# Patient Record
Sex: Female | Born: 1950
Health system: Southern US, Community
[De-identification: ages and names within clinical notes are randomized; demographics above are authoritative.]

## PROBLEM LIST (undated history)

## (undated) DIAGNOSIS — M51369 Other intervertebral disc degeneration, lumbar region without mention of lumbar back pain or lower extremity pain: Secondary | ICD-10-CM

## (undated) DIAGNOSIS — K219 Gastro-esophageal reflux disease without esophagitis: Secondary | ICD-10-CM

## (undated) DIAGNOSIS — K228 Other specified diseases of esophagus: Secondary | ICD-10-CM

## (undated) DIAGNOSIS — T7840XA Allergy, unspecified, initial encounter: Secondary | ICD-10-CM

## (undated) DIAGNOSIS — M5136 Other intervertebral disc degeneration, lumbar region: Secondary | ICD-10-CM

## (undated) DIAGNOSIS — E785 Hyperlipidemia, unspecified: Secondary | ICD-10-CM

## (undated) DIAGNOSIS — M810 Age-related osteoporosis without current pathological fracture: Secondary | ICD-10-CM

## (undated) HISTORY — DX: Allergy, unspecified, initial encounter: T78.40XA

## (undated) HISTORY — DX: Gastro-esophageal reflux disease without esophagitis: K21.9

## (undated) HISTORY — DX: Other specified diseases of esophagus: K22.8

## (undated) HISTORY — DX: Hyperlipidemia, unspecified: E78.5

## (undated) HISTORY — PX: EYE SURGERY: SHX253

## (undated) HISTORY — PX: NOSE SURGERY: SHX723

## (undated) HISTORY — DX: Other intervertebral disc degeneration, lumbar region: M51.36

## (undated) HISTORY — DX: Other intervertebral disc degeneration, lumbar region without mention of lumbar back pain or lower extremity pain: M51.369

## (undated) HISTORY — DX: Age-related osteoporosis without current pathological fracture: M81.0

---

## 2008-03-31 LAB — HM DEXA SCAN: HM Dexa Scan: NORMAL

## 2008-07-04 ENCOUNTER — Ambulatory Visit: Payer: Self-pay | Admitting: Gastroenterology

## 2008-07-04 LAB — HM COLONOSCOPY

## 2010-03-07 ENCOUNTER — Ambulatory Visit: Payer: Self-pay | Admitting: Family Medicine

## 2010-08-01 ENCOUNTER — Ambulatory Visit: Payer: Self-pay | Admitting: Family Medicine

## 2012-01-20 DIAGNOSIS — M5136 Other intervertebral disc degeneration, lumbar region: Secondary | ICD-10-CM | POA: Insufficient documentation

## 2012-02-04 LAB — HM MAMMOGRAPHY: HM Mammogram: NORMAL (ref 0–4)

## 2013-07-08 DIAGNOSIS — K2289 Other specified disease of esophagus: Secondary | ICD-10-CM

## 2013-07-08 HISTORY — DX: Other specified disease of esophagus: K22.89

## 2016-07-18 ENCOUNTER — Ambulatory Visit: Payer: Self-pay | Admitting: Family Medicine

## 2016-08-06 ENCOUNTER — Ambulatory Visit: Payer: Self-pay | Admitting: Family Medicine

## 2016-10-15 ENCOUNTER — Ambulatory Visit (INDEPENDENT_AMBULATORY_CARE_PROVIDER_SITE_OTHER): Payer: Medicare Other | Admitting: Family Medicine

## 2016-10-15 ENCOUNTER — Encounter: Payer: Self-pay | Admitting: Family Medicine

## 2016-10-15 VITALS — BP 136/82 | HR 60 | Temp 98.1°F | Ht 63.7 in | Wt 149.1 lb

## 2016-10-15 DIAGNOSIS — E782 Mixed hyperlipidemia: Secondary | ICD-10-CM | POA: Diagnosis not present

## 2016-10-15 DIAGNOSIS — H9202 Otalgia, left ear: Secondary | ICD-10-CM | POA: Diagnosis not present

## 2016-10-15 DIAGNOSIS — M810 Age-related osteoporosis without current pathological fracture: Secondary | ICD-10-CM | POA: Insufficient documentation

## 2016-10-15 DIAGNOSIS — L719 Rosacea, unspecified: Secondary | ICD-10-CM | POA: Insufficient documentation

## 2016-10-15 DIAGNOSIS — E785 Hyperlipidemia, unspecified: Secondary | ICD-10-CM | POA: Insufficient documentation

## 2016-10-15 DIAGNOSIS — F419 Anxiety disorder, unspecified: Secondary | ICD-10-CM

## 2016-10-15 DIAGNOSIS — B001 Herpesviral vesicular dermatitis: Secondary | ICD-10-CM | POA: Insufficient documentation

## 2016-10-15 DIAGNOSIS — K219 Gastro-esophageal reflux disease without esophagitis: Secondary | ICD-10-CM | POA: Diagnosis not present

## 2016-10-15 DIAGNOSIS — J309 Allergic rhinitis, unspecified: Secondary | ICD-10-CM | POA: Insufficient documentation

## 2016-10-15 NOTE — Assessment & Plan Note (Signed)
Stable on diet. Continue to monitor. Call with any concerns.

## 2016-10-15 NOTE — Assessment & Plan Note (Signed)
Stable with breathing exercises. Call with any concerns.

## 2016-10-15 NOTE — Assessment & Plan Note (Signed)
Not interested in oral meds right now. Continue to monitor.

## 2016-10-15 NOTE — Progress Notes (Signed)
BP 136/82 (BP Location: Left Arm, Patient Position: Sitting, Cuff Size: Normal)   Pulse 60   Temp 98.1 F (36.7 C)   Ht 5' 3.7" (1.618 m)   Wt 149 lb 1.6 oz (67.6 kg)   SpO2 98%   BMI 25.83 kg/m    Subjective:    Patient ID: Angie Copeland, female    DOB: 1951/03/04, 66 y.o.   MRN: [id number]  HPI: Angie Copeland is a 66 y.o. female who presents today to establish care.   Chief Complaint  Patient presents with  . New Patient (Initial Visit)    Patient will get records from previous doctors   EAR PAIN Duration: about a week Involved ear(s): left Severity:  mild  Quality:  sore Fever: no Otorrhea: no Upper respiratory infection symptoms: no Pruritus: no Hearing loss: no Water immersion no Using Q-tips: yes Recurrent otitis media: no Status: stable Treatments attempted: none  HYPERLIPIDEMIA Hyperlipidemia status: Stable Satisfied with current treatment?  yes Side effects:  Not on anything Past cholesterol meds: none Supplements: none Aspirin:  no Chest pain:  no Coronary artery disease:  no Family history CAD:  yes   Active Ambulatory Problems    Diagnosis Date Noted  . Rosacea 10/15/2016  . Allergic rhinitis 10/15/2016  . DDD (degenerative disc disease), lumbar 01/20/2012  . GE reflux 10/15/2016  . Hyperlipidemia, unspecified 10/15/2016  . Osteoporosis 10/15/2016  . Fever blister 10/15/2016  . Anxiety 10/15/2016   Resolved Ambulatory Problems    Diagnosis Date Noted  . No Resolved Ambulatory Problems   Past Medical History:  Diagnosis Date  . Allergy   . DDD (degenerative disc disease), lumbar   . GERD (gastroesophageal reflux disease)   . Hyperlipidemia   . Osteoporosis    No exam data present  No outpatient encounter prescriptions on file as of 10/15/2016.   No facility-administered encounter medications on file as of 10/15/2016.    Allergies  Allergen Reactions  . Penicillins Rash   Social History   Social History  . Marital status:  Married    Spouse name: N/A  . Number of children: N/A  . Years of education: N/A   Occupational History  . Not on file.   Social History Main Topics  . Smoking status: Former Smoker    Types: Cigarettes    Quit date: 07/09/1971  . Smokeless tobacco: Never Used  . Alcohol use 4.2 oz/week    7 Glasses of wine per week     Comment: 28oz of red wine a week  . Drug use: No  . Sexual activity: No   Other Topics Concern  . Not on file   Social History Narrative  . No narrative on file   Family History  Problem Relation Age of Onset  . Heart disease Mother   . Diabetes Mother   . Hiatal hernia Mother   . Cancer Father     Cancer  . Colitis Sister   . Irritable bowel syndrome Sister   . Arthritis Sister   . Diabetes Brother   . Stroke Maternal Grandmother   . Pneumonia Maternal Grandmother   . Kidney failure Paternal Grandmother   . Leukemia Paternal Grandfather   . Heart disease Sister   . Irregular heart beat Sister   . Thyroid disease Sister     Review of Systems  Constitutional: Negative.   HENT: Positive for ear pain. Negative for congestion, dental problem, drooling, ear discharge, facial swelling, hearing loss, mouth sores,  nosebleeds, postnasal drip, rhinorrhea, sinus pain, sinus pressure, sneezing, sore throat, tinnitus, trouble swallowing and voice change.   Respiratory: Negative.   Cardiovascular: Negative.   Psychiatric/Behavioral: Negative.     Per HPI unless specifically indicated above     Objective:    BP 136/82 (BP Location: Left Arm, Patient Position: Sitting, Cuff Size: Normal)   Pulse 60   Temp 98.1 F (36.7 C)   Ht 5' 3.7" (1.618 m)   Wt 149 lb 1.6 oz (67.6 kg)   SpO2 98%   BMI 25.83 kg/m   Wt Readings from Last 3 Encounters:  10/15/16 149 lb 1.6 oz (67.6 kg)    Physical Exam  Constitutional: She is oriented to person, place, and time. She appears well-developed and well-nourished. No distress.  HENT:  Head: Normocephalic and  atraumatic.  Right Ear: Hearing and external ear normal.  Left Ear: Hearing and external ear normal.  Nose: Nose normal.  Mouth/Throat: Oropharynx is clear and moist. No oropharyngeal exudate.  Fever blister on R side of her mouth, very dry EACs bilaterally   Eyes: Conjunctivae, EOM and lids are normal. Pupils are equal, round, and reactive to light. Right eye exhibits no discharge. Left eye exhibits no discharge. No scleral icterus.  Neck: Normal range of motion. Neck supple. No JVD present. No tracheal deviation present. No thyromegaly present.  Cardiovascular: Normal rate, regular rhythm, normal heart sounds and intact distal pulses.  Exam reveals no gallop and no friction rub.   No murmur heard. Pulmonary/Chest: Effort normal and breath sounds normal. No stridor. No respiratory distress. She has no wheezes. She has no rales. She exhibits no tenderness.  Musculoskeletal: Normal range of motion.  Lymphadenopathy:    She has no cervical adenopathy.  Neurological: She is alert and oriented to person, place, and time.  Skin: Skin is warm, dry and intact. No rash noted. She is not diaphoretic. No erythema. No pallor.  Psychiatric: She has a normal mood and affect. Her speech is normal and behavior is normal. Judgment and thought content normal. Cognition and memory are normal.  Nursing note and vitals reviewed.   No results found for this or any previous visit.    Assessment & Plan:   Problem List Items Addressed This Visit      Digestive   GE reflux    Stable on diet. Continue to monitor. Call with any concerns.       Fever blister    Not interested in oral meds right now. Continue to monitor.         Other   Hyperlipidemia, unspecified    Will obtain records from previous provider, continue diet and exercise. Call with any concerns.       Anxiety    Stable with breathing exercises. Call with any concerns.        Other Visit Diagnoses    Left ear pain    -  Primary    Very dry EAC, will use sweet oil. Call with any concerns.        Follow up plan: Return 2-3 months Wellness, for Records release please.

## 2016-10-15 NOTE — Patient Instructions (Addendum)
Food Choices for Gastroesophageal Reflux Disease, Adult When you have gastroesophageal reflux disease (GERD), the foods you eat and your eating habits are very important. Choosing the right foods can help ease the discomfort of GERD. Consider working with a diet and nutrition specialist (dietitian) to help you make healthy food choices. What general guidelines should I follow? Eating plan  Choose healthy foods low in fat, such as fruits, vegetables, whole grains, low-fat dairy products, and lean meat, fish, and poultry.  Eat frequent, small meals instead of three large meals each day. Eat your meals slowly, in a relaxed setting. Avoid bending over or lying down until 2-3 hours after eating.  Limit high-fat foods such as fatty meats or fried foods.  Limit your intake of oils, butter, and shortening to less than 8 teaspoons each day.  Avoid the following: ? Foods that cause symptoms. These may be different for different people. Keep a food diary to keep track of foods that cause symptoms. ? Alcohol. ? Drinking large amounts of liquid with meals. ? Eating meals during the 2-3 hours before bed.  Cook foods using methods other than frying. This may include baking, grilling, or broiling. Lifestyle   Maintain a healthy weight. Ask your health care provider what weight is healthy for you. If you need to lose weight, work with your health care provider to do so safely.  Exercise for at least 30 minutes on 5 or more days each week, or as told by your health care provider.  Avoid wearing clothes that fit tightly around your waist and chest.  Do not use any products that contain nicotine or tobacco, such as cigarettes and e-cigarettes. If you need help quitting, ask your health care provider.  Sleep with the head of your bed raised. Use a wedge under the mattress or blocks under the bed frame to raise the head of the bed. What foods are not recommended? The items listed may not be a complete  list. Talk with your dietitian about what dietary choices are best for you. Grains Pastries or quick breads with added fat. French toast. Vegetables Deep fried vegetables. French fries. Any vegetables prepared with added fat. Any vegetables that cause symptoms. For some people this may include tomatoes and tomato products, chili peppers, onions and garlic, and horseradish. Fruits Any fruits prepared with added fat. Any fruits that cause symptoms. For some people this may include citrus fruits, such as oranges, grapefruit, pineapple, and lemons. Meats and other protein foods High-fat meats, such as fatty beef or pork, hot dogs, ribs, ham, sausage, salami and bacon. Fried meat or protein, including fried fish and fried chicken. Nuts and nut butters. Dairy Whole milk and chocolate milk. Sour cream. Cream. Ice cream. Cream cheese. Milk shakes. Beverages Coffee and tea, with or without caffeine. Carbonated beverages. Sodas. Energy drinks. Fruit juice made with acidic fruits (such as orange or grapefruit). Tomato juice. Alcoholic drinks. Fats and oils Butter. Margarine. Shortening. Ghee. Sweets and desserts Chocolate and cocoa. Donuts. Seasoning and other foods Pepper. Peppermint and spearmint. Any condiments, herbs, or seasonings that cause symptoms. For some people, this may include curry, hot sauce, or vinegar-based salad dressings. Summary  When you have gastroesophageal reflux disease (GERD), food and lifestyle choices are very important to help ease the discomfort of GERD.  Eat frequent, small meals instead of three large meals each day. Eat your meals slowly, in a relaxed setting. Avoid bending over or lying down until 2-3 hours after eating.  Limit high-fat   foods such as fatty meat or fried foods. This information is not intended to replace advice given to you by your health care provider. Make sure you discuss any questions you have with your health care provider. Document Released:  06/24/2005 Document Revised: 06/25/2016 Document Reviewed: 06/25/2016 Elsevier Interactive Patient Education  2017 [employer]  

## 2016-10-15 NOTE — Assessment & Plan Note (Signed)
Will obtain records from previous provider, continue diet and exercise. Call with any concerns.

## 2016-11-21 ENCOUNTER — Encounter: Payer: Self-pay | Admitting: Family Medicine

## 2016-11-21 ENCOUNTER — Telehealth: Payer: Self-pay | Admitting: Family Medicine

## 2016-11-21 NOTE — Telephone Encounter (Signed)
Called pt to schedule Annual Wellness Visit with Nurse Health Advisor for this year she just had established care appt in April declined AWV for this year:  - knb

## 2016-12-17 ENCOUNTER — Encounter: Payer: Self-pay | Admitting: Family Medicine

## 2016-12-17 ENCOUNTER — Ambulatory Visit (INDEPENDENT_AMBULATORY_CARE_PROVIDER_SITE_OTHER): Payer: Medicare Other | Admitting: Family Medicine

## 2016-12-17 VITALS — BP 144/75 | HR 67 | Temp 97.8°F | Ht 64.1 in | Wt 147.7 lb

## 2016-12-17 DIAGNOSIS — F419 Anxiety disorder, unspecified: Secondary | ICD-10-CM

## 2016-12-17 DIAGNOSIS — Z7189 Other specified counseling: Secondary | ICD-10-CM

## 2016-12-17 DIAGNOSIS — K219 Gastro-esophageal reflux disease without esophagitis: Secondary | ICD-10-CM | POA: Diagnosis not present

## 2016-12-17 DIAGNOSIS — R252 Cramp and spasm: Secondary | ICD-10-CM

## 2016-12-17 DIAGNOSIS — Z0000 Encounter for general adult medical examination without abnormal findings: Secondary | ICD-10-CM | POA: Diagnosis not present

## 2016-12-17 DIAGNOSIS — M810 Age-related osteoporosis without current pathological fracture: Secondary | ICD-10-CM | POA: Diagnosis not present

## 2016-12-17 DIAGNOSIS — Z23 Encounter for immunization: Secondary | ICD-10-CM

## 2016-12-17 DIAGNOSIS — Z1239 Encounter for other screening for malignant neoplasm of breast: Secondary | ICD-10-CM

## 2016-12-17 DIAGNOSIS — Z1231 Encounter for screening mammogram for malignant neoplasm of breast: Secondary | ICD-10-CM

## 2016-12-17 DIAGNOSIS — E782 Mixed hyperlipidemia: Secondary | ICD-10-CM

## 2016-12-17 LAB — UA/M W/RFLX CULTURE, ROUTINE
BILIRUBIN UA: NEGATIVE
GLUCOSE, UA: NEGATIVE
Ketones, UA: NEGATIVE
Leukocytes, UA: NEGATIVE
Nitrite, UA: NEGATIVE
PROTEIN UA: NEGATIVE
RBC UA: NEGATIVE
Specific Gravity, UA: <1.005 — ABNORMAL LOW (ref 1.005–1.030)
UUROB: 0.2 mg/dL (ref 0.2–1.0)
pH, UA: 5.5 (ref 5.0–7.5)

## 2016-12-17 LAB — MICROSCOPIC EXAMINATION
Bacteria, UA: NONE SEEN
RBC MICROSCOPIC, UA: NONE SEEN /HPF (ref 0–?)

## 2016-12-17 NOTE — Assessment & Plan Note (Signed)
Will recheck her DEXA as the last one was in 2009. Ordered today.

## 2016-12-17 NOTE — Assessment & Plan Note (Signed)
Rechecking levels today. Await results. Will treat as needed.  

## 2016-12-17 NOTE — Progress Notes (Signed)
BP (!) 144/75 (BP Location: Left Arm, Patient Position: Sitting, Cuff Size: Normal)   Pulse 67   Temp 97.8 F (36.6 C)   Ht 5' 4.1" (1.628 m)   Wt 147 lb 11.2 oz (67 kg)   SpO2 100%   BMI 25.27 kg/m    Subjective:    Patient ID: Angie Copeland, female    DOB: 07/04/1951, 66 y.o.   MRN: [id number]  HPI: Angie Copeland is a 66 y.o. female presenting on 12/17/2016 for comprehensive medical examination patient. Current medical complaints include:  LEG CRAMPS- sometimes wakes up in the night and has leg cramps, sometimes it happens during the day. She notes that she has a bunion and wart on one foot and isn't sure if her walk has been normal Duration: 8 weeks off and on- has happened about 5 times in the last 2 months Pain: yes Severity: severe  Quality:  cramping Location: L lower leg  Bilateral:  no Onset: sudden Frequency: intermittent Time of  day:   at random Sudden unintentional leg jerking:   no Paresthesias:   yes- only in her hands, not in her leg Decreased sensation:  no Weakness:   no Insomnia:   no Fatigue:   no Alleviating factors: walking, moving Aggravating factors: unknown Status: fluctuating Treatments attempted: walking- usually takes a couple of minutes to go away  She currently lives with: alone Menopausal Symptoms: no  Functional Status Survey: Is the patient deaf or have difficulty hearing?: Yes (when in a crowd, or a lot of noise) Does the patient have difficulty seeing, even when wearing glasses/contacts?: Yes (Patient is going to go see her eye doctor) Does the patient have difficulty concentrating, remembering, or making decisions?: No Does the patient have difficulty walking or climbing stairs?: No Does the patient have difficulty dressing or bathing?: No Does the patient have difficulty doing errands alone such as visiting a doctor's office or shopping?: No  Fall Risk  12/17/2016 10/15/2016  Falls in the past year? Yes No  Number falls in past yr:  1 -  Injury with Fall? No -    Depression Screen Depression screen [facility] 2/9 12/17/2016 10/15/2016  Decreased Interest 0 0  Down, Depressed, Hopeless 0 0  PHQ - 2 Score 0 0   Advanced Directives Does patient have a HCPOA?    yes Does patient have a living will or MOST form?  yes  Past Medical History:  Past Medical History:  Diagnosis Date  . Allergy   . DDD (degenerative disc disease), lumbar   . Esophageal mass 2015   Found incidently on CXR- Saw ENT and for visualization and nothing was there.   Marland Kitchen GERD (gastroesophageal reflux disease)   . Hyperlipidemia   . Osteoporosis     Surgical History:  Past Surgical History:  Procedure Laterality Date  . NOSE SURGERY     Cartilage removed from left side of nose    Medications:  No current outpatient prescriptions on file prior to visit.   No current facility-administered medications on file prior to visit.     Allergies:  Allergies  Allergen Reactions  . Penicillins Rash    Social History:  Social History   Social History  . Marital status: Married    Spouse name: N/A  . Number of children: N/A  . Years of education: N/A   Occupational History  . Not on file.   Social History Main Topics  . Smoking status: Former Smoker  Types: Cigarettes    Quit date: 07/09/1971  . Smokeless tobacco: Never Used  . Alcohol use 4.2 oz/week    7 Glasses of wine per week     Comment: 28oz of red wine a week  . Drug use: No  . Sexual activity: No   Other Topics Concern  . Not on file   Social History Narrative  . No narrative on file   History  Smoking Status  . Former Smoker  . Types: Cigarettes  . Quit date: 07/09/1971  Smokeless Tobacco  . Never Used   History  Alcohol Use  . 4.2 oz/week  . 7 Glasses of wine per week    Comment: 28oz of red wine a week    Family History:  Family History  Problem Relation Age of Onset  . Heart disease Mother   . Diabetes Mother   . Hiatal hernia Mother   . Arthritis  Mother   . Cancer Father 65       Colon  . Colitis Sister   . Irritable bowel syndrome Sister   . Arthritis Sister   . Diabetes Brother   . Stroke Maternal Grandmother   . Pneumonia Maternal Grandmother   . Kidney failure Paternal Grandmother   . Leukemia Paternal Grandfather   . Cancer Paternal Grandfather        Leukemia  . Heart disease Sister   . Irregular heart beat Sister   . Thyroid disease Sister    Past medical history, surgical history, medications, allergies, family history and social history reviewed with patient today and changes made to appropriate areas of the chart.   Review of Systems  Constitutional: Negative.   HENT: Positive for hearing loss. Negative for congestion, ear discharge, ear pain, nosebleeds, sinus pain, sore throat and tinnitus.   Eyes: Positive for blurred vision (foggy when she first wakes up in the AM, better when she puts her contacts in. ). Negative for double vision, photophobia, pain, discharge and redness.  Respiratory: Negative.  Negative for stridor.   Cardiovascular: Negative.   Gastrointestinal: Positive for diarrhea and heartburn (with dietary indiscretion). Negative for abdominal pain, blood in stool, constipation, melena, nausea and vomiting.  Genitourinary: Negative.   Musculoskeletal: Negative.   Skin: Negative.   Neurological: Positive for tingling. Negative for dizziness, tremors, sensory change, speech change, focal weakness, seizures, loss of consciousness and headaches.  Endo/Heme/Allergies: Positive for environmental allergies. Negative for polydipsia. Bruises/bleeds easily.  Psychiatric/Behavioral: Negative for depression, hallucinations, memory loss, substance abuse and suicidal ideas. The patient is nervous/anxious. The patient does not have insomnia.     All other ROS negative except what is listed above and in the HPI.      Objective:    BP (!) 144/75 (BP Location: Left Arm, Patient Position: Sitting, Cuff Size:  Normal)   Pulse 67   Temp 97.8 F (36.6 C)   Ht 5' 4.1" (1.628 m)   Wt 147 lb 11.2 oz (67 kg)   SpO2 100%   BMI 25.27 kg/m   Wt Readings from Last 3 Encounters:  12/17/16 147 lb 11.2 oz (67 kg)  10/15/16 149 lb 1.6 oz (67.6 kg)     Hearing Screening   125Hz  250Hz  500Hz  1000Hz  2000Hz  3000Hz  4000Hz  6000Hz  8000Hz   Right ear:   20 20 20  20     Left ear:   20 20 20  20       Visual Acuity Screening   Right eye Left eye Both eyes  Without  correction:     With correction: 20/50 20/200 20/40    Physical Exam  Constitutional: She is oriented to person, place, and time. She appears well-developed and well-nourished. No distress.  HENT:  Head: Normocephalic and atraumatic.  Right Ear: Hearing, tympanic membrane, external ear and ear canal normal.  Left Ear: Hearing, tympanic membrane, external ear and ear canal normal.  Nose: Nose normal.  Mouth/Throat: Uvula is midline, oropharynx is clear and moist and mucous membranes are normal. No oropharyngeal exudate.  Eyes: Conjunctivae, EOM and lids are normal. Pupils are equal, round, and reactive to light. Right eye exhibits no discharge. Left eye exhibits no discharge. No scleral icterus.  Neck: Normal range of motion. Neck supple. No JVD present. No tracheal deviation present. No thyromegaly present.  Cardiovascular: Normal rate, regular rhythm, normal heart sounds and intact distal pulses.  Exam reveals no gallop and no friction rub.   No murmur heard. Pulmonary/Chest: Effort normal and breath sounds normal. No stridor. No respiratory distress. She has no wheezes. She has no rales. She exhibits no tenderness. Right breast exhibits no inverted nipple, no mass, no nipple discharge, no skin change and no tenderness. Left breast exhibits no inverted nipple, no mass, no nipple discharge, no skin change and no tenderness. Breasts are symmetrical.  Abdominal: Soft. Bowel sounds are normal. She exhibits no distension and no mass. There is no  tenderness. There is no rebound and no guarding.  Genitourinary:  Genitourinary Comments: Pelvic exam deferred with shared decision making  Musculoskeletal: Normal range of motion. She exhibits no edema, tenderness or deformity.  Lymphadenopathy:    She has no cervical adenopathy.  Neurological: She is alert and oriented to person, place, and time. She has normal reflexes. She displays normal reflexes. No cranial nerve deficit. She exhibits normal muscle tone. Coordination normal.  Skin: Skin is warm, dry and intact. No rash noted. She is not diaphoretic. No erythema. No pallor.  Psychiatric: She has a normal mood and affect. Her speech is normal and behavior is normal. Judgment and thought content normal. Cognition and memory are normal.  Nursing note and vitals reviewed.  6CIT Screen 12/17/2016  What Year? 0 points  What month? 0 points  What time? 0 points  Count back from 20 2 points  Months in reverse 0 points  Repeat phrase 0 points  Total Score 2    Results for orders placed or performed in visit on 11/21/16  HM MAMMOGRAPHY  Result Value Ref Range   HM Mammogram Self Reported Normal 0-4 Bi-Rad, Self Reported Normal  HM DEXA SCAN  Result Value Ref Range   HM Dexa Scan Normal   HM COLONOSCOPY  Result Value Ref Range   HM Colonoscopy Patient Reported See Report (in chart), Patient Reported      Assessment & Plan:   Problem List Items Addressed This Visit      Digestive   GE reflux    Stable- only with dietary indiscretions. Call with any concerns.       Relevant Orders   CBC with Differential/Platelet   Comprehensive metabolic panel   UA/M w/rflx Culture, Routine     Musculoskeletal and Integument   Osteoporosis    Will recheck her DEXA as the last one was in 2009. Ordered today.      Relevant Orders   Comprehensive metabolic panel   TSH   UA/M w/rflx Culture, Routine   VITAMIN D 25 Hydroxy (Vit-D Deficiency, Fractures)   DG Bone Density  Other    Hyperlipidemia, unspecified    Rechecking levels today. Await results. Will treat as needed.       Relevant Orders   Comprehensive metabolic panel   Lipid Panel w/o Chol/HDL Ratio   UA/M w/rflx Culture, Routine   Anxiety    Stable. Continue breathing exercises. Call with any concerns.       Relevant Orders   Comprehensive metabolic panel   TSH   UA/M w/rflx Culture, Routine   Advance directive discussed with patient    Has one on file. Will bring in a copy so we can put it on file.        Other Visit Diagnoses    Medicare annual wellness visit, subsequent    -  Primary   Preventative care discussed today as below.    Routine general medical examination at a health care facility       Vaccines updated. Screening labs checked today. Pap N/A. Mammogram and DEXA ordered today. Colonoscopy up to date. Continue diet and exercise.    Screening for breast cancer       Mammogram ordered today. Await results.    Relevant Orders   MM DIGITAL SCREENING BILATERAL   Immunization due       Prevnar given today, Pneumovax due in 1 year.    Leg cramps       Will check labs today. Increase water intake. Call with any concerns. Await results.       Preventative Services:  Health Risk Assessment and Personalized Prevention Plan: Done today Bone Mass Measurements: Ordered today Breast Cancer Screening: Ordered today CVD Screening: done today Cervical Cancer Screening: N/A Colon Cancer Screening: Up to date Depression Screening: Done today Diabetes Screening: Done today  Glaucoma Screening: See your Eye Doctor Hepatitis B vaccine: N/A Hepatitis C screening: Declined HIV Screening: Declined Flu Vaccine: Get in October Lung cancer Screening: N/A Obesity Screening: Done today Pneumonia Vaccines (2): Prevnar given today. Pneumovax due in 1 year STI Screening: N/A  Follow up plan: Return in about 1 year (around 12/17/2017) for Wellness.   LABORATORY TESTING:  - Pap smear: not  applicable  IMMUNIZATIONS:   - Tdap: Tetanus vaccination status reviewed: last tetanus booster within 10 years. - Influenza: Up to date - Pneumovax: Not applicable - Prevnar: Administered today  SCREENING: -Mammogram: Ordered today  - Colonoscopy: Up to date  - Bone Density: Ordered today  -Hearing Test: Refused   PATIENT COUNSELING:   Advised to take 1 mg of folate supplement per day if capable of pregnancy.   Sexuality: Discussed sexually transmitted diseases, partner selection, use of condoms, avoidance of unintended pregnancy  and contraceptive alternatives.   Advised to avoid cigarette smoking.  I discussed with the patient that most people either abstain from alcohol or drink within safe limits (<=14/week and <=4 drinks/occasion for males, <=7/weeks and <= 3 drinks/occasion for females) and that the risk for alcohol disorders and other health effects rises proportionally with the number of drinks per week and how often a drinker exceeds daily limits.  Discussed cessation/primary prevention of drug use and availability of treatment for abuse.   Diet: Encouraged to adjust caloric intake to maintain  or achieve ideal body weight, to reduce intake of dietary saturated fat and total fat, to limit sodium intake by avoiding high sodium foods and not adding table salt, and to maintain adequate dietary potassium and calcium preferably from fresh fruits, vegetables, and low-fat dairy products.    stressed the importance of  regular exercise  Injury prevention: Discussed safety belts, safety helmets, smoke detector, smoking near bedding or upholstery.   Dental health: Discussed importance of regular tooth brushing, flossing, and dental visits.    NEXT PREVENTATIVE PHYSICAL DUE IN 1 YEAR. Return in about 1 year (around 12/17/2017) for Wellness.

## 2016-12-17 NOTE — Assessment & Plan Note (Signed)
Has one on file. Will bring in a copy so we can put it on file.

## 2016-12-17 NOTE — Patient Instructions (Addendum)
Preventative Services:  Health Risk Assessment and Personalized Prevention Plan: Done today Bone Mass Measurements: Ordered today Breast Cancer Screening: Ordered today CVD Screening: done today Cervical Cancer Screening: N/A Colon Cancer Screening: Up to date Depression Screening: Done today Diabetes Screening: Done today  Glaucoma Screening: See your Eye Doctor Hepatitis B vaccine: N/A Hepatitis C screening: Declined HIV Screening: Declined Flu Vaccine: Get in October Lung cancer Screening: N/A Obesity Screening: Done today Pneumonia Vaccines (2): Prevnar given today. Pneumovax due in 1 year STI Screening: N/A Health Maintenance, Female Adopting a healthy lifestyle and getting preventive care can go a long way to promote health and wellness. Talk with your health care provider about what schedule of regular examinations is right for you. This is a good chance for you to check in with your provider about disease prevention and staying healthy. In between checkups, there are plenty of things you can do on your own. Experts have done a lot of research about which lifestyle changes and preventive measures are most likely to keep you healthy. Ask your health care provider for more information. Weight and diet Eat a healthy diet  Be sure to include plenty of vegetables, fruits, low-fat dairy products, and lean protein.  Do not eat a lot of foods high in solid fats, added sugars, or salt.  Get regular exercise. This is one of the most important things you can do for your health. ? Most adults should exercise for at least 150 minutes each week. The exercise should increase your heart rate and make you sweat (moderate-intensity exercise). ? Most adults should also do strengthening exercises at least twice a week. This is in addition to the moderate-intensity exercise.  Maintain a healthy weight  Body mass index (BMI) is a measurement that can be used to identify possible weight problems.  It estimates body fat based on height and weight. Your health care provider can help determine your BMI and help you achieve or maintain a healthy weight.  For females 21 years of age and older: ? A BMI below 18.5 is considered underweight. ? A BMI of 18.5 to 24.9 is normal. ? A BMI of 25 to 29.9 is considered overweight. ? A BMI of 30 and above is considered obese.  Watch levels of cholesterol and blood lipids  You should start having your blood tested for lipids and cholesterol at 67 years of age, then have this test every 5 years.  You may need to have your cholesterol levels checked more often if: ? Your lipid or cholesterol levels are high. ? You are older than 68 years of age. ? You are at high risk for heart disease.  Cancer screening Lung Cancer  Lung cancer screening is recommended for adults 44-29 years old who are at high risk for lung cancer because of a history of smoking.  A yearly low-dose CT scan of the lungs is recommended for people who: ? Currently smoke. ? Have quit within the past 15 years. ? Have at least a 30-pack-year history of smoking. A pack year is smoking an average of one pack of cigarettes a day for 1 year.  Yearly screening should continue until it has been 15 years since you quit.  Yearly screening should stop if you develop a health problem that would prevent you from having lung cancer treatment.  Breast Cancer  Practice breast self-awareness. This means understanding how your breasts normally appear and feel.  It also means doing regular breast self-exams. Let your health  care provider know about any changes, no matter how small.  If you are in your 20s or 30s, you should have a clinical breast exam (CBE) by a health care provider every 1-3 years as part of a regular health exam.  If you are 45 or older, have a CBE every year. Also consider having a breast X-ray (mammogram) every year.  If you have a family history of breast cancer, talk to  your health care provider about genetic screening.  If you are at high risk for breast cancer, talk to your health care provider about having an MRI and a mammogram every year.  Breast cancer gene (BRCA) assessment is recommended for women who have family members with BRCA-related cancers. BRCA-related cancers include: ? Breast. ? Ovarian. ? Tubal. ? Peritoneal cancers.  Results of the assessment will determine the need for genetic counseling and BRCA1 and BRCA2 testing.  Cervical Cancer Your health care provider may recommend that you be screened regularly for cancer of the pelvic organs (ovaries, uterus, and vagina). This screening involves a pelvic examination, including checking for microscopic changes to the surface of your cervix (Pap test). You may be encouraged to have this screening done every 3 years, beginning at age 55.  For women ages 9-65, health care providers may recommend pelvic exams and Pap testing every 3 years, or they may recommend the Pap and pelvic exam, combined with testing for human papilloma virus (HPV), every 5 years. Some types of HPV increase your risk of cervical cancer. Testing for HPV may also be done on women of any age with unclear Pap test results.  Other health care providers may not recommend any screening for nonpregnant women who are considered low risk for pelvic cancer and who do not have symptoms. Ask your health care provider if a screening pelvic exam is right for you.  If you have had past treatment for cervical cancer or a condition that could lead to cancer, you need Pap tests and screening for cancer for at least 20 years after your treatment. If Pap tests have been discontinued, your risk factors (such as having a new sexual partner) need to be reassessed to determine if screening should resume. Some women have medical problems that increase the chance of getting cervical cancer. In these cases, your health care provider may recommend more  frequent screening and Pap tests.  Colorectal Cancer  This type of cancer can be detected and often prevented.  Routine colorectal cancer screening usually begins at 68 years of age and continues through 68 years of age.  Your health care provider may recommend screening at an earlier age if you have risk factors for colon cancer.  Your health care provider may also recommend using home test kits to check for hidden blood in the stool.  A small camera at the end of a tube can be used to examine your colon directly (sigmoidoscopy or colonoscopy). This is done to check for the earliest forms of colorectal cancer.  Routine screening usually begins at age 73.  Direct examination of the colon should be repeated every 5-10 years through 68 years of age. However, you may need to be screened more often if early forms of precancerous polyps or small growths are found.  Skin Cancer  Check your skin from head to toe regularly.  Tell your health care provider about any new moles or changes in moles, especially if there is a change in a mole's shape or color.  Also tell  your health care provider if you have a mole that is larger than the size of a pencil eraser.  Always use sunscreen. Apply sunscreen liberally and repeatedly throughout the day.  Protect yourself by wearing long sleeves, pants, a wide-brimmed hat, and sunglasses whenever you are outside.  Heart disease, diabetes, and high blood pressure  High blood pressure causes heart disease and increases the risk of stroke. High blood pressure is more likely to develop in: ? People who have blood pressure in the high end of the normal range (130-139/85-89 mm Hg). ? People who are overweight or obese. ? People who are African American.  If you are 66-32 years of age, have your blood pressure checked every 3-5 years. If you are 1 years of age or older, have your blood pressure checked every year. You should have your blood pressure measured  twice-once when you are at a hospital or clinic, and once when you are not at a hospital or clinic. Record the average of the two measurements. To check your blood pressure when you are not at a hospital or clinic, you can use: ? An automated blood pressure machine at a pharmacy. ? A home blood pressure monitor.  If you are between 51 years and 56 years old, ask your health care provider if you should take aspirin to prevent strokes.  Have regular diabetes screenings. This involves taking a blood sample to check your fasting blood sugar level. ? If you are at a normal weight and have a low risk for diabetes, have this test once every three years after 68 years of age. ? If you are overweight and have a high risk for diabetes, consider being tested at a younger age or more often. Preventing infection Hepatitis B  If you have a higher risk for hepatitis B, you should be screened for this virus. You are considered at high risk for hepatitis B if: ? You were born in a country where hepatitis B is common. Ask your health care provider which countries are considered high risk. ? Your parents were born in a high-risk country, and you have not been immunized against hepatitis B (hepatitis B vaccine). ? You have HIV or AIDS. ? You use needles to inject street drugs. ? You live with someone who has hepatitis B. ? You have had sex with someone who has hepatitis B. ? You get hemodialysis treatment. ? You take certain medicines for conditions, including cancer, organ transplantation, and autoimmune conditions.  Hepatitis C  Blood testing is recommended for: ? Everyone born from [birth year] through 1967. ? Anyone with known risk factors for hepatitis C.  Sexually transmitted infections (STIs)  You should be screened for sexually transmitted infections (STIs) including gonorrhea and chlamydia if: ? You are sexually active and are younger than 68 years of age. ? You are older than 68 years of age and your  health care provider tells you that you are at risk for this type of infection. ? Your sexual activity has changed since you were last screened and you are at an increased risk for chlamydia or gonorrhea. Ask your health care provider if you are at risk.  If you do not have HIV, but are at risk, it may be recommended that you take a prescription medicine daily to prevent HIV infection. This is called pre-exposure prophylaxis (PrEP). You are considered at risk if: ? You are sexually active and do not regularly use condoms or know the HIV status of your partner(s). ?  You take drugs by injection. ? You are sexually active with a partner who has HIV.  Talk with your health care provider about whether you are at high risk of being infected with HIV. If you choose to begin PrEP, you should first be tested for HIV. You should then be tested every 3 months for as long as you are taking PrEP. Pregnancy  If you are premenopausal and you may become pregnant, ask your health care provider about preconception counseling.  If you may become pregnant, take 400 to 800 micrograms (mcg) of folic acid every day.  If you want to prevent pregnancy, talk to your health care provider about birth control (contraception). Osteoporosis and menopause  Osteoporosis is a disease in which the bones lose minerals and strength with aging. This can result in serious bone fractures. Your risk for osteoporosis can be identified using a bone density scan.  If you are 43 years of age or older, or if you are at risk for osteoporosis and fractures, ask your health care provider if you should be screened.  Ask your health care provider whether you should take a calcium or vitamin D supplement to lower your risk for osteoporosis.  Menopause may have certain physical symptoms and risks.  Hormone replacement therapy may reduce some of these symptoms and risks. Talk to your health care provider about whether hormone replacement  therapy is right for you. Follow these instructions at home:  Schedule regular health, dental, and eye exams.  Stay current with your immunizations.  Do not use any tobacco products including cigarettes, chewing tobacco, or electronic cigarettes.  If you are pregnant, do not drink alcohol.  If you are breastfeeding, limit how much and how often you drink alcohol.  Limit alcohol intake to no more than 1 drink per day for nonpregnant women. One drink equals 12 ounces of beer, 5 ounces of wine, or 1 ounces of hard liquor.  Do not use street drugs.  Do not share needles.  Ask your health care provider for help if you need support or information about quitting drugs.  Tell your health care provider if you often feel depressed.  Tell your health care provider if you have ever been abused or do not feel safe at home. This information is not intended to replace advice given to you by your health care provider. Make sure you discuss any questions you have with your health care provider. Document Released: 01/07/2011 Document Revised: 11/30/2015 Document Reviewed: 03/28/2015 Elsevier Interactive Patient Education  2018 [place] Maintenance for Postmenopausal Women Menopause is a normal process in which your reproductive ability comes to an end. This process happens gradually over a span of months to years, usually between the ages of 45 and 20. Menopause is complete when you have missed 12 consecutive menstrual periods. It is important to talk with your health care provider about some of the most common conditions that affect postmenopausal women, such as heart disease, cancer, and bone loss (osteoporosis). Adopting a healthy lifestyle and getting preventive care can help to promote your health and wellness. Those actions can also lower your chances of developing some of these common conditions. What should I know about menopause? During menopause, you may experience a number of  symptoms, such as:  Moderate-to-severe hot flashes.  Night sweats.  Decrease in sex drive.  Mood swings.  Headaches.  Tiredness.  Irritability.  Memory problems.  Insomnia.  Choosing to treat or not to treat menopausal changes is  an individual decision that you make with your health care provider. What should I know about hormone replacement therapy and supplements? Hormone therapy products are effective for treating symptoms that are associated with menopause, such as hot flashes and night sweats. Hormone replacement carries certain risks, especially as you become older. If you are thinking about using estrogen or estrogen with progestin treatments, discuss the benefits and risks with your health care provider. What should I know about heart disease and stroke? Heart disease, heart attack, and stroke become more likely as you age. This may be due, in part, to the hormonal changes that your body experiences during menopause. These can affect how your body processes dietary fats, triglycerides, and cholesterol. Heart attack and stroke are both medical emergencies. There are many things that you can do to help prevent heart disease and stroke:  Have your blood pressure checked at least every 1-2 years. High blood pressure causes heart disease and increases the risk of stroke.  If you are 59-45 years old, ask your health care provider if you should take aspirin to prevent a heart attack or a stroke.  Do not use any tobacco products, including cigarettes, chewing tobacco, or electronic cigarettes. If you need help quitting, ask your health care provider.  It is important to eat a healthy diet and maintain a healthy weight. ? Be sure to include plenty of vegetables, fruits, low-fat dairy products, and lean protein. ? Avoid eating foods that are high in solid fats, added sugars, or salt (sodium).  Get regular exercise. This is one of the most important things that you can do for your  health. ? Try to exercise for at least 150 minutes each week. The type of exercise that you do should increase your heart rate and make you sweat. This is known as moderate-intensity exercise. ? Try to do strengthening exercises at least twice each week. Do these in addition to the moderate-intensity exercise.  Know your numbers.Ask your health care provider to check your cholesterol and your blood glucose. Continue to have your blood tested as directed by your health care provider.  What should I know about cancer screening? There are several types of cancer. Take the following steps to reduce your risk and to catch any cancer development as early as possible. Breast Cancer  Practice breast self-awareness. ? This means understanding how your breasts normally appear and feel. ? It also means doing regular breast self-exams. Let your health care provider know about any changes, no matter how small.  If you are 31 or older, have a clinician do a breast exam (clinical breast exam or CBE) every year. Depending on your age, family history, and medical history, it may be recommended that you also have a yearly breast X-ray (mammogram).  If you have a family history of breast cancer, talk with your health care provider about genetic screening.  If you are at high risk for breast cancer, talk with your health care provider about having an MRI and a mammogram every year.  Breast cancer (BRCA) gene test is recommended for women who have family members with BRCA-related cancers. Results of the assessment will determine the need for genetic counseling and BRCA1 and for BRCA2 testing. BRCA-related cancers include these types: ? Breast. This occurs in males or females. ? Ovarian. ? Tubal. This may also be called fallopian tube cancer. ? Cancer of the abdominal or pelvic lining (peritoneal cancer). ? Prostate. ? Pancreatic.  Cervical, Uterine, and Ovarian Cancer Your health  care provider may recommend  that you be screened regularly for cancer of the pelvic organs. These include your ovaries, uterus, and vagina. This screening involves a pelvic exam, which includes checking for microscopic changes to the surface of your cervix (Pap test).  For women ages 21-65, health care providers may recommend a pelvic exam and a Pap test every three years. For women ages 31-65, they may recommend the Pap test and pelvic exam, combined with testing for human papilloma virus (HPV), every five years. Some types of HPV increase your risk of cervical cancer. Testing for HPV may also be done on women of any age who have unclear Pap test results.  Other health care providers may not recommend any screening for nonpregnant women who are considered low risk for pelvic cancer and have no symptoms. Ask your health care provider if a screening pelvic exam is right for you.  If you have had past treatment for cervical cancer or a condition that could lead to cancer, you need Pap tests and screening for cancer for at least 20 years after your treatment. If Pap tests have been discontinued for you, your risk factors (such as having a new sexual partner) need to be reassessed to determine if you should start having screenings again. Some women have medical problems that increase the chance of getting cervical cancer. In these cases, your health care provider may recommend that you have screening and Pap tests more often.  If you have a family history of uterine cancer or ovarian cancer, talk with your health care provider about genetic screening.  If you have vaginal bleeding after reaching menopause, tell your health care provider.  There are currently no reliable tests available to screen for ovarian cancer.  Lung Cancer Lung cancer screening is recommended for adults 31-59 years old who are at high risk for lung cancer because of a history of smoking. A yearly low-dose CT scan of the lungs is recommended if you:  Currently  smoke.  Have a history of at least 30 pack-years of smoking and you currently smoke or have quit within the past 15 years. A pack-year is smoking an average of one pack of cigarettes per day for one year.  Yearly screening should:  Continue until it has been 15 years since you quit.  Stop if you develop a health problem that would prevent you from having lung cancer treatment.  Colorectal Cancer  This type of cancer can be detected and can often be prevented.  Routine colorectal cancer screening usually begins at age 100 and continues through age 89.  If you have risk factors for colon cancer, your health care provider may recommend that you be screened at an earlier age.  If you have a family history of colorectal cancer, talk with your health care provider about genetic screening.  Your health care provider may also recommend using home test kits to check for hidden blood in your stool.  A small camera at the end of a tube can be used to examine your colon directly (sigmoidoscopy or colonoscopy). This is done to check for the earliest forms of colorectal cancer.  Direct examination of the colon should be repeated every 5-10 years until age 29. However, if early forms of precancerous polyps or small growths are found or if you have a family history or genetic risk for colorectal cancer, you may need to be screened more often.  Skin Cancer  Check your skin from head to toe regularly.  Monitor any moles. Be sure to tell your health care provider: ? About any new moles or changes in moles, especially if there is a change in a mole's shape or color. ? If you have a mole that is larger than the size of a pencil eraser.  If any of your family members has a history of skin cancer, especially at a young age, talk with your health care provider about genetic screening.  Always use sunscreen. Apply sunscreen liberally and repeatedly throughout the day.  Whenever you are outside, protect  yourself by wearing long sleeves, pants, a wide-brimmed hat, and sunglasses.  What should I know about osteoporosis? Osteoporosis is a condition in which bone destruction happens more quickly than new bone creation. After menopause, you may be at an increased risk for osteoporosis. To help prevent osteoporosis or the bone fractures that can happen because of osteoporosis, the following is recommended:  If you are 62-85 years old, get at least 1,000 mg of calcium and at least 600 mg of vitamin D per day.  If you are older than age 33 but younger than age 78, get at least 1,200 mg of calcium and at least 600 mg of vitamin D per day.  If you are older than age 59, get at least 1,200 mg of calcium and at least 800 mg of vitamin D per day.  Smoking and excessive alcohol intake increase the risk of osteoporosis. Eat foods that are rich in calcium and vitamin D, and do weight-bearing exercises several times each week as directed by your health care provider. What should I know about how menopause affects my mental health? Depression may occur at any age, but it is more common as you become older. Common symptoms of depression include:  Low or sad mood.  Changes in sleep patterns.  Changes in appetite or eating patterns.  Feeling an overall lack of motivation or enjoyment of activities that you previously enjoyed.  Frequent crying spells.  Talk with your health care provider if you think that you are experiencing depression. What should I know about immunizations? It is important that you get and maintain your immunizations. These include:  Tetanus, diphtheria, and pertussis (Tdap) booster vaccine.  Influenza every year before the flu season begins.  Pneumonia vaccine.  Shingles vaccine.  Your health care provider may also recommend other immunizations. This information is not intended to replace advice given to you by your health care provider. Make sure you discuss any questions you  have with your health care provider. Document Released: 08/16/2005 Document Revised: 01/12/2016 Document Reviewed: 03/28/2015 Elsevier Interactive Patient Education  2018 [place]. Pneumococcal Conjugate Vaccine (PCV13) What You Need to Know 1. Why get vaccinated? Vaccination can protect both children and adults from pneumococcal disease. Pneumococcal disease is caused by bacteria that can spread from person to person through close contact. It can cause ear infections, and it can also lead to more serious infections of the:  Lungs (pneumonia),  Blood (bacteremia), and  Covering of the brain and spinal cord (meningitis).  Pneumococcal pneumonia is most common among adults. Pneumococcal meningitis can cause deafness and brain damage, and it kills about 1 child in 10 who get it. Anyone can get pneumococcal disease, but children under 40 years of age and adults 74 years and older, people with certain medical conditions, and cigarette smokers are at the highest risk. Before there was a vaccine, the [place] saw:  more than 700 cases of meningitis,  about 13,000 blood  infections,  about 5 million ear infections, and  about 200 deaths  in children under 5 each year from pneumococcal disease. Since vaccine became available, severe pneumococcal disease in these children has fallen by 88%. About 18,000 older adults die of pneumococcal disease each year in the [place]. Treatment of pneumococcal infections with penicillin and other drugs is not as effective as it used to be, because some strains of the disease have become resistant to these drugs. This makes prevention of the disease, through vaccination, even more important. 2. PCV13 vaccine Pneumococcal conjugate vaccine (called PCV13) protects against 13 types of pneumococcal bacteria. PCV13 is routinely given to children at 2, 4, 6, and 35-29 months of age. It is also recommended for children and adults 76 to 70 years of age with  certain health conditions, and for all adults 29 years of age and older. Your doctor can give you details. 3. Some people should not get this vaccine Anyone who has ever had a life-threatening allergic reaction to a dose of this vaccine, to an earlier pneumococcal vaccine called PCV7, or to any vaccine containing diphtheria toxoid (for example, DTaP), should not get PCV13. Anyone with a severe allergy to any component of PCV13 should not get the vaccine. Tell your doctor if the person being vaccinated has any severe allergies. If the person scheduled for vaccination is not feeling well, your healthcare provider might decide to reschedule the shot on another day. 4. Risks of a vaccine reaction With any medicine, including vaccines, there is a chance of reactions. These are usually mild and go away on their own, but serious reactions are also possible. Problems reported following PCV13 varied by age and dose in the series. The most common problems reported among children were:  About half became drowsy after the shot, had a temporary loss of appetite, or had redness or tenderness where the shot was given.  About 1 out of 3 had swelling where the shot was given.  About 1 out of 3 had a mild fever, and about 1 in 20 had a fever over 102.33F.  Up to about 8 out of 10 became fussy or irritable.  Adults have reported pain, redness, and swelling where the shot was given; also mild fever, fatigue, headache, chills, or muscle pain. Young children who get PCV13 along with inactivated flu vaccine at the same time may be at increased risk for seizures caused by fever. Ask your doctor for more information. Problems that could happen after any vaccine:  People sometimes faint after a medical procedure, including vaccination. Sitting or lying down for about 15 minutes can help prevent fainting, and injuries caused by a fall. Tell your doctor if you feel dizzy, or have vision changes or ringing in the  ears.  Some older children and adults get severe pain in the shoulder and have difficulty moving the arm where a shot was given. This happens very rarely.  Any medication can cause a severe allergic reaction. Such reactions from a vaccine are very rare, estimated at about 1 in a million doses, and would happen within a few minutes to a few hours after the vaccination. As with any medicine, there is a very small chance of a vaccine causing a serious injury or death. The safety of vaccines is always being monitored. For more information, visit: http://www.aguilar.org/ 5. What if there is a serious reaction? What should I look for? Look for anything that concerns you, such as signs of a severe allergic reaction,  very high fever, or unusual behavior. Signs of a severe allergic reaction can include hives, swelling of the face and throat, difficulty breathing, a fast heartbeat, dizziness, and weakness-usually within a few minutes to a few hours after the vaccination. What should I do?  If you think it is a severe allergic reaction or other emergency that can't wait, call 9-1-1 or get the person to the nearest hospital. Otherwise, call your doctor.  Reactions should be reported to the Vaccine Adverse Event Reporting System (VAERS). Your doctor should file this report, or you can do it yourself through the VAERS web site at www.vaers.SamedayNews.es, or by calling (217)811-8717. ? VAERS does not give medical advice. 6. The National Vaccine Injury Compensation Program The [employer] Vaccine Injury Compensation Program (VICP) is a federal program that was created to compensate people who may have been injured by certain vaccines. Persons who believe they may have been injured by a vaccine can learn about the program and about filing a claim by calling 726-371-3494 or visiting the [place] website at GoldCloset.com.ee. There is a time limit to file a claim for compensation. 7. How can I learn  more?  Ask your healthcare provider. He or she can give you the vaccine package insert or suggest other sources of information.  Call your local or state health department.  Contact the Centers for Disease Control and Prevention (CDC): ? Call 218-132-5376 (1-800-CDC-INFO) or ? Visit CDC's website at http://hunter.com/ Vaccine Information Statement, PCV13 Vaccine (05/12/2014) This information is not intended to replace advice given to you by your health care provider. Make sure you discuss any questions you have with your health care provider. Document Released: 04/21/2006 Document Revised: 03/14/2016 Document Reviewed: 03/14/2016 Elsevier Interactive Patient Education  2017 Elsevier Inc. Leg Cramps Leg cramps occur when a muscle or muscles tighten and you have no control over this tightening (involuntary muscle contraction). Muscle cramps can develop in any muscle, but the most common place is in the calf muscles of the leg. Those cramps can occur during exercise or when you are at rest. Leg cramps are painful, and they may last for a few seconds to a few minutes. Cramps may return several times before they finally stop. Usually, leg cramps are not caused by a serious medical problem. In many cases, the cause is not known. Some common causes include:  Overexertion.  Overuse from repetitive motions, or doing the same thing over and over.  Remaining in a certain position for a long period of time.  Improper preparation, form, or technique while performing a sport or an activity.  Dehydration.  Injury.  Side effects of some medicines.  Abnormally low levels of the salts and ions in your blood (electrolytes), especially potassium and calcium. These levels could be low if you are taking water pills (diuretics) or if you are pregnant.  Follow these instructions at home: Watch your condition for any changes. Taking the following actions may help to lessen any discomfort that you are  feeling:  Stay well-hydrated. Drink enough fluid to keep your urine clear or pale yellow.  Try massaging, stretching, and relaxing the affected muscle. Do this for several minutes at a time.  For tight or tense muscles, use a warm towel, heating pad, or hot shower water directed to the affected area.  If you are sore or have pain after a cramp, applying ice to the affected area may relieve discomfort. ? Put ice in a plastic bag. ? Place a towel between your skin and  the bag. ? Leave the ice on for 20 minutes, 2-3 times per day.  Avoid strenuous exercise for several days if you have been having frequent leg cramps.  Make sure that your diet includes the essential minerals for your muscles to work normally.  Take medicines only as directed by your health care provider.  Contact a health care provider if:  Your leg cramps get more severe or more frequent, or they do not improve over time.  Your foot becomes cold, numb, or blue. This information is not intended to replace advice given to you by your health care provider. Make sure you discuss any questions you have with your health care provider. Document Released: 08/01/2004 Document Revised: 11/30/2015 Document Reviewed: 06/01/2014 Elsevier Interactive Patient Education  [employer].

## 2016-12-17 NOTE — Assessment & Plan Note (Signed)
Stable- only with dietary indiscretions. Call with any concerns.

## 2016-12-17 NOTE — Assessment & Plan Note (Signed)
Stable. Continue breathing exercises. Call with any concerns.

## 2016-12-18 LAB — COMPREHENSIVE METABOLIC PANEL
A/G RATIO: 1.8 (ref 1.2–2.2)
ALT: 18 IU/L (ref 0–32)
AST: 22 IU/L (ref 0–40)
Albumin: 4.6 g/dL (ref 3.6–4.8)
Alkaline Phosphatase: 54 IU/L (ref 39–117)
BUN / CREAT RATIO: 14 (ref 12–28)
BUN: 12 mg/dL (ref 8–27)
Bilirubin Total: 0.3 mg/dL (ref 0.0–1.2)
CALCIUM: 9.9 mg/dL (ref 8.7–10.3)
CO2: 23 mmol/L (ref 20–29)
Chloride: 104 mmol/L (ref 96–106)
Creatinine, Ser: 0.85 mg/dL (ref 0.57–1.00)
GFR, EST AFRICAN AMERICAN: 82 mL/min/{1.73_m2} (ref >59)
GFR, EST NON AFRICAN AMERICAN: 71 mL/min/{1.73_m2} (ref >59)
GLOBULIN, TOTAL: 2.5 g/dL (ref 1.5–4.5)
Glucose: 87 mg/dL (ref 65–99)
POTASSIUM: 4 mmol/L (ref 3.5–5.2)
SODIUM: 142 mmol/L (ref 134–144)
TOTAL PROTEIN: 7.1 g/dL (ref 6.0–8.5)

## 2016-12-18 LAB — LIPID PANEL W/O CHOL/HDL RATIO
Cholesterol, Total: 257 mg/dL — ABNORMAL HIGH (ref 100–199)
HDL: 96 mg/dL (ref >39)
LDL Calculated: 148 mg/dL — ABNORMAL HIGH (ref 0–99)
Triglycerides: 66 mg/dL (ref 0–149)
VLDL Cholesterol Cal: 13 mg/dL (ref 5–40)

## 2016-12-18 LAB — CBC WITH DIFFERENTIAL/PLATELET
Basophils Absolute: 0 10*3/uL (ref 0.0–0.2)
Basos: 1 %
EOS (ABSOLUTE): 0.1 10*3/uL (ref 0.0–0.4)
Eos: 2 %
HEMATOCRIT: 42.8 % (ref 34.0–46.6)
Hemoglobin: 14.6 g/dL (ref 11.1–15.9)
IMMATURE GRANULOCYTES: 0 %
Immature Grans (Abs): 0 10*3/uL (ref 0.0–0.1)
Lymphocytes Absolute: 1.5 10*3/uL (ref 0.7–3.1)
Lymphs: 35 %
MCH: 31.1 pg (ref 26.6–33.0)
MCHC: 34.1 g/dL (ref 31.5–35.7)
MCV: 91 fL (ref 79–97)
MONOS ABS: 0.3 10*3/uL (ref 0.1–0.9)
Monocytes: 7 %
NEUTROS PCT: 55 %
Neutrophils Absolute: 2.4 10*3/uL (ref 1.4–7.0)
Platelets: 233 10*3/uL (ref 150–379)
RBC: 4.7 x10E6/uL (ref 3.77–5.28)
RDW: 12.5 % (ref 12.3–15.4)
WBC: 4.2 10*3/uL (ref 3.4–10.8)

## 2016-12-18 LAB — VITAMIN D 25 HYDROXY (VIT D DEFICIENCY, FRACTURES): Vit D, 25-Hydroxy: 23 ng/mL — ABNORMAL LOW (ref 30.0–100.0)

## 2016-12-18 LAB — TSH: TSH: 3.1 u[IU]/mL (ref 0.450–4.500)

## 2016-12-19 ENCOUNTER — Encounter: Payer: Self-pay | Admitting: Family Medicine

## 2016-12-19 DIAGNOSIS — E559 Vitamin D deficiency, unspecified: Secondary | ICD-10-CM | POA: Insufficient documentation

## 2017-03-24 ENCOUNTER — Ambulatory Visit
Admission: RE | Admit: 2017-03-24 | Discharge: 2017-03-24 | Disposition: A | Discharge status: Home / Self Care | Payer: Medicare Other | Source: Ambulatory Visit | Attending: Family Medicine | Admitting: Family Medicine

## 2017-03-24 ENCOUNTER — Encounter: Payer: Self-pay | Admitting: Family Medicine

## 2017-03-24 ENCOUNTER — Ambulatory Visit (INDEPENDENT_AMBULATORY_CARE_PROVIDER_SITE_OTHER): Payer: Medicare Other | Admitting: Family Medicine

## 2017-03-24 ENCOUNTER — Telehealth: Payer: Self-pay | Admitting: Family Medicine

## 2017-03-24 VITALS — BP 135/82 | HR 63 | Temp 98.4°F

## 2017-03-24 DIAGNOSIS — M25561 Pain in right knee: Secondary | ICD-10-CM

## 2017-03-24 DIAGNOSIS — M11261 Other chondrocalcinosis, right knee: Secondary | ICD-10-CM | POA: Diagnosis not present

## 2017-03-24 MED ORDER — PANTOPRAZOLE SODIUM 40 MG PO TBEC: 40.0000 mg | DELAYED_RELEASE_TABLET | Freq: Every day | ORAL | 1 refills | Status: DC

## 2017-03-24 MED ORDER — PREDNISONE 10 MG PO TABS
ORAL_TABLET | ORAL | 0 refills | Status: DC
Start: 2017-03-24 — End: 2017-04-11

## 2017-03-24 MED ORDER — CYCLOBENZAPRINE HCL 10 MG PO TABS: 10.0000 mg | ORAL_TABLET | Freq: Three times a day (TID) | ORAL | 0 refills | Status: DC | PRN

## 2017-03-24 NOTE — Progress Notes (Signed)
   BP 135/82   Pulse 63   Temp 98.4 F (36.9 C)   SpO2 98%    Subjective:    Patient ID: Angie Copeland, female    DOB: 03-08-1951, 66 y.o.   MRN: [id number]  HPI: Angie Copeland is a 66 y.o. female  Chief Complaint  Patient presents with  . Knee Pain    right knee x 3 weeks, thinks she twisted it wrong. Getting worse. Taking Aleve.   Patient presents with significant right knee pain x 3 weeks. Started after she was out dancing one evening, feels like she twisted wrong at one point. ROM intact, but severe pain with weight bearing. Describes pain as radiating upward from knee laterally and at some times radiating down back of knee. Taking aleve and using ice with mild relief. Denies swelling or bruising, previous injury.   Relevant past medical, surgical, family and social history reviewed and updated as indicated. Interim medical history since our last visit reviewed. Allergies and medications reviewed and updated.  Review of Systems  Constitutional: Negative.   HENT: Negative.   Respiratory: Negative.   Cardiovascular: Negative.   Musculoskeletal: Positive for arthralgias.  Neurological: Negative.   Psychiatric/Behavioral: Negative.     Per HPI unless specifically indicated above     Objective:    BP 135/82   Pulse 63   Temp 98.4 F (36.9 C)   SpO2 98%   Wt Readings from Last 3 Encounters:  12/17/16 147 lb 11.2 oz (67 kg)  10/15/16 149 lb 1.6 oz (67.6 kg)    Physical Exam  Constitutional: She is oriented to person, place, and time. She appears well-developed and well-nourished.  HENT:  Head: Atraumatic.  Eyes: Pupils are equal, round, and reactive to light. Conjunctivae are normal.  Neck: Normal range of motion. Neck supple.  Cardiovascular: Normal rate and normal heart sounds.   Pulmonary/Chest: Effort normal and breath sounds normal. No respiratory distress.  Musculoskeletal: Normal range of motion. She exhibits tenderness (worst at right medial knee). She  exhibits no edema or deformity.  No joint laxity, negative varus and valgus testing, minimal crepitus  Neurological: She is alert and oriented to person, place, and time.  Skin: Skin is warm and dry.  Psychiatric: She has a normal mood and affect. Her behavior is normal.  Nursing note and vitals reviewed.     Assessment & Plan:   Problem List Items Addressed This Visit    None    Visit Diagnoses    Acute pain of right knee    -  Primary   Unclear etiology, will try prednisone and flexeril and get x-ray. Stretches, epsom salt soak, topical pain relievers prn. F/u if no improvement   Relevant Orders   DG Knee Complete 4 Views Right (Completed)       Follow up plan: Return if symptoms worsen or fail to improve.

## 2017-03-24 NOTE — Telephone Encounter (Signed)
Called pt and discussed knee x-ray being fairly normal with just a touch of OA changes possibly. Will continue as planned with several days on prednisone and flexeril and if no improvement, will refer to orthopedics for further evaluation

## 2017-03-26 NOTE — Patient Instructions (Signed)
Follow up as needed

## 2017-03-27 ENCOUNTER — Telehealth: Payer: Self-pay | Admitting: Family Medicine

## 2017-03-27 NOTE — Telephone Encounter (Signed)
Patient notified. She is ok with continuing the Prednisone. She wanted to let us know she did fall yesterday, but didn't not get injured.

## 2017-03-27 NOTE — Telephone Encounter (Signed)
Routing to provider  

## 2017-03-27 NOTE — Telephone Encounter (Signed)
Likely is from the prednisone, if it's tolerable she can continue the medication but if it's severe or she's having SOB, rash, or CP she should go ahead and stop. It's more of a side effect than an allergic rxn to it

## 2017-04-02 ENCOUNTER — Telehealth: Payer: Self-pay | Admitting: Family Medicine

## 2017-04-02 NOTE — Telephone Encounter (Signed)
Routing to provider  

## 2017-04-02 NOTE — Telephone Encounter (Signed)
Patient notified

## 2017-04-02 NOTE — Telephone Encounter (Addendum)
Exercises up front for her. If not better in another week, should be seen.

## 2017-04-07 ENCOUNTER — Telehealth: Payer: Self-pay | Admitting: Family Medicine

## 2017-04-07 NOTE — Telephone Encounter (Signed)
Patient notified to continue the stretches, and if not better within a couple days, call and schedule a appointment since she is worried that she may have injured it again.

## 2017-04-07 NOTE — Telephone Encounter (Signed)
Patient states she feels she has "reinjured" her leg-Saturday she spent the day in [place] and feels she may have overdid it making it very difficult for her to walk. She is hoping that Dr Wynetta Emery could recommend something or if she would need to be seen again.  Please advise  Thanks  [phone]

## 2017-04-11 ENCOUNTER — Encounter: Payer: Self-pay | Admitting: Family Medicine

## 2017-04-11 ENCOUNTER — Ambulatory Visit (INDEPENDENT_AMBULATORY_CARE_PROVIDER_SITE_OTHER): Payer: Medicare Other | Admitting: Family Medicine

## 2017-04-11 VITALS — BP 137/78 | HR 86 | Temp 98.1°F | Wt 145.0 lb

## 2017-04-11 DIAGNOSIS — Z23 Encounter for immunization: Secondary | ICD-10-CM | POA: Diagnosis not present

## 2017-04-11 DIAGNOSIS — M25561 Pain in right knee: Secondary | ICD-10-CM

## 2017-04-11 MED ORDER — PREDNISONE 10 MG PO TABS: ORAL_TABLET | ORAL | 0 refills | Status: DC

## 2017-04-11 NOTE — Progress Notes (Signed)
   BP 137/78 (BP Location: Right Arm, Patient Position: Sitting, Cuff Size: Normal)   Pulse 86   Temp 98.1 F (36.7 C)   Wt 145 lb (65.8 kg) Comment: Severe knee pain, cannot step up on scale. Estimate weight.  SpO2 99%   BMI 24.81 kg/m    Subjective:    Patient ID: Angie Copeland, female    DOB: [birth year], 66 y.o.   MRN: [id number]  HPI: DAMETRA WHETSEL is a 66 y.o. female  Chief Complaint  Patient presents with  . Knee Pain    Right. X's few weeks. Patient states severe pain. Has been doing at home exercise, some she can't do. Can't walk very well. Patient states it's impacted her quality of life.   Patient presents to f/u on severe right knee pain that has not improved over the past month with prednisone, flexeril, PT exercises, and rest. She is a very active person and is distraught at how much this issue is impacting her daily life. Wanting to see orthopedics for further evaluation. Recent x-ray showing possible arthritic changes/deposits in joint.   Relevant past medical, surgical, family and social history reviewed and updated as indicated. Interim medical history since our last visit reviewed. Allergies and medications reviewed and updated.  Review of Systems  Constitutional: Negative.   Respiratory: Negative.   Cardiovascular: Negative.   Gastrointestinal: Negative.   Musculoskeletal: Positive for arthralgias.  Neurological: Negative.   Psychiatric/Behavioral: Negative.     Per HPI unless specifically indicated above     Objective:    BP 137/78 (BP Location: Right Arm, Patient Position: Sitting, Cuff Size: Normal)   Pulse 86   Temp 98.1 F (36.7 C)   Wt 145 lb (65.8 kg) Comment: Severe knee pain, cannot step up on scale. Estimate weight.  SpO2 99%   BMI 24.81 kg/m   Wt Readings from Last 3 Encounters:  04/11/17 145 lb (65.8 kg)  12/17/16 147 lb 11.2 oz (67 kg)  10/15/16 149 lb 1.6 oz (67.6 kg)    Physical Exam  Constitutional: She is oriented to person,  place, and time. She appears well-developed and well-nourished. No distress.  HENT:  Head: Atraumatic.  Eyes: Pupils are equal, round, and reactive to light. Conjunctivae are normal. No scleral icterus.  Neck: Normal range of motion. Neck supple.  Cardiovascular: Normal rate and normal heart sounds.   Pulmonary/Chest: Effort normal and breath sounds normal. She exhibits no tenderness.  Musculoskeletal: She exhibits edema (trace, right knee) and tenderness (B/l joint lines and anterior right knee). She exhibits no deformity.  ROM exam limited by pain  Neurological: She is alert and oriented to person, place, and time.  Skin: Skin is warm and dry. No erythema.  Psychiatric: She has a normal mood and affect. Her behavior is normal.  Nursing note and vitals reviewed.     Assessment & Plan:   Problem List Items Addressed This Visit    None    Visit Diagnoses    Acute pain of right knee    -  Primary   Will start one more prednisone taper for comfort while awaiting orthopedic consult. Rest, topical pain relievers,ice/heat, tylenol prn   Relevant Orders   AMB referral to orthopedics   Need for influenza vaccination       Relevant Orders   Flu vaccine HIGH DOSE PF (Fluzone High dose) (Completed)       Follow up plan: Return for as scheduled.

## 2017-04-11 NOTE — Patient Instructions (Signed)
Start prednisone taper about 2 days after flu shot

## 2017-04-15 ENCOUNTER — Encounter (INDEPENDENT_AMBULATORY_CARE_PROVIDER_SITE_OTHER): Payer: Self-pay | Admitting: Orthopaedic Surgery

## 2017-04-15 ENCOUNTER — Ambulatory Visit (INDEPENDENT_AMBULATORY_CARE_PROVIDER_SITE_OTHER): Payer: Medicare Other | Admitting: Orthopaedic Surgery

## 2017-04-15 DIAGNOSIS — M1711 Unilateral primary osteoarthritis, right knee: Secondary | ICD-10-CM

## 2017-04-15 MED ORDER — METHYLPREDNISOLONE ACETATE 40 MG/ML IJ SUSP
40.0000 mg | INTRAMUSCULAR | Status: AC | PRN
  Administered 2017-04-15: 40 mg via INTRA_ARTICULAR

## 2017-04-15 MED ORDER — LIDOCAINE HCL 1 % IJ SOLN
2.0000 mL | INTRAMUSCULAR | Status: AC | PRN
  Administered 2017-04-15: 2 mL

## 2017-04-15 MED ORDER — BUPIVACAINE HCL 0.5 % IJ SOLN
2.0000 mL | INTRAMUSCULAR | Status: AC | PRN
  Administered 2017-04-15: 2 mL via INTRA_ARTICULAR

## 2017-04-15 MED ORDER — DICLOFENAC SODIUM 1 % TD GEL: 2.0000 g | Freq: Four times a day (QID) | TRANSDERMAL | 5 refills | Status: DC

## 2017-04-15 NOTE — Progress Notes (Signed)
Office Visit Note   Patient: Angie Copeland           Date of Birth: [birth year]           MRN: [id number] Visit Date: 04/15/2017              Requested by: Valerie Roys, DO [place], [place] [address] PCP: Valerie Roys, DO   Assessment & Plan: Visit Diagnoses:  1. Unilateral primary osteoarthritis, right knee     Plan: Overall impression is right knee osteoarthritis flareup with medial degenerative meniscal tear. Cortisone injection performed today patient tolerates well. Voltaren gel prescribed. Questions encouraged and answered. Physical therapy referral to [facility] physical therapy.  Follow-Up Instructions: Return if symptoms worsen or fail to improve.   Orders:  No orders of the defined types were placed in this encounter.  Meds ordered this encounter  Medications  . diclofenac sodium (VOLTAREN) 1 % GEL    Sig: Apply 2 g topically 4 (four) times daily.    Dispense:  1 Tube    Refill:  5      Procedures: Large Joint Inj Date/Time: 04/15/2017 11:16 AM Performed by: Leandrew Koyanagi Authorized by: Leandrew Koyanagi   Consent Given by:  Patient Timeout: prior to procedure the correct patient, procedure, and site was verified   Indications:  Pain Location:  Knee Site:  R knee Prep: patient was prepped and draped in usual sterile fashion   Needle Size:  22 G Approach:  Lateral Ultrasound Guidance: No   Fluoroscopic Guidance: No   Arthrogram: No   Medications:  2 mL bupivacaine 0.5 %; 40 mg methylPREDNISolone acetate 40 MG/ML; 2 mL lidocaine 1 % Patient tolerance:  Patient tolerated the procedure well with no immediate complications     Clinical Data: No additional findings.   Subjective: Chief Complaint  Patient presents with  . Right Knee - Pain    Patient is a 66 year old female with 6 week history of right knee pain of that. She has been limping endorses stiffness. She is very active and still dances. She has significant difficulty with ambulation  and using stairs. The pain is gotten worse. Prednisone gave her temporary relief. She endorses some tingling and burning pain. Denies any back pain.    Review of Systems  Constitutional: Negative.   HENT: Negative.   Eyes: Negative.   Respiratory: Negative.   Cardiovascular: Negative.   Endocrine: Negative.   Musculoskeletal: Negative.   Neurological: Negative.   Hematological: Negative.   Psychiatric/Behavioral: Negative.   All other systems reviewed and are negative.    Objective: Vital Signs: There were no vitals taken for this visit.  Physical Exam  Constitutional: She is oriented to person, place, and time. She appears well-developed and well-nourished.  HENT:  Head: Normocephalic and atraumatic.  Eyes: EOM are normal.  Neck: Neck supple.  Pulmonary/Chest: Effort normal.  Abdominal: Soft.  Neurological: She is alert and oriented to person, place, and time.  Skin: Skin is warm. Capillary refill takes less than 2 seconds.  Psychiatric: She has a normal mood and affect. Her behavior is normal. Judgment and thought content normal.  Nursing note and vitals reviewed.   Ortho Exam Right knee exam shows no joint effusion. Collaterals and cruciates are grossly intact. Patellar tracking is normal. Has anserine bursa is nontender. Medial joint line tenderness. Specialty Comments:  No specialty comments available.  Imaging: No results found.   PMFS History: Patient Active Problem List  Diagnosis Date Noted  . Vitamin D deficiency 12/19/2016  . Advance directive discussed with patient 12/17/2016  . Rosacea 10/15/2016  . Allergic rhinitis 10/15/2016  . GE reflux 10/15/2016  . Hyperlipidemia, unspecified 10/15/2016  . Osteoporosis 10/15/2016  . Fever blister 10/15/2016  . Anxiety 10/15/2016  . DDD (degenerative disc disease), lumbar 01/20/2012   Past Medical History:  Diagnosis Date  . Allergy   . DDD (degenerative disc disease), lumbar   . Esophageal mass 2015     Found incidently on CXR- Saw ENT and for visualization and nothing was there.   Marland Kitchen GERD (gastroesophageal reflux disease)   . Hyperlipidemia   . Osteoporosis     Family History  Problem Relation Age of Onset  . Heart disease Mother   . Diabetes Mother   . Hiatal hernia Mother   . Arthritis Mother   . Cancer Father 84       Colon  . Colitis Sister   . Irritable bowel syndrome Sister   . Arthritis Sister   . Diabetes Brother   . Stroke Maternal Grandmother   . Pneumonia Maternal Grandmother   . Kidney failure Paternal Grandmother   . Leukemia Paternal Grandfather   . Cancer Paternal Grandfather        Leukemia  . Heart disease Sister   . Irregular heart beat Sister   . Thyroid disease Sister     Past Surgical History:  Procedure Laterality Date  . NOSE SURGERY     Cartilage removed from left side of nose   Social History   Occupational History  . Not on file.   Social History Main Topics  . Smoking status: Former Smoker    Types: Cigarettes    Quit date: 07/09/1971  . Smokeless tobacco: Never Used  . Alcohol use 4.2 oz/week    7 Glasses of wine per week     Comment: 28oz of red wine a week  . Drug use: No  . Sexual activity: No

## 2017-04-21 ENCOUNTER — Telehealth (INDEPENDENT_AMBULATORY_CARE_PROVIDER_SITE_OTHER): Payer: Self-pay | Admitting: Orthopaedic Surgery

## 2017-04-21 NOTE — Telephone Encounter (Signed)
Patient called advised she can not get her Rx refilled unless [facility] is called for approval. The number to contact [facility] is [phone]. The number to contact patient is [phone]

## 2017-04-21 NOTE — Telephone Encounter (Signed)
Patient aware gave pt Rx number

## 2017-04-21 NOTE — Telephone Encounter (Signed)
Called [facility] Rx approved.

## 2017-04-21 NOTE — Telephone Encounter (Signed)
PA # [id number]

## 2017-12-19 ENCOUNTER — Ambulatory Visit (INDEPENDENT_AMBULATORY_CARE_PROVIDER_SITE_OTHER): Payer: Medicare Other

## 2017-12-19 ENCOUNTER — Ambulatory Visit (INDEPENDENT_AMBULATORY_CARE_PROVIDER_SITE_OTHER): Payer: Medicare Other | Admitting: Family Medicine

## 2017-12-19 ENCOUNTER — Encounter: Payer: Self-pay | Admitting: Family Medicine

## 2017-12-19 VITALS — BP 118/70 | HR 68 | Temp 98.2°F | Resp 16 | Ht 63.2 in | Wt 153.9 lb

## 2017-12-19 VITALS — BP 118/70 | HR 68 | Temp 98.2°F | Wt 153.9 lb

## 2017-12-19 DIAGNOSIS — Z1159 Encounter for screening for other viral diseases: Secondary | ICD-10-CM | POA: Diagnosis not present

## 2017-12-19 DIAGNOSIS — F419 Anxiety disorder, unspecified: Secondary | ICD-10-CM | POA: Diagnosis not present

## 2017-12-19 DIAGNOSIS — E782 Mixed hyperlipidemia: Secondary | ICD-10-CM

## 2017-12-19 DIAGNOSIS — Z0000 Encounter for general adult medical examination without abnormal findings: Secondary | ICD-10-CM | POA: Diagnosis not present

## 2017-12-19 DIAGNOSIS — E559 Vitamin D deficiency, unspecified: Secondary | ICD-10-CM | POA: Diagnosis not present

## 2017-12-19 DIAGNOSIS — Z0001 Encounter for general adult medical examination with abnormal findings: Secondary | ICD-10-CM

## 2017-12-19 DIAGNOSIS — K219 Gastro-esophageal reflux disease without esophagitis: Secondary | ICD-10-CM | POA: Diagnosis not present

## 2017-12-19 LAB — UA/M W/RFLX CULTURE, ROUTINE
BILIRUBIN UA: NEGATIVE
Glucose, UA: NEGATIVE
Ketones, UA: NEGATIVE
LEUKOCYTES UA: NEGATIVE
Nitrite, UA: NEGATIVE
PH UA: 5 (ref 5.0–7.5)
PROTEIN UA: NEGATIVE
RBC, UA: NEGATIVE
Specific Gravity, UA: 1.015 (ref 1.005–1.030)
Urobilinogen, Ur: 0.2 mg/dL (ref 0.2–1.0)

## 2017-12-19 NOTE — Assessment & Plan Note (Signed)
Under good control off medicine. Continue to monitor. Call with any concerns. Checking labs.

## 2017-12-19 NOTE — Patient Instructions (Addendum)
[name] , Thank you for taking time to come for your Medicare Wellness Visit. I appreciate your ongoing commitment to your health goals. Please review the following plan we discussed and let me know if I can assist you in the future.   Screening recommendations/referrals: Colonoscopy: due 07/04/2018 Mammogram: due now, declined  Bone Density: completed 03/31/2008 Recommended yearly ophthalmology/optometry visit for glaucoma screening and checkup Recommended yearly dental visit for hygiene and checkup  Vaccinations: Influenza vaccine: up to date Pneumococcal vaccine: pneumovax 23 due, postponed Tdap vaccine: up to date Shingles vaccine: shingrix eligible, check with your insurance company for coverage    Advanced directives: Please bring a copy of your health care power of attorney and living will to the office at your convenience.  Conditions/risks identified: Decrease soda intake and increase water intake to 6-8 glasses of water a day   Next appointment: Follow up in one year for your annual wellness exam.    Preventive Care 65 Years and Older, Female Preventive care refers to lifestyle choices and visits with your health care provider that can promote health and wellness. What does preventive care include?  A yearly physical exam. This is also called an annual well check.  Dental exams once or twice a year.  Routine eye exams. Ask your health care provider how often you should have your eyes checked.  Personal lifestyle choices, including:  Daily care of your teeth and gums.  Regular physical activity.  Eating a healthy diet.  Avoiding tobacco and drug use.  Limiting alcohol use.  Practicing safe sex.  Taking low-dose aspirin every day.  Taking vitamin and mineral supplements as recommended by your health care provider. What happens during an annual well check? The services and screenings done by your health care provider during your annual well check will depend  on your age, overall health, lifestyle risk factors, and family history of disease. Counseling  Your health care provider may ask you questions about your:  Alcohol use.  Tobacco use.  Drug use.  Emotional well-being.  Home and relationship well-being.  Sexual activity.  Eating habits.  History of falls.  Memory and ability to understand (cognition).  Work and work Statistician.  Reproductive health. Screening  You may have the following tests or measurements:  Height, weight, and BMI.  Blood pressure.  Lipid and cholesterol levels. These may be checked every 5 years, or more frequently if you are over 68 years old.  Skin check.  Lung cancer screening. You may have this screening every year starting at age 32 if you have a 30-pack-year history of smoking and currently smoke or have quit within the past 15 years.  Fecal occult blood test (FOBT) of the stool. You may have this test every year starting at age 68.  Flexible sigmoidoscopy or colonoscopy. You may have a sigmoidoscopy every 5 years or a colonoscopy every 10 years starting at age 64.  Hepatitis C blood test.  Hepatitis B blood test.  Sexually transmitted disease (STD) testing.  Diabetes screening. This is done by checking your blood sugar (glucose) after you have not eaten for a while (fasting). You may have this done every 1-3 years.  Bone density scan. This is done to screen for osteoporosis. You may have this done starting at age 30.  Mammogram. This may be done every 1-2 years. Talk to your health care provider about how often you should have regular mammograms. Talk with your health care provider about your test results, treatment options,  and if necessary, the need for more tests. Vaccines  Your health care provider may recommend certain vaccines, such as:  Influenza vaccine. This is recommended every year.  Tetanus, diphtheria, and acellular pertussis (Tdap, Td) vaccine. You may need a Td  booster every 10 years.  Zoster vaccine. You may need this after age 66.  Pneumococcal 13-valent conjugate (PCV13) vaccine. One dose is recommended after age 55.  Pneumococcal polysaccharide (PPSV23) vaccine. One dose is recommended after age 13. Talk to your health care provider about which screenings and vaccines you need and how often you need them. This information is not intended to replace advice given to you by your health care provider. Make sure you discuss any questions you have with your health care provider. Document Released: 07/21/2015 Document Revised: 03/13/2016 Document Reviewed: 04/25/2015 Elsevier Interactive Patient Education  2017 [place] Prevention in the Home Falls can cause injuries. They can happen to people of all ages. There are many things you can do to make your home safe and to help prevent falls. What can I do on the outside of my home?  Regularly fix the edges of walkways and driveways and fix any cracks.  Remove anything that might make you trip as you walk through a door, such as a raised step or threshold.  Trim any bushes or trees on the path to your home.  Use bright outdoor lighting.  Clear any walking paths of anything that might make someone trip, such as rocks or tools.  Regularly check to see if handrails are loose or broken. Make sure that both sides of any steps have handrails.  Any raised decks and porches should have guardrails on the edges.  Have any leaves, snow, or ice cleared regularly.  Use sand or salt on walking paths during winter.  Clean up any spills in your garage right away. This includes oil or grease spills. What can I do in the bathroom?  Use night lights.  Install grab bars by the toilet and in the tub and shower. Do not use towel bars as grab bars.  Use non-skid mats or decals in the tub or shower.  If you need to sit down in the shower, use a plastic, non-slip stool.  Keep the floor dry. Clean up  any water that spills on the floor as soon as it happens.  Remove soap buildup in the tub or shower regularly.  Attach bath mats securely with double-sided non-slip rug tape.  Do not have throw rugs and other things on the floor that can make you trip. What can I do in the bedroom?  Use night lights.  Make sure that you have a light by your bed that is easy to reach.  Do not use any sheets or blankets that are too big for your bed. They should not hang down onto the floor.  Have a firm chair that has side arms. You can use this for support while you get dressed.  Do not have throw rugs and other things on the floor that can make you trip. What can I do in the kitchen?  Clean up any spills right away.  Avoid walking on wet floors.  Keep items that you use a lot in easy-to-reach places.  If you need to reach something above you, use a strong step stool that has a grab bar.  Keep electrical cords out of the way.  Do not use floor polish or wax that makes floors slippery. If  you must use wax, use non-skid floor wax.  Do not have throw rugs and other things on the floor that can make you trip. What can I do with my stairs?  Do not leave any items on the stairs.  Make sure that there are handrails on both sides of the stairs and use them. Fix handrails that are broken or loose. Make sure that handrails are as long as the stairways.  Check any carpeting to make sure that it is firmly attached to the stairs. Fix any carpet that is loose or worn.  Avoid having throw rugs at the top or bottom of the stairs. If you do have throw rugs, attach them to the floor with carpet tape.  Make sure that you have a light switch at the top of the stairs and the bottom of the stairs. If you do not have them, ask someone to add them for you. What else can I do to help prevent falls?  Wear shoes that:  Do not have high heels.  Have rubber bottoms.  Are comfortable and fit you well.  Are  closed at the toe. Do not wear sandals.  If you use a stepladder:  Make sure that it is fully opened. Do not climb a closed stepladder.  Make sure that both sides of the stepladder are locked into place.  Ask someone to hold it for you, if possible.  Clearly mark and make sure that you can see:  Any grab bars or handrails.  First and last steps.  Where the edge of each step is.  Use tools that help you move around (mobility aids) if they are needed. These include:  Canes.  Walkers.  Scooters.  Crutches.  Turn on the lights when you go into a dark area. Replace any light bulbs as soon as they burn out.  Set up your furniture so you have a clear path. Avoid moving your furniture around.  If any of your floors are uneven, fix them.  If there are any pets around you, be aware of where they are.  Review your medicines with your doctor. Some medicines can make you feel dizzy. This can increase your chance of falling. Ask your doctor what other things that you can do to help prevent falls. This information is not intended to replace advice given to you by your health care provider. Make sure you discuss any questions you have with your health care provider. Document Released: 04/20/2009 Document Revised: 11/30/2015 Document Reviewed: 07/29/2014 Elsevier Interactive Patient Education  2017 [employer].

## 2017-12-19 NOTE — Progress Notes (Signed)
Subjective:   Angie Copeland is a 67 y.o. female who presents for Medicare Annual (Subsequent) preventive examination.  Review of Systems:    Cardiac Risk Factors include: advanced age (>23men, >89 women);dyslipidemia;smoking/ tobacco exposure     Objective:     Vitals: BP 118/70 (BP Location: Left Arm, Patient Position: Sitting)   Pulse 68   Temp 98.2 F (36.8 C) (Temporal)   Resp 16   Ht 5' 3.2" (1.605 m)   Wt 153 lb 14.4 oz (69.8 kg)   SpO2 98%   BMI 27.09 kg/m   Body mass index is 27.09 kg/m.  Advanced Directives 12/19/2017 12/17/2016  Does Patient Have a Medical Advance Directive? Yes Yes  Type of Advance Directive Living will;Healthcare Power of [place];Living will  Does patient want to make changes to medical advance directive? - No - Patient declined  Copy of [place] in Chart? No - copy requested No - copy requested    Tobacco Social History   Tobacco Use  Smoking Status Former Smoker  . Types: Cigarettes  . Last attempt to quit: 07/09/1971  . 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 Allergy   . DDD (degenerative disc disease), lumbar   . Esophageal mass 2015   Found incidently on CXR- Saw ENT and for visualization and nothing was there.   Marland Kitchen GERD (gastroesophageal reflux disease)   . Hyperlipidemia   . Osteoporosis    Past Surgical  History:  Procedure Laterality Date  . EYE SURGERY     cataract surgery   . NOSE SURGERY     Cartilage removed from left side of nose   Family History  Problem Relation Age of Onset  . Heart disease Mother   . Diabetes Mother   . Hiatal hernia Mother   . Arthritis Mother   . Cancer Father 62       Colon  . Colitis Sister   . Irritable bowel syndrome Sister   . Arthritis Sister   . Diabetes Brother   . Stroke Maternal Grandmother   . Pneumonia Maternal Grandmother   . Kidney failure Paternal Grandmother   . Leukemia Paternal Grandfather   . Cancer Paternal Grandfather        Leukemia  . Heart disease Sister   . Irregular heart beat Sister   . Thyroid disease Sister    Social History   Socioeconomic History  . Marital status: Married    Spouse name: Not on file  . Number of children: Not on file  . Years of education: Not on file  . Highest education level: Not on file  Occupational History  . Not on file  Social Needs  . Financial resource strain: Not hard at all  . Food insecurity:    Worry: Never true    Inability: Never true  . Transportation needs:  Medical: No    Non-medical: No  Tobacco Use  . Smoking status: Former Smoker    Types: Cigarettes    Last attempt to quit: 07/09/1971    Years since quitting: 46.4  . Smokeless tobacco: Never Used  Substance and Sexual Activity  . Alcohol use: Yes    Alcohol/week: 3.6 oz    Types: 6 Glasses of wine per week    Comment: 28oz of red wine a week  . Drug use: No  . Sexual activity: Never  Lifestyle  . Physical activity:    Days per week: 1 day    Minutes per session: 120 min  . Stress: Not at all  Relationships  . Social connections:    Talks on phone: More than three times a week    Gets together: More than three times a week    Attends religious service: More than 4 times per year    Active member of club or organization: Yes    Attends meetings of clubs or organizations: More than 4 times per year      Relationship status: Divorced  Other Topics Concern  . Not on file  Social History Narrative  . Not on file    Outpatient Encounter Medications as of 12/19/2017  Medication Sig  . Carboxymethylcellulose Sodium (LUBRICANT EYE DROPS OP) Apply to eye.  . diclofenac sodium (VOLTAREN) 1 % GEL Apply 2 g topically 4 (four) times daily.  . diphenhydramine-acetaminophen (TYLENOL PM) 25-500 MG TABS tablet Take 1 tablet by mouth at bedtime as needed.  . predniSONE (DELTASONE) 10 MG tablet Take 6 tabs day one, then 5 tabs day two, 4 tabs day three, etc (Patient not taking: Reported on 12/19/2017)   No facility-administered encounter medications on file as of 12/19/2017.     Activities of Daily Living In your present state of health, do you have any difficulty performing the following activities: 12/19/2017  Hearing? N  Vision? N  Difficulty concentrating or making decisions? N  Walking or climbing stairs? N  Dressing or bathing? N  Doing errands, shopping? N  Preparing Food and eating ? N  Using the Toilet? N  In the past six months, have you accidently leaked urine? Y  Comment only when lifting something heavy   Do you have problems with loss of bowel control? N  Managing your Medications? N  Managing your Finances? N  Housekeeping or managing your Housekeeping? N  Some recent data might be hidden    Patient Care Team: Valerie Roys, DO as PCP - General (Family Medicine)    Assessment:   This is a routine wellness examination for Angie Copeland.  Exercise Activities and Dietary recommendations Current Exercise Habits: Structured exercise class, Time (Minutes): > 60, Frequency (Times/Week): 1, Weekly Exercise (Minutes/Week): 0, Intensity: Mild, Exercise limited by: None identified  Goals    . DIET - DECREASE SODA OR JUICE INTAKE     Decrease soda intake and increase water intake to 6-8 glasses of water a day        Fall Risk Fall Risk  12/19/2017 12/17/2016 10/15/2016  Falls in the  past year? No Yes No  Number falls in past yr: - 1 -  Injury with Fall? - No -   Is the patient's home free of loose throw rugs in walkways, pet beds, electrical cords, etc?   yes      Grab bars in the bathroom? yes      Handrails on the stairs?   yes  Adequate lighting?   no  Timed Get Up and Go performed: Completed in 8 seconds with no use of assistive devices, steady gait. No intervention needed at this time.   Depression Screen PHQ 2/9 Scores 12/19/2017 12/17/2016 10/15/2016  PHQ - 2 Score 0 0 0  PHQ- 9 Score 4 - -     Cognitive Function     6CIT Screen 12/19/2017 12/17/2016  What Year? 0 points 0 points  What month? 0 points 0 points  What time? 0 points 0 points  Count back from 20 0 points 2 points  Months in reverse 0 points 0 points  Repeat phrase 0 points 0 points  Total Score 0 2    Immunization History  Administered Date(s) Administered  . Influenza, High Dose Seasonal PF 04/11/2017  . Influenza-Unspecified 04/07/2016  . Pneumococcal Conjugate-13 12/17/2016  . Tdap 01/20/2012    Qualifies for Shingles Vaccine? Yes, discussed shingrix vaccine   Screening Tests Health Maintenance  Topic Date Due  . Hepatitis C Screening  [birth year]  . PNA vac Low Risk Adult (2 of 2 - PPSV23) 12/17/2017  . MAMMOGRAM  12/20/2018 (Originally 02/03/2014)  . INFLUENZA VACCINE  02/05/2018  . COLONOSCOPY  07/04/2018  . TETANUS/TDAP  01/19/2022  . DEXA SCAN  Completed    Cancer Screenings: Lung: Low Dose CT Chest recommended if Age 13-80 years, 30 pack-year currently smoking OR have quit w/in 15years. Patient does not qualify. Breast:  Up to date on Mammogram? No  Due now - declined  Up to date of Bone Density/Dexa? Yes 03/31/2008 Colorectal: completed 06/2008  Additional Screenings:  Hepatitis C Screening: done today      Plan:    I have personally reviewed and addressed the Medicare Annual Wellness questionnaire and have noted the following in the patient's chart:   A. Medical and social history B. Use of alcohol, tobacco or illicit drugs  C. Current medications and supplements D. Functional ability and status E.  Nutritional status F.  Physical activity G. Advance directives H. List of other physicians I.  Hospitalizations, surgeries, and ER visits in previous 12 months J.  [place] such as hearing and vision if needed, cognitive and depression L. Referrals and appointments   In addition, I have reviewed and discussed with patient certain preventive protocols, quality metrics, and best practice recommendations. A written personalized care plan for preventive services as well as general preventive health recommendations were provided to patient.   Signed,  Tyler Aas, LPN Nurse Health Advisor   Nurse Notes:none

## 2017-12-19 NOTE — Progress Notes (Signed)
BP 118/70   Pulse 68   Temp 98.2 F (36.8 C)   Wt 153 lb 14.4 oz (69.8 kg)   SpO2 98%   BMI 27.09 kg/m    Subjective:    Patient ID: Angie Copeland, female    DOB: 1950/11/24, 67 y.o.   MRN: [id number]  HPI: Angie Copeland is a 67 y.o. female presenting on 12/19/2017 for comprehensive medical examination. Current medical complaints include:  HYPERLIPIDEMIA Hyperlipidemia status: Stable Satisfied with current treatment?  yes Side effects:  no Medication compliance: Not on anytihng Aspirin:  no The 10-year ASCVD risk score Angie Bussing DC Jr., et al., 2013) is: 6.2%   Values used to calculate the score:     Age: 3 years     Sex: Female     Is Non-Hispanic African American: No     Diabetic: No     Tobacco smoker: No     Systolic Blood Pressure: 825 mmHg     Is BP treated: No     HDL Cholesterol: 96 mg/dL     Total Cholesterol: 257 mg/dL Chest pain:  no Coronary artery disease:  no  ANXIETY/STRESS Duration:controlled Anxious mood: no  Excessive worrying: no Irritability: no  Sweating: no Nausea: no Palpitations:no Hyperventilation: no Panic attacks: no Agoraphobia: no  Obscessions/compulsions: no Depressed mood: no Depression screen [facility] 2/9 12/19/2017 12/17/2016 10/15/2016  Decreased Interest 0 0 0  Down, Depressed, Hopeless 0 0 0  PHQ - 2 Score 0 0 0  Altered sleeping 2 - -  Tired, decreased energy 0 - -  Change in appetite 2 - -  Feeling bad or failure about yourself  0 - -  Trouble concentrating 0 - -  Moving slowly or fidgety/restless 0 - -  Suicidal thoughts 0 - -  PHQ-9 Score 4 - -  Difficult doing work/chores Not difficult at all - -   Anhedonia: no Weight changes: no Insomnia: no   Hypersomnia: no Fatigue/loss of energy: no Feelings of worthlessness: no Feelings of guilt: no Impaired concentration/indecisiveness: no Suicidal ideations: no  Crying spells: no Recent Stressors/Life Changes: no   Relationship problems: no   Family stress: no    Financial stress: no    Job stress: no    Recent death/loss: no  Menopausal Symptoms: no  Depression Screen done today and results listed below:  Depression screen [facility] 2/9 12/19/2017 12/17/2016 10/15/2016  Decreased Interest 0 0 0  Down, Depressed, Hopeless 0 0 0  PHQ - 2 Score 0 0 0  Altered sleeping 2 - -  Tired, decreased energy 0 - -  Change in appetite 2 - -  Feeling bad or failure about yourself  0 - -  Trouble concentrating 0 - -  Moving slowly or fidgety/restless 0 - -  Suicidal thoughts 0 - -  PHQ-9 Score 4 - -  Difficult doing work/chores Not difficult at all - -    Past Medical History:  Past Medical History:  Diagnosis Date  . Allergy   . DDD (degenerative disc disease), lumbar   . Esophageal mass 2015   Found incidently on CXR- Saw ENT and for visualization and nothing was there.   Marland Kitchen GERD (gastroesophageal reflux disease)   . Hyperlipidemia   . Osteoporosis     Surgical History:  Past Surgical History:  Procedure Laterality Date  . EYE SURGERY     cataract surgery   . NOSE SURGERY     Cartilage removed from left side of nose  Medications:  Current Outpatient Medications on File Prior to Visit  Medication Sig  . Carboxymethylcellulose Sodium (LUBRICANT EYE DROPS OP) Apply to eye.  . diclofenac sodium (VOLTAREN) 1 % GEL Apply 2 g topically 4 (four) times daily.  . diphenhydramine-acetaminophen (TYLENOL PM) 25-500 MG TABS tablet Take 1 tablet by mouth at bedtime as needed.   No current facility-administered medications on file prior to visit.     Allergies:  Allergies  Allergen Reactions  . Penicillins Rash    Social History:  Social History   Socioeconomic History  . Marital status: Married    Spouse name: Not on file  . Number of children: Not on file  . Years of education: Not on file  . Highest education level: Not on file  Occupational History  . Not on file  Social Needs  . Financial resource strain: Not hard at all  . Food  insecurity:    Worry: Never true    Inability: Never true  . Transportation needs:    Medical: No    Non-medical: No  Tobacco Use  . Smoking status: Former Smoker    Types: Cigarettes    Last attempt to quit: 07/09/1971    Years since quitting: 46.4  . Smokeless tobacco: Never Used  Substance and Sexual Activity  . Alcohol use: Yes    Alcohol/week: 3.6 oz    Types: 6 Glasses of wine per week    Comment: 28oz of red wine a week  . Drug use: No  . Sexual activity: Never  Lifestyle  . Physical activity:    Days per week: 1 day    Minutes per session: 120 min  . Stress: Not at all  Relationships  . Social connections:    Talks on phone: More than three times a week    Gets together: More than three times a week    Attends religious service: More than 4 times per year    Active member of club or organization: Yes    Attends meetings of clubs or organizations: More than 4 times per year    Relationship status: Divorced  . Intimate partner violence:    Fear of current or ex partner: No    Emotionally abused: No    Physically abused: No    Forced sexual activity: No  Other Topics Concern  . Not on file  Social History Narrative  . Not on file   Social History   Tobacco Use  Smoking Status Former Smoker  . Types: Cigarettes  . Last attempt to quit: 07/09/1971  . Years since quitting: 46.4  Smokeless Tobacco Never Used   Social History   Substance and Sexual Activity  Alcohol Use Yes  . Alcohol/week: 3.6 oz  . Types: 6 Glasses of wine per week   Comment: 28oz of red wine a week    Family History:  Family History  Problem Relation Age of Onset  . Heart disease Mother   . Diabetes Mother   . Hiatal hernia Mother   . Arthritis Mother   . Cancer Father 27       Colon  . Colitis Sister   . Irritable bowel syndrome Sister   . Arthritis Sister   . Diabetes Brother   . Stroke Maternal Grandmother   . Pneumonia Maternal Grandmother   . Kidney failure Paternal  Grandmother   . Leukemia Paternal Grandfather   . Cancer Paternal Grandfather        Leukemia  . Heart  disease Sister   . Irregular heart beat Sister   . Thyroid disease Sister     Past medical history, surgical history, medications, allergies, family history and social history reviewed with patient today and changes made to appropriate areas of the chart.   Review of Systems  Constitutional: Negative.   HENT: Negative.   Eyes: Negative.   Respiratory: Negative.   Cardiovascular: Negative.   Gastrointestinal: Negative for abdominal pain, blood in stool, constipation, diarrhea, heartburn, melena, nausea and vomiting.       Very sensitive to acidic food  Genitourinary: Negative.   Musculoskeletal: Negative.        Leg cramps and hand joint   Skin: Negative.   Neurological: Negative.   Endo/Heme/Allergies: Positive for environmental allergies. Negative for polydipsia. Does not bruise/bleed easily.  Psychiatric/Behavioral: Negative.     All other ROS negative except what is listed above and in the HPI.      Objective:    BP 118/70   Pulse 68   Temp 98.2 F (36.8 C)   Wt 153 lb 14.4 oz (69.8 kg)   SpO2 98%   BMI 27.09 kg/m   Wt Readings from Last 3 Encounters:  12/19/17 153 lb 14.4 oz (69.8 kg)  12/19/17 153 lb 14.4 oz (69.8 kg)  04/11/17 145 lb (65.8 kg)    Physical Exam  Constitutional: She is oriented to person, place, and time. She appears well-developed and well-nourished. No distress.  HENT:  Head: Normocephalic and atraumatic.  Right Ear: Hearing and external ear normal.  Left Ear: Hearing and external ear normal.  Nose: Nose normal.  Mouth/Throat: Oropharynx is clear and moist. No oropharyngeal exudate.  Eyes: Pupils are equal, round, and reactive to light. Conjunctivae, EOM and lids are normal. Right eye exhibits no discharge. Left eye exhibits no discharge. No scleral icterus.  Neck: Normal range of motion. Neck supple. No tracheal deviation present. No  thyromegaly present.  Cardiovascular: Normal rate, regular rhythm, normal heart sounds and intact distal pulses. Exam reveals no gallop and no friction rub.  No murmur heard. Pulmonary/Chest: Effort normal and breath sounds normal. No stridor. No respiratory distress. She has no wheezes. She has no rales. She exhibits no tenderness.  Abdominal: Soft. Bowel sounds are normal. She exhibits no distension and no mass. There is no tenderness. There is no rebound and no guarding. No hernia.  Genitourinary:  Genitourinary Comments: Breast and pelvic exams deferred with shared decision making   Musculoskeletal: Normal range of motion. She exhibits no edema, tenderness or deformity.  Lymphadenopathy:    She has no cervical adenopathy.  Neurological: She is alert and oriented to person, place, and time. She displays normal reflexes. No cranial nerve deficit or sensory deficit. She exhibits normal muscle tone. Coordination normal.  Skin: Skin is warm, dry and intact. Capillary refill takes less than 2 seconds. No rash noted. She is not diaphoretic. No erythema. No pallor.  Psychiatric: She has a normal mood and affect. Her speech is normal and behavior is normal. Judgment and thought content normal. Cognition and memory are normal.  Nursing note and vitals reviewed.   Results for orders placed or performed in visit on 12/19/17  UA/M w/rflx Culture, Routine  Result Value Ref Range   Specific Gravity, UA 1.015 1.005 - 1.030   pH, UA 5.0 5.0 - 7.5   Color, UA Yellow Yellow   Appearance Ur Clear Clear   Leukocytes, UA Negative Negative   Protein, UA Negative Negative/Trace   Glucose,  UA Negative Negative   Ketones, UA Negative Negative   RBC, UA Negative Negative   Bilirubin, UA Negative Negative   Urobilinogen, Ur 0.2 0.2 - 1.0 mg/dL   Nitrite, UA Negative Negative      Assessment & Plan:   Problem List Items Addressed This Visit      Digestive   GE reflux    Under good control off  medicine. Continue to monitor. Call with any concerns. Checking labs.       Relevant Orders   CBC with Differential/Platelet   TSH   UA/M w/rflx Culture, Routine (Completed)     Other   Hyperlipidemia, unspecified    Under good control off medicine. Continue to monitor. Call with any concerns. Checking labs.       Relevant Orders   Comprehensive metabolic panel   Lipid Panel w/o Chol/HDL Ratio   TSH   UA/M w/rflx Culture, Routine (Completed)   Anxiety    Under good control off medicine. Continue to monitor. Call with any concerns. Checking labs.       Relevant Orders   TSH   UA/M w/rflx Culture, Routine (Completed)   Vitamin D deficiency    Rechecking labs today. Await results. Call with any concerns.       Relevant Orders   TSH   UA/M w/rflx Culture, Routine (Completed)   VITAMIN D 25 Hydroxy (Vit-D Deficiency, Fractures)    Other Visit Diagnoses    Routine general medical examination at a health care facility    -  Primary   Vaccines up to date. Screening labs checked today. Mammogram declined. Colonoscopy and DEXA up to date. Continue diet and exercise. Call with any concerns.        Follow up plan: Return in about 1 year (around 12/20/2018) for Wellness and Physical.   LABORATORY TESTING:  - Pap smear: not applicable  IMMUNIZATIONS:   - Tdap: Tetanus vaccination status reviewed: last tetanus booster within 10 years. - Influenza: Postponed to flu season - Pneumovax: Will get in the fall - Prevnar: up to date  SCREENING: -Mammogram: Refused  - Colonoscopy: Up to date  - Bone Density: Up to date   PATIENT COUNSELING:   Advised to take 1 mg of folate supplement per day if capable of pregnancy.   Sexuality: Discussed sexually transmitted diseases, partner selection, use of condoms, avoidance of unintended pregnancy  and contraceptive alternatives.   Advised to avoid cigarette smoking.  I discussed with the patient that most people either abstain from  alcohol or drink within safe limits (<=14/week and <=4 drinks/occasion for males, <=7/weeks and <= 3 drinks/occasion for females) and that the risk for alcohol disorders and other health effects rises proportionally with the number of drinks per week and how often a drinker exceeds daily limits.  Discussed cessation/primary prevention of drug use and availability of treatment for abuse.   Diet: Encouraged to adjust caloric intake to maintain  or achieve ideal body weight, to reduce intake of dietary saturated fat and total fat, to limit sodium intake by avoiding high sodium foods and not adding table salt, and to maintain adequate dietary potassium and calcium preferably from fresh fruits, vegetables, and low-fat dairy products.    stressed the importance of regular exercise  Injury prevention: Discussed safety belts, safety helmets, smoke detector, smoking near bedding or upholstery.   Dental health: Discussed importance of regular tooth brushing, flossing, and dental visits.    NEXT PREVENTATIVE PHYSICAL DUE IN 1 YEAR. Return in  about 1 year (around 12/20/2018) for Wellness and Physical.

## 2017-12-19 NOTE — Patient Instructions (Signed)
Health Maintenance for Postmenopausal Women Menopause is a normal process in which your reproductive ability comes to an end. This process happens gradually over a span of months to years, usually between the ages of 22 and 9. Menopause is complete when you have missed 12 consecutive menstrual periods. It is important to talk with your health care provider about some of the most common conditions that affect postmenopausal women, such as heart disease, cancer, and bone loss (osteoporosis). Adopting a healthy lifestyle and getting preventive care can help to promote your health and wellness. Those actions can also lower your chances of developing some of these common conditions. What should I know about menopause? During menopause, you may experience a number of symptoms, such as:  Moderate-to-severe hot flashes.  Night sweats.  Decrease in sex drive.  Mood swings.  Headaches.  Tiredness.  Irritability.  Memory problems.  Insomnia.  Choosing to treat or not to treat menopausal changes is an individual decision that you make with your health care provider. What should I know about hormone replacement therapy and supplements? Hormone therapy products are effective for treating symptoms that are associated with menopause, such as hot flashes and night sweats. Hormone replacement carries certain risks, especially as you become older. If you are thinking about using estrogen or estrogen with progestin treatments, discuss the benefits and risks with your health care provider. What should I know about heart disease and stroke? Heart disease, heart attack, and stroke become more likely as you age. This may be due, in part, to the hormonal changes that your body experiences during menopause. These can affect how your body processes dietary fats, triglycerides, and cholesterol. Heart attack and stroke are both medical emergencies. There are many things that you can do to help prevent heart disease  and stroke:  Have your blood pressure checked at least every 1-2 years. High blood pressure causes heart disease and increases the risk of stroke.  If you are 53-22 years old, ask your health care provider if you should take aspirin to prevent a heart attack or a stroke.  Do not use any tobacco products, including cigarettes, chewing tobacco, or electronic cigarettes. If you need help quitting, ask your health care provider.  It is important to eat a healthy diet and maintain a healthy weight. ? Be sure to include plenty of vegetables, fruits, low-fat dairy products, and lean protein. ? Avoid eating foods that are high in solid fats, added sugars, or salt (sodium).  Get regular exercise. This is one of the most important things that you can do for your health. ? Try to exercise for at least 150 minutes each week. The type of exercise that you do should increase your heart rate and make you sweat. This is known as moderate-intensity exercise. ? Try to do strengthening exercises at least twice each week. Do these in addition to the moderate-intensity exercise.  Know your numbers.Ask your health care provider to check your cholesterol and your blood glucose. Continue to have your blood tested as directed by your health care provider.  What should I know about cancer screening? There are several types of cancer. Take the following steps to reduce your risk and to catch any cancer development as early as possible. Breast Cancer  Practice breast self-awareness. ? This means understanding how your breasts normally appear and feel. ? It also means doing regular breast self-exams. Let your health care provider know about any changes, no matter how small.  If you are 40  or older, have a clinician do a breast exam (clinical breast exam or CBE) every year. Depending on your age, family history, and medical history, it may be recommended that you also have a yearly breast X-ray (mammogram).  If you  have a family history of breast cancer, talk with your health care provider about genetic screening.  If you are at high risk for breast cancer, talk with your health care provider about having an MRI and a mammogram every year.  Breast cancer (BRCA) gene test is recommended for women who have family members with BRCA-related cancers. Results of the assessment will determine the need for genetic counseling and BRCA1 and for BRCA2 testing. BRCA-related cancers include these types: ? Breast. This occurs in males or females. ? Ovarian. ? Tubal. This may also be called fallopian tube cancer. ? Cancer of the abdominal or pelvic lining (peritoneal cancer). ? Prostate. ? Pancreatic.  Cervical, Uterine, and Ovarian Cancer Your health care provider may recommend that you be screened regularly for cancer of the pelvic organs. These include your ovaries, uterus, and vagina. This screening involves a pelvic exam, which includes checking for microscopic changes to the surface of your cervix (Pap test).  For women ages 21-65, health care providers may recommend a pelvic exam and a Pap test every three years. For women ages 79-65, they may recommend the Pap test and pelvic exam, combined with testing for human papilloma virus (HPV), every five years. Some types of HPV increase your risk of cervical cancer. Testing for HPV may also be done on women of any age who have unclear Pap test results.  Other health care providers may not recommend any screening for nonpregnant women who are considered low risk for pelvic cancer and have no symptoms. Ask your health care provider if a screening pelvic exam is right for you.  If you have had past treatment for cervical cancer or a condition that could lead to cancer, you need Pap tests and screening for cancer for at least 20 years after your treatment. If Pap tests have been discontinued for you, your risk factors (such as having a new sexual partner) need to be  reassessed to determine if you should start having screenings again. Some women have medical problems that increase the chance of getting cervical cancer. In these cases, your health care provider may recommend that you have screening and Pap tests more often.  If you have a family history of uterine cancer or ovarian cancer, talk with your health care provider about genetic screening.  If you have vaginal bleeding after reaching menopause, tell your health care provider.  There are currently no reliable tests available to screen for ovarian cancer.  Lung Cancer Lung cancer screening is recommended for adults 69-62 years old who are at high risk for lung cancer because of a history of smoking. A yearly low-dose CT scan of the lungs is recommended if you:  Currently smoke.  Have a history of at least 30 pack-years of smoking and you currently smoke or have quit within the past 15 years. A pack-year is smoking an average of one pack of cigarettes per day for one year.  Yearly screening should:  Continue until it has been 15 years since you quit.  Stop if you develop a health problem that would prevent you from having lung cancer treatment.  Colorectal Cancer  This type of cancer can be detected and can often be prevented.  Routine colorectal cancer screening usually begins at  age 42 and continues through age 45.  If you have risk factors for colon cancer, your health care provider may recommend that you be screened at an earlier age.  If you have a family history of colorectal cancer, talk with your health care provider about genetic screening.  Your health care provider may also recommend using home test kits to check for hidden blood in your stool.  A small camera at the end of a tube can be used to examine your colon directly (sigmoidoscopy or colonoscopy). This is done to check for the earliest forms of colorectal cancer.  Direct examination of the colon should be repeated every  5-10 years until age 71. However, if early forms of precancerous polyps or small growths are found or if you have a family history or genetic risk for colorectal cancer, you may need to be screened more often.  Skin Cancer  Check your skin from head to toe regularly.  Monitor any moles. Be sure to tell your health care provider: ? About any new moles or changes in moles, especially if there is a change in a mole's shape or color. ? If you have a mole that is larger than the size of a pencil eraser.  If any of your family members has a history of skin cancer, especially at a young age, talk with your health care provider about genetic screening.  Always use sunscreen. Apply sunscreen liberally and repeatedly throughout the day.  Whenever you are outside, protect yourself by wearing long sleeves, pants, a wide-brimmed hat, and sunglasses.  What should I know about osteoporosis? Osteoporosis is a condition in which bone destruction happens more quickly than new bone creation. After menopause, you may be at an increased risk for osteoporosis. To help prevent osteoporosis or the bone fractures that can happen because of osteoporosis, the following is recommended:  If you are 46-71 years old, get at least 1,000 mg of calcium and at least 600 mg of vitamin D per day.  If you are older than age 55 but younger than age 65, get at least 1,200 mg of calcium and at least 600 mg of vitamin D per day.  If you are older than age 54, get at least 1,200 mg of calcium and at least 800 mg of vitamin D per day.  Smoking and excessive alcohol intake increase the risk of osteoporosis. Eat foods that are rich in calcium and vitamin D, and do weight-bearing exercises several times each week as directed by your health care provider. What should I know about how menopause affects my mental health? Depression may occur at any age, but it is more common as you become older. Common symptoms of depression  include:  Low or sad mood.  Changes in sleep patterns.  Changes in appetite or eating patterns.  Feeling an overall lack of motivation or enjoyment of activities that you previously enjoyed.  Frequent crying spells.  Talk with your health care provider if you think that you are experiencing depression. What should I know about immunizations? It is important that you get and maintain your immunizations. These include:  Tetanus, diphtheria, and pertussis (Tdap) booster vaccine.  Influenza every year before the flu season begins.  Pneumonia vaccine.  Shingles vaccine.  Your health care provider may also recommend other immunizations. This information is not intended to replace advice given to you by your health care provider. Make sure you discuss any questions you have with your health care provider. Document Released: 08/16/2005  Document Revised: 01/12/2016 Document Reviewed: 03/28/2015 Elsevier Interactive Patient Education  2018 [employer]  

## 2017-12-19 NOTE — Assessment & Plan Note (Signed)
Rechecking labs today. Await results. Call with any concerns.  

## 2017-12-20 LAB — COMPREHENSIVE METABOLIC PANEL
A/G RATIO: 1.9 (ref 1.2–2.2)
ALBUMIN: 4.4 g/dL (ref 3.6–4.8)
ALT: 13 IU/L (ref 0–32)
AST: 18 IU/L (ref 0–40)
Alkaline Phosphatase: 51 IU/L (ref 39–117)
BILIRUBIN TOTAL: 0.3 mg/dL (ref 0.0–1.2)
BUN / CREAT RATIO: 16 (ref 12–28)
BUN: 11 mg/dL (ref 8–27)
CHLORIDE: 103 mmol/L (ref 96–106)
CO2: 20 mmol/L (ref 20–29)
Calcium: 9.3 mg/dL (ref 8.7–10.3)
Creatinine, Ser: 0.69 mg/dL (ref 0.57–1.00)
GFR calc non Af Amer: 90 mL/min/{1.73_m2} (ref >59)
GFR, EST AFRICAN AMERICAN: 103 mL/min/{1.73_m2} (ref >59)
Globulin, Total: 2.3 g/dL (ref 1.5–4.5)
Glucose: 80 mg/dL (ref 65–99)
Potassium: 4 mmol/L (ref 3.5–5.2)
Sodium: 141 mmol/L (ref 134–144)
Total Protein: 6.7 g/dL (ref 6.0–8.5)

## 2017-12-20 LAB — CBC WITH DIFFERENTIAL/PLATELET
BASOS: 1 %
Basophils Absolute: 0 10*3/uL (ref 0.0–0.2)
EOS (ABSOLUTE): 0 10*3/uL (ref 0.0–0.4)
EOS: 1 %
HEMOGLOBIN: 14.8 g/dL (ref 11.1–15.9)
Hematocrit: 43.5 % (ref 34.0–46.6)
Immature Grans (Abs): 0 10*3/uL (ref 0.0–0.1)
Immature Granulocytes: 0 %
LYMPHS: 36 %
Lymphocytes Absolute: 1.8 10*3/uL (ref 0.7–3.1)
MCH: 32 pg (ref 26.6–33.0)
MCHC: 34 g/dL (ref 31.5–35.7)
MCV: 94 fL (ref 79–97)
MONOCYTES: 6 %
Monocytes Absolute: 0.3 10*3/uL (ref 0.1–0.9)
NEUTROS ABS: 2.8 10*3/uL (ref 1.4–7.0)
Neutrophils: 56 %
Platelets: 226 10*3/uL (ref 150–450)
RBC: 4.62 x10E6/uL (ref 3.77–5.28)
RDW: 12.8 % (ref 12.3–15.4)
WBC: 5 10*3/uL (ref 3.4–10.8)

## 2017-12-20 LAB — TSH: TSH: 2.87 u[IU]/mL (ref 0.450–4.500)

## 2017-12-20 LAB — LIPID PANEL W/O CHOL/HDL RATIO
Cholesterol, Total: 242 mg/dL — ABNORMAL HIGH (ref 100–199)
HDL: 75 mg/dL (ref >39)
LDL Calculated: 147 mg/dL — ABNORMAL HIGH (ref 0–99)
Triglycerides: 100 mg/dL (ref 0–149)
VLDL CHOLESTEROL CAL: 20 mg/dL (ref 5–40)

## 2017-12-20 LAB — VITAMIN D 25 HYDROXY (VIT D DEFICIENCY, FRACTURES): VIT D 25 HYDROXY: 19.8 ng/mL — AB (ref 30.0–100.0)

## 2017-12-22 ENCOUNTER — Encounter: Payer: Self-pay | Admitting: Family Medicine

## 2018-02-10 ENCOUNTER — Ambulatory Visit: Payer: Medicare Other | Admitting: Physician Assistant

## 2018-02-10 ENCOUNTER — Ambulatory Visit: Payer: Self-pay

## 2018-02-10 ENCOUNTER — Encounter: Payer: Self-pay | Admitting: Physician Assistant

## 2018-02-10 ENCOUNTER — Other Ambulatory Visit: Payer: Self-pay

## 2018-02-10 VITALS — BP 137/76 | HR 75 | Temp 98.0°F | Ht 63.0 in | Wt 157.0 lb

## 2018-02-10 DIAGNOSIS — S70361A Insect bite (nonvenomous), right thigh, initial encounter: Secondary | ICD-10-CM | POA: Diagnosis not present

## 2018-02-10 DIAGNOSIS — W57XXXA Bitten or stung by nonvenomous insect and other nonvenomous arthropods, initial encounter: Secondary | ICD-10-CM

## 2018-02-10 MED ORDER — PREDNISONE 10 MG (21) PO TBPK: ORAL_TABLET | ORAL | 0 refills | Status: DC

## 2018-02-10 NOTE — Telephone Encounter (Signed)
Pt. Reports she was bitten/stung in her backyard yesterday. Right leg above the knee in the middle of her thigh. The area has a 4 inch area of redness around the bite site. No pain, "more of a burning sensation." "I've never had a reaction like this."  Applied topical OTC.Appointment made for today.  Reason for Disposition . [1] Red or very tender (to touch) area AND [2] started over 24 hours after the bite  Answer Assessment - Initial Assessment Questions 1. TYPE of INSECT: "What type of insect was it?"      Unsure 2. ONSET: "When did you get bitten?"      Yesterday 3. LOCATION: "Where is the insect bite located?"      Right leg at the front of her thigh 4. REDNESS: "Is the area red or pink?" If so, ask "What size is area of redness?" (inches or cm). "When did the redness start?"     Red - 4 inch diameter 5. PAIN: "Is there any pain?" If so, ask: "How bad is it?"  (Scale 1-10; or mild, moderate, severe)     1-2 6. ITCHING: "Does it itch?" If so, ask: "How bad is the itch?"    - MILD: doesn't interfere with normal activities   - MODERATE - SEVERE: interferes with work, school, sleep, or other activities      Burning 7. SWELLING: "How big is the swelling?" (inches, cm, or compare to coins)     4 INCHES 8. OTHER SYMPTOMS: "Do you have any other symptoms?"  (e.g., difficulty breathing, hives)     No 9. PREGNANCY: "Is there any chance you are pregnant?" "When was your last menstrual period?"     n/a  Protocols used: INSECT BITE-A-AH

## 2018-02-10 NOTE — Telephone Encounter (Signed)
Noted, thank you

## 2018-02-10 NOTE — Progress Notes (Signed)
Subjective:    Patient ID: Angie Copeland, female    DOB: 02/20/1951, 67 y.o.   MRN: [id number]  Angie Copeland is a 67 y.o. female presenting on 02/10/2018 for Leg Swelling (pt states she thinks she got biten but not sure of what exactly, right thigh redish and itching since yesterday)   HPI   Felt bite since last night, did not see what bit her. Has large red area around bite that is red and hot. There is a red hot lesion on the outside of her leg. Has put some salve on it to help. She denies any foot and calf pain. Denies any edema of lower extremity.   Social History   Tobacco Use  . Smoking status: Former Smoker    Types: Cigarettes    Last attempt to quit: 07/09/1971    Years since quitting: 46.6  . Smokeless tobacco: Never Used  Substance Use Topics  . Alcohol use: Yes    Alcohol/week: 3.6 oz    Types: 6 Glasses of wine per week    Comment: 28oz of red wine a week  . Drug use: No    Review of Systems Per HPI unless specifically indicated above     Objective:    BP 137/76   Pulse 75   Temp 98 F (36.7 C) (Oral)   Ht 5\' 3"  (1.6 m)   Wt 157 lb (71.2 kg)   SpO2 97%   BMI 27.81 kg/m   Wt Readings from Last 3 Encounters:  02/10/18 157 lb (71.2 kg)  12/19/17 153 lb 14.4 oz (69.8 kg)  12/19/17 153 lb 14.4 oz (69.8 kg)    Physical Exam  Constitutional: She is oriented to person, place, and time. She appears well-developed and well-nourished.  Cardiovascular: Normal rate.  Pulmonary/Chest: Effort normal.  Neurological: She is alert and oriented to person, place, and time.  Skin: Skin is warm and dry. There is erythema.     Psychiatric: She has a normal mood and affect. Her behavior is normal.   Results for orders placed or performed in visit on 12/19/17  CBC with Differential/Platelet  Result Value Ref Range   WBC 5.0 3.4 - 10.8 x10E3/uL   RBC 4.62 3.77 - 5.28 x10E6/uL   Hemoglobin 14.8 11.1 - 15.9 g/dL   Hematocrit 43.5 34.0 - 46.6 %   MCV 94 79 - 97 fL   MCH  32.0 26.6 - 33.0 pg   MCHC 34.0 31.5 - 35.7 g/dL   RDW 12.8 12.3 - 15.4 %   Platelets 226 150 - 450 x10E3/uL   Neutrophils 56 Not Estab. %   Lymphs 36 Not Estab. %   Monocytes 6 Not Estab. %   Eos 1 Not Estab. %   Basos 1 Not Estab. %   Neutrophils Absolute 2.8 1.4 - 7.0 x10E3/uL   Lymphocytes Absolute 1.8 0.7 - 3.1 x10E3/uL   Monocytes Absolute 0.3 0.1 - 0.9 x10E3/uL   EOS (ABSOLUTE) 0.0 0.0 - 0.4 x10E3/uL   Basophils Absolute 0.0 0.0 - 0.2 x10E3/uL   Immature Granulocytes 0 Not Estab. %   Immature Grans (Abs) 0.0 0.0 - 0.1 x10E3/uL  Comprehensive metabolic panel  Result Value Ref Range   Glucose 80 65 - 99 mg/dL   BUN 11 8 - 27 mg/dL   Creatinine, Ser 0.69 0.57 - 1.00 mg/dL   GFR calc non Af Amer 90 >59 mL/min/1.73   GFR calc Af Amer 103 >59 mL/min/1.73   BUN/Creatinine Ratio 16  12 - 28   Sodium 141 134 - 144 mmol/L   Potassium 4.0 3.5 - 5.2 mmol/L   Chloride 103 96 - 106 mmol/L   CO2 20 20 - 29 mmol/L   Calcium 9.3 8.7 - 10.3 mg/dL   Total Protein 6.7 6.0 - 8.5 g/dL   Albumin 4.4 3.6 - 4.8 g/dL   Globulin, Total 2.3 1.5 - 4.5 g/dL   Albumin/Globulin Ratio 1.9 1.2 - 2.2   Bilirubin Total 0.3 0.0 - 1.2 mg/dL   Alkaline Phosphatase 51 39 - 117 IU/L   AST 18 0 - 40 IU/L   ALT 13 0 - 32 IU/L  Lipid Panel w/o Chol/HDL Ratio  Result Value Ref Range   Cholesterol, Total 242 (H) 100 - 199 mg/dL   Triglycerides 100 0 - 149 mg/dL   HDL 75 >39 mg/dL   VLDL Cholesterol Cal 20 5 - 40 mg/dL   LDL Calculated 147 (H) 0 - 99 mg/dL  TSH  Result Value Ref Range   TSH 2.870 0.450 - 4.500 uIU/mL  UA/M w/rflx Culture, Routine  Result Value Ref Range   Specific Gravity, UA 1.015 1.005 - 1.030   pH, UA 5.0 5.0 - 7.5   Color, UA Yellow Yellow   Appearance Ur Clear Clear   Leukocytes, UA Negative Negative   Protein, UA Negative Negative/Trace   Glucose, UA Negative Negative   Ketones, UA Negative Negative   RBC, UA Negative Negative   Bilirubin, UA Negative Negative   Urobilinogen,  Ur 0.2 0.2 - 1.0 mg/dL   Nitrite, UA Negative Negative  VITAMIN D 25 Hydroxy (Vit-D Deficiency, Fractures)  Result Value Ref Range   Vit D, 25-Hydroxy 19.8 (L) 30.0 - 100.0 ng/mL      Assessment & Plan:   1. Insect bite of right thigh, initial encounter  Insect bite with large localized reaction. She did not notice any attached insect, and certainly not a tick attached longer than 24 hours. Will treat with 6 day prednisone taper of 10 mg pills, patient has had this before and tolerate well. She may use her voltaren gel on her knee as she normally does.    Follow up plan: Return if symptoms worsen or fail to improve.  Carles Collet, PA-C [facility] 02/10/2018, 9:59 AM

## 2018-02-10 NOTE — Patient Instructions (Signed)
Insect Bite, Adult An insect bite can make your skin red, itchy, and swollen. Some insects can spread disease to people with a bite. However, most insect bites do not lead to disease, and most are not serious. Follow these instructions at home: Bite area care  Do not scratch the bite area.  Keep the bite area clean and dry.  Wash the bite area every day with soap and water as told by your doctor.  Check the bite area every day for signs of infection. Check for: ? More redness, swelling, or pain. ? Fluid or blood. ? Warmth. ? Pus. Managing pain, itching, and swelling  You may put any of these on the bite area as told by your doctor: ? A baking soda paste. ? Cortisone cream. ? Calamine lotion.  If directed, put ice on the bite area. ? Put ice in a plastic bag. ? Place a towel between your skin and the bag. ? Leave the ice on for 20 minutes, 2-3 times a day. Medicines  Take medicines or put medicines on your skin only as told by your doctor.  If you were prescribed an antibiotic medicine, use it as told by your doctor. Do not stop using the antibiotic even if your condition improves. General instructions  Keep all follow-up visits as told by your doctor. This is important. How is this prevented? To help you have a lower risk of insect bites:  When you are outside, wear clothing that covers your arms and legs.  Use insect repellent. The best insect repellents have: ? An active ingredient of DEET, picaridin, oil of lemon eucalyptus (OLE), or IR3535. ? Higher amounts of DEET or another active ingredient than other repellents have.  If your home windows do not have screens, think about putting some in.  Contact a doctor if:  You have more redness, swelling, or pain in the bite area.  You have fluid, blood, or pus coming from the bite area.  The bite area feels warm.  You have a fever. Get help right away if:  You have joint pain.  You have a rash.  You have  shortness of breath.  You feel more tired or sleepy than you normally do.  You have neck pain.  You have a headache.  You feel weaker than you normally do.  You have chest pain.  You have pain in your belly.  You feel sick to your stomach (nauseous) or you throw up (vomit). Summary  An insect bite can make your skin red, itchy, and swollen.  Do not scratch the bite area, and keep it clean and dry.  Ice can help with pain and itching from the bite. This information is not intended to replace advice given to you by your health care provider. Make sure you discuss any questions you have with your health care provider. Document Released: 06/21/2000 Document Revised: 01/25/2016 Document Reviewed: 11/09/2014 Elsevier Interactive Patient Education  2018 [employer]  

## 2018-12-23 ENCOUNTER — Ambulatory Visit (INDEPENDENT_AMBULATORY_CARE_PROVIDER_SITE_OTHER): Payer: Medicare Other

## 2018-12-23 VITALS — Ht 64.0 in | Wt 148.0 lb

## 2018-12-23 DIAGNOSIS — Z0000 Encounter for general adult medical examination without abnormal findings: Secondary | ICD-10-CM | POA: Diagnosis not present

## 2018-12-23 NOTE — Patient Instructions (Signed)
Angie Copeland , Thank you for taking time to come for your Medicare Wellness Visit. I appreciate your ongoing commitment to your health goals. Please review the following plan we discussed and let me know if I can assist you in the future.   Screening recommendations/referrals: Colonoscopy: declined  Mammogram: declined  Bone Density: up to date Recommended yearly ophthalmology/optometry visit for glaucoma screening and checkup Recommended yearly dental visit for hygiene and checkup  Vaccinations: Influenza vaccine: up to date  Pneumococcal vaccine: pneumovax 23 due, will get at next in office visit  Tdap vaccine: up to date Shingles vaccine: declined  Advanced directives: please pick up a copy of this information next time your in the office.   Conditions/risks identified: none   Next appointment: Follow up in one year for your annual wellness exam.    Preventive Care 70 Years and Older, Female Preventive care refers to lifestyle choices and visits with your health care provider that can promote health and wellness. What does preventive care include?  A yearly physical exam. This is also called an annual well check.  Dental exams once or twice a year.  Routine eye exams. Ask your health care provider how often you should have your eyes checked.  Personal lifestyle choices, including:  Daily care of your teeth and gums.  Regular physical activity.  Eating a healthy diet.  Avoiding tobacco and drug use.  Limiting alcohol use.  Practicing safe sex.  Taking low-dose aspirin every day.  Taking vitamin and mineral supplements as recommended by your health care provider. What happens during an annual well check? The services and screenings done by your health care provider during your annual well check will depend on your age, overall health, lifestyle risk factors, and family history of disease. Counseling  Your health care provider may ask you questions about your:   Alcohol use.  Tobacco use.  Drug use.  Emotional well-being.  Home and relationship well-being.  Sexual activity.  Eating habits.  History of falls.  Memory and ability to understand (cognition).  Work and work Statistician.  Reproductive health. Screening  You may have the following tests or measurements:  Height, weight, and BMI.  Blood pressure.  Lipid and cholesterol levels. These may be checked every 5 years, or more frequently if you are over 49 years old.  Skin check.  Lung cancer screening. You may have this screening every year starting at age 70 if you have a 30-pack-year history of smoking and currently smoke or have quit within the past 15 years.  Fecal occult blood test (FOBT) of the stool. You may have this test every year starting at age 70.  Flexible sigmoidoscopy or colonoscopy. You may have a sigmoidoscopy every 5 years or a colonoscopy every 10 years starting at age 70.  Hepatitis C blood test.  Hepatitis B blood test.  Sexually transmitted disease (STD) testing.  Diabetes screening. This is done by checking your blood sugar (glucose) after you have not eaten for a while (fasting). You may have this done every 1-3 years.  Bone density scan. This is done to screen for osteoporosis. You may have this done starting at age 70.  Mammogram. This may be done every 1-2 years. Talk to your health care provider about how often you should have regular mammograms. Talk with your health care provider about your test results, treatment options, and if necessary, the need for more tests. Vaccines  Your health care provider may recommend certain vaccines, such as:  Influenza vaccine. This is recommended every year.  Tetanus, diphtheria, and acellular pertussis (Tdap, Td) vaccine. You may need a Td booster every 10 years.  Zoster vaccine. You may need this after age 70.  Pneumococcal 13-valent conjugate (PCV13) vaccine. One dose is recommended after age 70.   Pneumococcal polysaccharide (PPSV23) vaccine. One dose is recommended after age 70. Talk to your health care provider about which screenings and vaccines you need and how often you need them. This information is not intended to replace advice given to you by your health care provider. Make sure you discuss any questions you have with your health care provider. Document Released: 07/21/2015 Document Revised: 03/13/2016 Document Reviewed: 04/25/2015 Elsevier Interactive Patient Education  2017 [place] Prevention in the Home Falls can cause injuries. They can happen to people of all ages. There are many things you can do to make your home safe and to help prevent falls. What can I do on the outside of my home?  Regularly fix the edges of walkways and driveways and fix any cracks.  Remove anything that might make you trip as you walk through a door, such as a raised step or threshold.  Trim any bushes or trees on the path to your home.  Use bright outdoor lighting.  Clear any walking paths of anything that might make someone trip, such as rocks or tools.  Regularly check to see if handrails are loose or broken. Make sure that both sides of any steps have handrails.  Any raised decks and porches should have guardrails on the edges.  Have any leaves, snow, or ice cleared regularly.  Use sand or salt on walking paths during winter.  Clean up any spills in your garage right away. This includes oil or grease spills. What can I do in the bathroom?  Use night lights.  Install grab bars by the toilet and in the tub and shower. Do not use towel bars as grab bars.  Use non-skid mats or decals in the tub or shower.  If you need to sit down in the shower, use a plastic, non-slip stool.  Keep the floor dry. Clean up any water that spills on the floor as soon as it happens.  Remove soap buildup in the tub or shower regularly.  Attach bath mats securely with double-sided  non-slip rug tape.  Do not have throw rugs and other things on the floor that can make you trip. What can I do in the bedroom?  Use night lights.  Make sure that you have a light by your bed that is easy to reach.  Do not use any sheets or blankets that are too big for your bed. They should not hang down onto the floor.  Have a firm chair that has side arms. You can use this for support while you get dressed.  Do not have throw rugs and other things on the floor that can make you trip. What can I do in the kitchen?  Clean up any spills right away.  Avoid walking on wet floors.  Keep items that you use a lot in easy-to-reach places.  If you need to reach something above you, use a strong step stool that has a grab bar.  Keep electrical cords out of the way.  Do not use floor polish or wax that makes floors slippery. If you must use wax, use non-skid floor wax.  Do not have throw rugs and other things on the floor that  can make you trip. What can I do with my stairs?  Do not leave any items on the stairs.  Make sure that there are handrails on both sides of the stairs and use them. Fix handrails that are broken or loose. Make sure that handrails are as long as the stairways.  Check any carpeting to make sure that it is firmly attached to the stairs. Fix any carpet that is loose or worn.  Avoid having throw rugs at the top or bottom of the stairs. If you do have throw rugs, attach them to the floor with carpet tape.  Make sure that you have a light switch at the top of the stairs and the bottom of the stairs. If you do not have them, ask someone to add them for you. What else can I do to help prevent falls?  Wear shoes that:  Do not have high heels.  Have rubber bottoms.  Are comfortable and fit you well.  Are closed at the toe. Do not wear sandals.  If you use a stepladder:  Make sure that it is fully opened. Do not climb a closed stepladder.  Make sure that both  sides of the stepladder are locked into place.  Ask someone to hold it for you, if possible.  Clearly mark and make sure that you can see:  Any grab bars or handrails.  First and last steps.  Where the edge of each step is.  Use tools that help you move around (mobility aids) if they are needed. These include:  Canes.  Walkers.  Scooters.  Crutches.  Turn on the lights when you go into a dark area. Replace any light bulbs as soon as they burn out.  Set up your furniture so you have a clear path. Avoid moving your furniture around.  If any of your floors are uneven, fix them.  If there are any pets around you, be aware of where they are.  Review your medicines with your doctor. Some medicines can make you feel dizzy. This can increase your chance of falling. Ask your doctor what other things that you can do to help prevent falls. This information is not intended to replace advice given to you by your health care provider. Make sure you discuss any questions you have with your health care provider. Document Released: 04/20/2009 Document Revised: 11/30/2015 Document Reviewed: 07/29/2014 Elsevier Interactive Patient Education  2017 [employer].

## 2018-12-23 NOTE — Progress Notes (Signed)
Subjective:   Angie Copeland is a 68 y.o. female who presents for Medicare Annual (Subsequent) preventive examination.  This visit is being conducted via phone call  - after an attmept to do on video chat - due to the COVID-19 pandemic. This patient has given me verbal consent via phone to conduct this visit, patient states they are participating from their home address. Some vital signs may be absent or patient reported.   Patient identification: identified by name, DOB, and current address.    Review of Systems:   Cardiac Risk Factors include: advanced age (>33men, >21 women)     Objective:     Vitals: Ht 5\' 4"  (1.626 m) Comment: patient performed  Wt 148 lb (67.1 kg) Comment: patient performed  BMI 25.40 kg/m   Body mass index is 25.4 kg/m.  Advanced Directives 12/23/2018 12/19/2017 12/17/2016  Does Patient Have a Medical Advance Directive? No Yes Yes  Type of Advance Directive - Living will;Healthcare Power of [place];Living will  Does patient want to make changes to medical advance directive? - - No - Patient declined  Copy of [place] in Chart? - No - copy requested No - copy requested    Tobacco Social History   Tobacco Use  Smoking Status Former Smoker  . Types: Cigarettes  . Quit date: 07/09/1971  . Years since quitting: 47.4  Smokeless Tobacco Never Used     Counseling given: Not Answered   Clinical Intake:  Pre-visit preparation completed: Yes  Pain : No/denies pain     Nutritional Risks: None Diabetes: No  How often do you need to have someone help you when you read instructions, pamphlets, or other written materials from your doctor or pharmacy?: 1 - Never What is the last grade level you completed in school?: bachelors  Interpreter Needed?: No  Information entered by ::  ,LPN  Past Medical History:  Diagnosis Date  . Allergy   . DDD (degenerative disc disease), lumbar   . Esophageal  mass 2015   Found incidently on CXR- Saw ENT and for visualization and nothing was there.   Marland Kitchen GERD (gastroesophageal reflux disease)   . Hyperlipidemia   . Osteoporosis    Past Surgical History:  Procedure Laterality Date  . EYE SURGERY     cataract surgery   . NOSE SURGERY     Cartilage removed from left side of nose   Family History  Problem Relation Age of Onset  . Heart disease Mother   . Diabetes Mother   . Hiatal hernia Mother   . Arthritis Mother   . Cancer Father 75       Colon  . Colitis Sister   . Irritable bowel syndrome Sister   . Arthritis Sister   . Diabetes Brother   . Stroke Maternal Grandmother   . Pneumonia Maternal Grandmother   . Kidney failure Paternal Grandmother   . Leukemia Paternal Grandfather   . Cancer Paternal Grandfather        Leukemia  . Heart disease Sister   . Irregular heart beat Sister   . Thyroid disease Sister    Social History   Socioeconomic History  . Marital status: Divorced    Spouse name: Not on file  . Number of children: Not on file  . Years of education: Not on file  . Highest education level: Bachelor's degree (e.g., BA, AB, BS)  Occupational History  . Not on file  Social Needs  .  Financial resource strain: Not hard at all  . Food insecurity    Worry: Never true    Inability: Never true  . Transportation needs    Medical: No    Non-medical: No  Tobacco Use  . Smoking status: Former Smoker    Types: Cigarettes    Quit date: 07/09/1971    Years since quitting: 47.4  . Smokeless tobacco: Never Used  Substance and Sexual Activity  . Alcohol use: Not Currently  . Drug use: No  . Sexual activity: Never  Lifestyle  . Physical activity    Days per week: 1 day    Minutes per session: 120 min  . Stress: Not at all  Relationships  . Social connections    Talks on phone: More than three times a week    Gets together: More than three times a week    Attends religious service: More than 4 times per year    Active  member of club or organization: Yes    Attends meetings of clubs or organizations: More than 4 times per year    Relationship status: Divorced  Other Topics Concern  . Not on file  Social History Narrative  . Not on file    Outpatient Encounter Medications as of 12/23/2018  Medication Sig  . diclofenac sodium (VOLTAREN) 1 % GEL Apply 2 g topically 4 (four) times daily. (Patient not taking: Reported on 12/23/2018)  . diphenhydramine-acetaminophen (TYLENOL PM) 25-500 MG TABS tablet Take 1 tablet by mouth at bedtime as needed.  . [DISCONTINUED] neomycin-polymyxin b-dexamethasone (MAXITROL) 3.5-10000-0.1 SUSP SHAKE LQ AND INT 1 GTT IN OD TID FOR 14 DAYS  . [DISCONTINUED] predniSONE (STERAPRED UNI-PAK 21 TAB) 10 MG (21) TBPK tablet Take 6 pills on day 1, 5 pills on day 2, and then so on until complete. (Patient not taking: Reported on 12/23/2018)   No facility-administered encounter medications on file as of 12/23/2018.     Activities of Daily Living In your present state of health, do you have any difficulty performing the following activities: 12/23/2018  Hearing? N  Vision? Y  Difficulty concentrating or making decisions? N  Walking or climbing stairs? N  Dressing or bathing? N  Doing errands, shopping? N  Preparing Food and eating ? N  Using the Toilet? N  In the past six months, have you accidently leaked urine? N  Do you have problems with loss of bowel control? N  Managing your Medications? N  Managing your Finances? N  Housekeeping or managing your Housekeeping? N  Some recent data might be hidden    Patient Care Team: Valerie Roys, DO as PCP - General (Family Medicine)    Assessment:   This is a routine wellness examination for Angie Copeland.  Exercise Activities and Dietary recommendations Current Exercise Habits: The patient does not participate in regular exercise at present, Exercise limited by: None identified  Goals    . DIET - DECREASE SODA OR JUICE INTAKE      Decrease soda intake and increase water intake to 6-8 glasses of water a day        Fall Risk: Fall Risk  12/23/2018 12/19/2017 12/17/2016 10/15/2016  Falls in the past year? 0 No Yes No  Number falls in past yr: - - 1 -  Injury with Fall? - - No -    FALL RISK PREVENTION PERTAINING TO THE HOME:  Any stairs in or around the home? Yes  If so, are there any without handrails? No  Home free of loose throw rugs in walkways, pet beds, electrical cords, etc? Yes  Adequate lighting in your home to reduce risk of falls? Yes   ASSISTIVE DEVICES UTILIZED TO PREVENT FALLS:  Life alert? No  Use of a cane, walker or w/c? No  Grab bars in the bathroom? Yes  Shower chair or bench in shower? No  Elevated toilet seat or a handicapped toilet? No   DME ORDERS:  DME order needed?  No   TIMED UP AND GO:  Unable to perform   Depression Screen PHQ 2/9 Scores 12/23/2018 12/19/2017 12/17/2016 10/15/2016  PHQ - 2 Score 1 0 0 0  PHQ- 9 Score - 4 - -     Cognitive Function     6CIT Screen 12/19/2017 12/17/2016  What Year? 0 points 0 points  What month? 0 points 0 points  What time? 0 points 0 points  Count back from 20 0 points 2 points  Months in reverse 0 points 0 points  Repeat phrase 0 points 0 points  Total Score 0 2    Immunization History  Administered Date(s) Administered  . Influenza, High Dose Seasonal PF 04/11/2017  . Influenza-Unspecified 04/07/2016  . Pneumococcal Conjugate-13 12/17/2016  . Tdap 01/20/2012    Qualifies for Shingles Vaccine? Yes  Zostavax completed n/a. Due for Shingrix. Education has been provided regarding the importance of this vaccine. Pt has been advised to call insurance company to determine out of pocket expense. Advised may also receive vaccine at local pharmacy or Health Dept. Verbalized acceptance and understanding.  Tdap: up to date   Flu Vaccine: up to date  Pneumococcal Vaccine: pneumovax 23 due at next in office visit.   Screening Tests  Health Maintenance  Topic Date Due  . Hepatitis C Screening  May 14, 1951  . MAMMOGRAM  02/03/2014  . PNA vac Low Risk Adult (2 of 2 - PPSV23) 12/17/2017  . COLONOSCOPY  07/04/2018  . INFLUENZA VACCINE  02/06/2019  . TETANUS/TDAP  01/19/2022  . DEXA SCAN  Completed    Cancer Screenings:  Colorectal Screening: Completed 07/04/2008 Repeat every 10 years; patient declined due to coronavirus   Mammogram: declined due to coronavirus   Bone Density: Completed 03/31/2008.   Lung Cancer Screening: (Low Dose CT Chest recommended if Age 31-80 years, 30 pack-year currently smoking OR have quit w/in 15years.) does not qualify.    Additional Screening:  Hepatitis C Screening: does qualify; will order for next labs   Vision Screening: Recommended annual ophthalmology exams for early detection of glaucoma and other disorders of the eye. Is the patient up to date with their annual eye exam?  Yes  Who is the provider or what is the name of the office in which the pt attends annual eye exams? Dr.Bell   Dental Screening: Recommended annual dental exams for proper oral hygiene  Community Resource Referral:  CRR required this visit?  No       Plan:  I have personally reviewed and addressed the Medicare Annual Wellness questionnaire and have noted the following in the patient's chart:  A. Medical and social history B. Use of alcohol, tobacco or illicit drugs  C. Current medications and supplements D. Functional ability and status E.  Nutritional status F.  Physical activity G. Advance directives H. List of other physicians I.  Hospitalizations, surgeries, and ER visits in previous 12 months J.  [place] such as hearing and vision if needed, cognitive and depression L. Referrals and appointments  In addition, I have reviewed and discussed with patient certain preventive protocols, quality metrics, and best practice recommendations. A written personalized care plan for  preventive services as well as general preventive health recommendations were provided to patient. Nurse Health Advisor  Signed,    [place], Caro Posey, [place]  1/66/0600 Nurse Health Advisor   Nurse Notes: requests refill on voltaren gel. cpe on 02/08/2019

## 2018-12-25 ENCOUNTER — Encounter: Payer: Medicare Other | Admitting: Family Medicine

## 2018-12-25 ENCOUNTER — Ambulatory Visit: Payer: Medicare Other

## 2019-02-08 ENCOUNTER — Encounter: Payer: Medicare Other | Admitting: Family Medicine

## 2019-03-09 ENCOUNTER — Other Ambulatory Visit: Payer: Self-pay

## 2019-03-09 ENCOUNTER — Ambulatory Visit (INDEPENDENT_AMBULATORY_CARE_PROVIDER_SITE_OTHER): Payer: Medicare Other | Admitting: Podiatry

## 2019-03-09 ENCOUNTER — Encounter: Payer: Self-pay | Admitting: Podiatry

## 2019-03-09 DIAGNOSIS — L989 Disorder of the skin and subcutaneous tissue, unspecified: Secondary | ICD-10-CM

## 2019-03-11 NOTE — Progress Notes (Signed)
   Subjective: 68 y.o. female presenting to the office today as a new patient with a chief complaint of shooting pain to the plantar left foot that began a few years ago secondary to callus lesions. Applying pressure to the foot and walking increases the pain. She has been using OTC callus remover, wart remover and soaking the foot in Epsom salt for treatment with no significant relief. Patient is here for further evaluation and treatment.   Past Medical History:  Diagnosis Date  . Allergy   . DDD (degenerative disc disease), lumbar   . Esophageal mass 2015   Found incidently on CXR- Saw ENT and for visualization and nothing was there.   Marland Kitchen GERD (gastroesophageal reflux disease)   . Hyperlipidemia   . Osteoporosis      Objective:  Physical Exam General: Alert and oriented x3 in no acute distress  Dermatology: Hyperkeratotic lesion(s) present on the left foot. Pain on palpation with a central nucleated core noted. Skin is warm, dry and supple bilateral lower extremities. Negative for open lesions or macerations.  Vascular: Palpable pedal pulses bilaterally. No edema or erythema noted. Capillary refill within normal limits.  Neurological: Epicritic and protective threshold grossly intact bilaterally.   Musculoskeletal Exam: Pain on palpation at the keratotic lesion(s) noted. Range of motion within normal limits bilateral. Muscle strength 5/5 in all groups bilateral.  Assessment: 1. Pre-ulcerative callus lesions noted to the left foot x 2    Plan of Care:  1. Patient evaluated 2. Excisional debridement of keratoic lesion using a chisel blade was performed without incident.  3. Dressed area with light dressing. 4. Recommended OTC corn and callus remover.  5. Patient is to return to the clinic PRN.   Edrick Kins, DPM [facility]  Dr. Edrick Kins, [address]                                        [place], [place] [address]                Office  [phone]  Fax [phone]

## 2019-03-22 ENCOUNTER — Encounter: Payer: Self-pay | Admitting: Family Medicine

## 2019-03-22 ENCOUNTER — Other Ambulatory Visit: Payer: Self-pay

## 2019-03-22 ENCOUNTER — Ambulatory Visit (INDEPENDENT_AMBULATORY_CARE_PROVIDER_SITE_OTHER): Payer: Medicare Other | Admitting: Family Medicine

## 2019-03-22 VITALS — BP 132/77 | HR 71 | Temp 98.1°F | Ht 63.0 in | Wt 149.0 lb

## 2019-03-22 DIAGNOSIS — F419 Anxiety disorder, unspecified: Secondary | ICD-10-CM

## 2019-03-22 DIAGNOSIS — Z0000 Encounter for general adult medical examination without abnormal findings: Secondary | ICD-10-CM | POA: Diagnosis not present

## 2019-03-22 DIAGNOSIS — Z23 Encounter for immunization: Secondary | ICD-10-CM

## 2019-03-22 DIAGNOSIS — E782 Mixed hyperlipidemia: Secondary | ICD-10-CM | POA: Diagnosis not present

## 2019-03-22 DIAGNOSIS — Z1239 Encounter for other screening for malignant neoplasm of breast: Secondary | ICD-10-CM

## 2019-03-22 DIAGNOSIS — Z1159 Encounter for screening for other viral diseases: Secondary | ICD-10-CM | POA: Diagnosis not present

## 2019-03-22 DIAGNOSIS — Z1211 Encounter for screening for malignant neoplasm of colon: Secondary | ICD-10-CM

## 2019-03-22 DIAGNOSIS — E559 Vitamin D deficiency, unspecified: Secondary | ICD-10-CM

## 2019-03-22 LAB — UA/M W/RFLX CULTURE, ROUTINE
Bilirubin, UA: NEGATIVE
Glucose, UA: NEGATIVE
Leukocytes,UA: NEGATIVE
Nitrite, UA: NEGATIVE
Protein,UA: NEGATIVE
RBC, UA: NEGATIVE
Specific Gravity, UA: <1.005 — ABNORMAL LOW (ref 1.005–1.030)
Urobilinogen, Ur: 0.2 mg/dL (ref 0.2–1.0)
pH, UA: 5 (ref 5.0–7.5)

## 2019-03-22 MED ORDER — DICLOFENAC SODIUM 1 % TD GEL: 2.0000 g | Freq: Four times a day (QID) | TRANSDERMAL | 12 refills | Status: DC

## 2019-03-22 NOTE — Patient Instructions (Addendum)
Call for mammogram:  [facility]  Address: [address], [place], [place] [address]  Phone: [phone]  Health Maintenance After Age 70 After age 91, you are at a higher risk for certain long-term diseases and infections as well as injuries from falls. Falls are a major cause of broken bones and head injuries in people who are older than age 47. Getting regular preventive care can help to keep you healthy and well. Preventive care includes getting regular testing and making lifestyle changes as recommended by your health care provider. Talk with your health care provider about:  Which screenings and tests you should have. A screening is a test that checks for a disease when you have no symptoms.  A diet and exercise plan that is right for you. What should I know about screenings and tests to prevent falls? Screening and testing are the best ways to find a health problem early. Early diagnosis and treatment give you the best chance of managing medical conditions that are common after age 88. Certain conditions and lifestyle choices may make you more likely to have a fall. Your health care provider may recommend:  Regular vision checks. Poor vision and conditions such as cataracts can make you more likely to have a fall. If you wear glasses, make sure to get your prescription updated if your vision changes.  Medicine review. Work with your health care provider to regularly review all of the medicines you are taking, including over-the-counter medicines. Ask your health care provider about any side effects that may make you more likely to have a fall. Tell your health care provider if any medicines that you take make you feel dizzy or sleepy.  Osteoporosis screening. Osteoporosis is a condition that causes the bones to get weaker. This can make the bones weak and cause them to break more easily.  Blood pressure screening. Blood pressure changes and  medicines to control blood pressure can make you feel dizzy.  Strength and balance checks. Your health care provider may recommend certain tests to check your strength and balance while standing, walking, or changing positions.  Foot health exam. Foot pain and numbness, as well as not wearing proper footwear, can make you more likely to have a fall.  Depression screening. You may be more likely to have a fall if you have a fear of falling, feel emotionally low, or feel unable to do activities that you used to do.  Alcohol use screening. Using too much alcohol can affect your balance and may make you more likely to have a fall. What actions can I take to lower my risk of falls? General instructions  Talk with your health care provider about your risks for falling. Tell your health care provider if: ? You fall. Be sure to tell your health care provider about all falls, even ones that seem minor. ? You feel dizzy, sleepy, or off-balance.  Take over-the-counter and prescription medicines only as told by your health care provider. These include any supplements.  Eat a healthy diet and maintain a healthy weight. A healthy diet includes low-fat dairy products, low-fat (lean) meats, and fiber from whole grains, beans, and lots of fruits and vegetables. Home safety  Remove any tripping hazards, such as rugs, cords, and clutter.  Install safety equipment such as grab bars in bathrooms and safety rails on stairs.  Keep rooms and walkways well-lit. Activity   Follow a regular exercise program to stay fit. This will  help you maintain your balance. Ask your health care provider what types of exercise are appropriate for you.  If you need a cane or walker, use it as recommended by your health care provider.  Wear supportive shoes that have nonskid soles. Lifestyle  Do not drink alcohol if your health care provider tells you not to drink.  If you drink alcohol, limit how much you have: ? 0-1  drink a day for women. ? 0-2 drinks a day for men.  Be aware of how much alcohol is in your drink. In the [place] one drink equals one typical bottle of beer (12 oz), one-half glass of wine (5 oz), or one shot of hard liquor (1 oz).  Do not use any products that contain nicotine or tobacco, such as cigarettes and e-cigarettes. If you need help quitting, ask your health care provider. Summary  Having a healthy lifestyle and getting preventive care can help to protect your health and wellness after age 56.  Screening and testing are the best way to find a health problem early and help you avoid having a fall. Early diagnosis and treatment give you the best chance for managing medical conditions that are more common for people who are older than age 82.  Falls are a major cause of broken bones and head injuries in people who are older than age 59. Take precautions to prevent a fall at home.  Work with your health care provider to learn what changes you can make to improve your health and wellness and to prevent falls. This information is not intended to replace advice given to you by your health care provider. Make sure you discuss any questions you have with your health care provider. Document Released: 05/07/2017 Document Revised: 10/15/2018 Document Reviewed: 05/07/2017 Elsevier Patient Education  2020 [place] tunnel syndrome is a condition that causes pain in your hand and arm. The carpal tunnel is a narrow area located on the palm side of your wrist. Repeated wrist motion or certain diseases may cause swelling within the tunnel. This swelling pinches the main nerve in the wrist (median nerve). What are the causes? This condition may be caused by:  Repeated wrist motions.  Wrist injuries.  Arthritis.  A cyst or tumor in the carpal tunnel.  Fluid buildup during pregnancy. Sometimes the cause of this condition is not known. What increases the  risk? The following factors may make you more likely to develop this condition:  Having a job, such as being a Research scientist (life sciences), that requires you to repeatedly move your wrist in the same motion.  Being a woman.  Having certain conditions, such as: ? Diabetes. ? Obesity. ? An underactive thyroid (hypothyroidism). ? Kidney failure. What are the signs or symptoms? Symptoms of this condition include:  A tingling feeling in your fingers, especially in your thumb, index, and middle fingers.  Tingling or numbness in your hand.  An aching feeling in your entire arm, especially when your wrist and elbow are bent for a long time.  Wrist pain that goes up your arm to your shoulder.  Pain that goes down into your palm or fingers.  A weak feeling in your hands. You may have trouble grabbing and holding items. Your symptoms may feel worse during the night. How is this diagnosed? This condition is diagnosed with a medical history and physical exam. You may also have tests, including:  Electromyogram (EMG). This test measures electrical signals  sent by your nerves into the muscles.  Nerve conduction study. This test measures how well electrical signals pass through your nerves.  Imaging tests, such as X-rays, ultrasound, and MRI. These tests check for possible causes of your condition. How is this treated? This condition may be treated with:  Lifestyle changes. It is important to stop or change the activity that caused your condition.  Doing exercise and activities to strengthen your muscles and bones (physical therapy).  Learning how to use your hand again after diagnosis (occupational therapy).  Medicines for pain and inflammation. This may include medicine that is injected into your wrist.  A wrist splint.  Surgery. Follow these instructions at home: If you have a splint:  Wear the splint as told by your health care provider. Remove it only as told by your health care  provider.  Loosen the splint if your fingers tingle, become numb, or turn cold and blue.  Keep the splint clean.  If the splint is not waterproof: ? Do not let it get wet. ? Cover it with a watertight covering when you take a bath or shower. Managing pain, stiffness, and swelling   If directed, put ice on the painful area: ? If you have a removable splint, remove it as told by your health care provider. ? Put ice in a plastic bag. ? Place a towel between your skin and the bag. ? Leave the ice on for 20 minutes, 2-3 times per day. General instructions  Take over-the-counter and prescription medicines only as told by your health care provider.  Rest your wrist from any activity that may be causing your pain. If your condition is work related, talk with your employer about changes that can be made, such as getting a wrist pad to use while typing.  Do any exercises as told by your health care provider, physical therapist, or occupational therapist.  Keep all follow-up visits as told by your health care provider. This is important. Contact a health care provider if:  You have new symptoms.  Your pain is not controlled with medicines.  Your symptoms get worse. Get help right away if:  You have severe numbness or tingling in your wrist or hand. Summary  Carpal tunnel syndrome is a condition that causes pain in your hand and arm.  It is usually caused by repeated wrist motions.  Lifestyle changes and medicines are used to treat carpal tunnel syndrome. Surgery may be recommended.  Follow your health care provider's instructions about wearing a splint, resting from activity, keeping follow-up visits, and calling for help. This information is not intended to replace advice given to you by your health care provider. Make sure you discuss any questions you have with your health care provider. Document Released: 06/21/2000 Document Revised: 10/31/2017 Document Reviewed: 10/31/2017  Elsevier Patient Education  [place].  Wrist and Forearm Exercises Ask your health care provider which exercises are safe for you. Do exercises exactly as told by your health care provider and adjust them as directed. It is normal to feel mild stretching, pulling, tightness, or discomfort as you do these exercises. Stop right away if you feel sudden pain or your pain gets worse. Do not begin these exercises until told by your health care provider. Range-of-motion exercises These exercises warm up your muscles and joints and improve the movement and flexibility of your injured wrist and forearm. These exercises also help to relieve pain, numbness, and tingling. These exercises are done using the muscles in  your injured wrist and forearm. Wrist flexion 1. Bend your left / right elbow to a 90-degree angle (right angle) with your palm facing the floor. 2. Bend your wrist so that your fingers point toward the floor (flexion). 3. Hold this position for __________ seconds. 4. Slowly return to the starting position. Repeat __________ times. Complete this exercise __________ times a day. Wrist extension 1. Bend your left / right elbow to a 90-degree angle (right angle) with your palm facing the floor. 2. Bend your wrist so that your fingers point toward the ceiling (extension). 3. Hold this position for __________ seconds. 4. Slowly return to the starting position. Repeat __________ times. Complete this exercise __________ times a day. Ulnar deviation 1. Bend your left / right elbow to a 90-degree angle (right angle), and rest your forearm on a table with your palm facing down. 2. Keeping your hand flat on the table, bend your left /right wrist toward your small finger (pinkie). This is ulnar deviation. 3. Hold this position for __________ seconds. 4. Slowly return to the starting position. Repeat __________ times. Complete this exercise __________ times a day. Radial deviation 1. Bend  your left / right elbow to a 90-degree angle (right angle), and rest your forearm on a table with your palm facing down. 2. Keeping your hand flat on the table, bend your left /right wrist toward your thumb. This is radial deviation. 3. Hold this position for __________ seconds. 4. Slowly return to the starting position. Repeat __________ times. Complete this exercise __________ times a day. Forearm rotation, supination  1. Sit with your left / right elbow bent to a 90-degree angle (right angle). Position your forearm so that the thumb is facing the ceiling (neutral position). 2. Turn (rotate) your palm up toward the ceiling (supination), stopping when you feel a gentle stretch. 3. Hold this position for __________ seconds. 4. Slowly return to the starting position. Repeat __________ times. Complete this exercise __________ times a day. Forearm rotation, pronation  1. Sit with your left / right elbow bent to a 90-degree angle (right angle). Position your forearm so that the thumb is facing the ceiling (neutral position). 2. Rotate your palm down toward the floor (pronation), stopping when you feel a gentle stretch. 3. Hold this position for __________ seconds. 4. Slowly return to the starting position. Repeat __________ times. Complete this exercise __________ times a day. Stretching These exercises warm up your muscles and joints and improve the movement and flexibility of your injured wrist and forearm. These exercises also help to relieve pain, numbness, and tingling. These exercises are done using your healthy wrist and forearm to help stretch the muscles in your injured wrist and forearm. Wrist flexion  1. Extend your left / right arm in front of you, and turn your palm down toward the floor. ? If told by your health care provider, bend your left / right elbow to a 90-degree angle (right angle) at your side. 2. Using your uninjured hand, gently press over the back of your left / right  hand to bend your wrist and fingers toward the floor (flexion). Go as far as you can to feel a stretch without causing pain. 3. Hold this position for __________ seconds. 4. Slowly return to the starting position. Repeat __________ times. Complete this exercise __________ times a day. Wrist extension  1. Extend your left / right arm in front of you and turn your palm up toward the ceiling. ? If told by your health  care provider, bend your left / right elbow to a 90-degree angle (right angle) at your side. 2. Using your uninjured hand, gently press over the palm of your left / right hand to bend your wrist and fingers toward the floor (extension). Go as far as you can to feel a stretch without causing pain. 3. Hold this position for __________ seconds. 4. Slowly return to the starting position. Repeat __________ times. Complete this exercise __________ times a day. Forearm rotation, supination 1. Sit with your left / right elbow bent to a 90-degree angle (right angle). Position your forearm so that the thumb is facing the ceiling (neutral position). 2. Rotate your palm up toward the ceiling as far as you can on your own (supination). Then, use your uninjured hand to help turn your forearm more, stopping when you feel a gentle stretch. 3. Hold this position for __________ seconds. 4. Slowly return to the starting position. Repeat __________ times. Complete this exercise __________ times a day. Forearm rotation, pronation 1. Sit with your left / right elbow bent to a 90-degree angle (right angle). Position your forearm so that the thumb is facing the ceiling (neutral position). 2. Rotate your palm down toward the floor as far as you can on your own (pronation). Then, use your uninjured hand to help turn your forearm more, stopping when you feel a gentle stretch. 3. Hold this position for __________ seconds. 4. Slowly return to the starting position. Repeat __________ times. Complete this exercise  __________ times a day. Strengthening exercises These exercises build strength and endurance in your wrist and forearm. Endurance is the ability to use your muscles for a long time, even after they get tired. Wrist flexion  1. Sit with your left / right forearm supported on a table or other surface. Bend your elbow to a 90-degree angle (right angle), and rest your hand palm-up over the edge of the table. 2. Hold a __________ weight in your left / right hand. Or, hold an exercise band or tube in both hands, keeping your hands at the same level and hip distance apart. There should be a slight tension in the exercise band or tube. 3. Slowly curl your hand up toward the ceiling (flexion). 4. Hold this position for __________ seconds. 5. Slowly lower your hand back to the starting position. Repeat __________ times. Complete this exercise __________ times a day. Wrist extension  1. Sit with your left / right forearm supported on a table or other surface. Bend your elbow to a 90-degree angle (right angle), and rest your hand palm-down over the edge of the table. 2. Hold a __________ weight in your left / right hand. Or, hold an exercise band or tube in both hands, keeping your hands at the same level and hip distance apart. There should be a slight tension in the exercise band or tube. 3. Slowly curl your hand up toward the ceiling (extension). 4. Hold this position for __________ seconds. 5. Slowly lower your hand back to the starting position. Repeat __________ times. Complete this exercise __________ times a day. Forearm rotation, supination  1. Sit with your left / right forearm supported on a table or other surface. Bend your elbow to a 90-degree angle (right angle). Position your forearm so that your thumb is facing the ceiling (neutral position) and your hand is resting over the edge of the table. 2. Hold a hammer in your left / right hand. ? This exercise will be easier if you hold  the hammer  near the head of the hammer. ? This exercise will be harder if you hold the hammer near the end of the handle. 3. Without moving your elbow, slowly rotate your palm up toward the ceiling (supination). 4. Hold this position for __________ seconds. 5. Slowly return to the starting position. Repeat __________ times. Complete this exercise __________ times a day. Forearm rotation, pronation  1. Sit with your left / right forearm supported on a table or other surface. Bend your elbow to a 90-degree angle (right angle). Position your forearm so that the thumb is facing the ceiling (neutral position), with your hand resting over the edge of the table. 2. Hold a hammer in your left / right hand. ? This exercise will be easier if you hold the hammer near the head of the hammer. ? This exercise will be harder if you hold the hammer near the end of the handle. 3. Without moving your elbow, slowly rotate your palm down toward the floor (pronation). 4. Hold this position for __________ seconds. 5. Slowly return to the starting position. Repeat __________ times. Complete this exercise __________ times a day. Grip strengthening  1. Grasp a stress ball or other ball in the middle of your left / right hand. Start with your elbow bent to a 90-degree angle (right angle). 2. Slowly increase the pressure, squeezing the ball as hard as you can without causing pain. ? Think of bringing the tips of your fingers into the middle of your palm. All of your finger joints should bend when doing this exercise. ? To make this exercise harder, gradually try to straighten your elbow in front of you, until you can do the exercise with your elbow fully straight. 3. Hold your squeeze for __________ seconds, then relax. If instructed by your health care provider, do this exercise: ? With your forearm positioned so that the thumb is facing the ceiling (neutral position). ? With your forearm turned palm down. ? With your forearm  turned palm up. Repeat __________ times. Complete this exercise __________ times a day. This information is not intended to replace advice given to you by your health care provider. Make sure you discuss any questions you have with your health care provider. Document Released: 05/08/2005 Document Revised: 08/13/2018 Document Reviewed: 08/13/2018 Elsevier Patient Education  [place].

## 2019-03-22 NOTE — Assessment & Plan Note (Signed)
Labs drawn today. Await results.  

## 2019-03-22 NOTE — Progress Notes (Signed)
BP 132/77   Pulse 71   Temp 98.1 F (36.7 C) (Oral)   Ht 5\' 3"  (1.6 m)   Wt 149 lb (67.6 kg)   SpO2 99%   BMI 26.39 kg/m    Subjective:    Patient ID: Angie Copeland, female    DOB: 07-26-1950, 68 y.o.   MRN: [id number]  HPI: Angie Copeland is a 68 y.o. female presenting on 03/22/2019 for comprehensive medical examination. Current medical complaints include:  HYPERLIPIDEMIA Hyperlipidemia status: stable Satisfied with current treatment?  yes Side effects:  Not on anything Supplements: none Aspirin:  no The 10-year ASCVD risk score Mikey Bussing DC Jr., et al., 2013) is: 9%   Values used to calculate the score:     Age: 89 years     Sex: Female     Is Non-Hispanic African American: No     Diabetic: No     Tobacco smoker: No     Systolic Blood Pressure: [id number] mmHg     Is BP treated: No     HDL Cholesterol: 87 mg/dL     Total Cholesterol: 268 mg/dL Chest pain:  no Coronary artery disease:  no  ANXIETY/STRESS Duration:stable Anxious mood: yes  Excessive worrying: no Irritability: no  Sweating: no Nausea: no Palpitations:no Hyperventilation: no Panic attacks: no Agoraphobia: no  Obscessions/compulsions: no Depressed mood: no Depression screen [facility] 2/9 03/22/2019 12/23/2018 12/19/2017 12/17/2016 10/15/2016  Decreased Interest 0 0 0 0 0  Down, Depressed, Hopeless 0 1 0 0 0  PHQ - 2 Score 0 1 0 0 0  Altered sleeping 1 - 2 - -  Tired, decreased energy 0 - 0 - -  Change in appetite 0 - 2 - -  Feeling bad or failure about yourself  0 - 0 - -  Trouble concentrating 0 - 0 - -  Moving slowly or fidgety/restless 0 - 0 - -  Suicidal thoughts 0 - 0 - -  PHQ-9 Score 1 - 4 - -  Difficult doing work/chores Not difficult at all - Not difficult at all - -   Anhedonia: no Weight changes: no Insomnia: yes hard to stay asleep  Hypersomnia: no Fatigue/loss of energy: yes Feelings of worthlessness: no Feelings of guilt: no Impaired concentration/indecisiveness: no Suicidal ideations: no   Crying spells: no Recent Stressors/Life Changes: yes   Relationship problems: no   Family stress: no     Financial stress: no    Job stress: no    Recent death/loss: no  Menopausal Symptoms: no  Depression Screen done today and results listed below:  Depression screen [facility] 2/9 03/22/2019 12/23/2018 12/19/2017 12/17/2016 10/15/2016  Decreased Interest 0 0 0 0 0  Down, Depressed, Hopeless 0 1 0 0 0  PHQ - 2 Score 0 1 0 0 0  Altered sleeping 1 - 2 - -  Tired, decreased energy 0 - 0 - -  Change in appetite 0 - 2 - -  Feeling bad or failure about yourself  0 - 0 - -  Trouble concentrating 0 - 0 - -  Moving slowly or fidgety/restless 0 - 0 - -  Suicidal thoughts 0 - 0 - -  PHQ-9 Score 1 - 4 - -  Difficult doing work/chores Not difficult at all - Not difficult at all - -    Past Medical History:  Past Medical History:  Diagnosis Date  . Allergy   . DDD (degenerative disc disease), lumbar   . Esophageal mass 2015  Found incidently on CXR- Saw ENT and for visualization and nothing was there.   Marland Kitchen GERD (gastroesophageal reflux disease)   . Hyperlipidemia   . Osteoporosis     Surgical History:  Past Surgical History:  Procedure Laterality Date  . EYE SURGERY     cataract surgery   . NOSE SURGERY     Cartilage removed from left side of nose    Medications:  No current outpatient medications on file prior to visit.   No current facility-administered medications on file prior to visit.     Allergies:  Allergies  Allergen Reactions  . Penicillins Rash    Social History:  Social History   Socioeconomic History  . Marital status: Divorced    Spouse name: Not on file  . Number of children: Not on file  . Years of education: Not on file  . Highest education level: Bachelor's degree (e.g., BA, AB, BS)  Occupational History  . Not on file  Social Needs  . Financial resource strain: Not hard at all  . Food insecurity    Worry: Never true    Inability: Never true  .  Transportation needs    Medical: No    Non-medical: No  Tobacco Use  . Smoking status: Former Smoker    Types: Cigarettes    Quit date: 07/09/1971    Years since quitting: 47.7  . Smokeless tobacco: Never Used  Substance and Sexual Activity  . Alcohol use: Not Currently  . Drug use: No  . Sexual activity: Never  Lifestyle  . Physical activity    Days per week: 1 day    Minutes per session: 120 min  . Stress: Not at all  Relationships  . Social connections    Talks on phone: More than three times a week    Gets together: More than three times a week    Attends religious service: More than 4 times per year    Active member of club or organization: Yes    Attends meetings of clubs or organizations: More than 4 times per year    Relationship status: Divorced  . Intimate partner violence    Fear of current or ex partner: No    Emotionally abused: No    Physically abused: No    Forced sexual activity: No  Other Topics Concern  . Not on file  Social History Narrative  . Not on file   Social History   Tobacco Use  Smoking Status Former Smoker  . Types: Cigarettes  . Quit date: 07/09/1971  . Years since quitting: 47.7  Smokeless Tobacco Never Used   Social History   Substance and Sexual Activity  Alcohol Use Not Currently    Family History:  Family History  Problem Relation Age of Onset  . Heart disease Mother   . Diabetes Mother   . Hiatal hernia Mother   . Arthritis Mother   . Cancer Father 74       Colon  . Colitis Sister   . Irritable bowel syndrome Sister   . Arthritis Sister   . Diabetes Brother   . Stroke Maternal Grandmother   . Pneumonia Maternal Grandmother   . Kidney failure Paternal Grandmother   . Leukemia Paternal Grandfather   . Cancer Paternal Grandfather        Leukemia  . Heart disease Sister   . Irregular heart beat Sister   . Thyroid disease Sister     Past medical history, surgical history, medications,  allergies, family history and  social history reviewed with patient today and changes made to appropriate areas of the chart.   Review of Systems  Constitutional: Negative.   HENT: Negative.   Eyes: Negative.   Respiratory: Negative.   Cardiovascular: Negative.   Gastrointestinal: Negative.   Genitourinary: Negative.        Smell can be worse Has had a couple of episodes of incontience   Musculoskeletal: Positive for joint pain and myalgias (first 3 fingers of both hands).. Negative for back pain, falls and neck pain.  Skin: Negative.   Neurological: Positive for tingling (first 3 fingers of both hands).. Negative for dizziness, tremors, sensory change, speech change, focal weakness, seizures, loss of consciousness, weakness and headaches.  Endo/Heme/Allergies: Negative for environmental allergies and polydipsia. Bruises/bleeds easily.  Psychiatric/Behavioral: Negative for depression, hallucinations, memory loss, substance abuse and suicidal ideas. The patient is nervous/anxious. The patient does not have insomnia.     All other ROS negative except what is listed above and in the HPI.      Objective:    BP 132/77   Pulse 71   Temp 98.1 F (36.7 C) (Oral)   Ht 5\' 3"  (1.6 m)   Wt 149 lb (67.6 kg)   SpO2 99%   BMI 26.39 kg/m   Wt Readings from Last 3 Encounters:  03/22/19 149 lb (67.6 kg)  12/23/18 148 lb (67.1 kg)  02/10/18 157 lb (71.2 kg)    Physical Exam Vitals signs and nursing note reviewed.  Constitutional:      General: She is not in acute distress.    Appearance: Normal appearance. She is not ill-appearing, toxic-appearing or diaphoretic.  HENT:     Head: Normocephalic and atraumatic.     Right Ear: Tympanic membrane, ear canal and external ear normal. There is no impacted cerumen.     Left Ear: Tympanic membrane, ear canal and external ear normal. There is no impacted cerumen.     Nose: Nose normal. No congestion or rhinorrhea.     Mouth/Throat:     Mouth: Mucous membranes are moist.      Pharynx: Oropharynx is clear. No oropharyngeal exudate or posterior oropharyngeal erythema.  Eyes:     General: No scleral icterus.       Right eye: No discharge.        Left eye: No discharge.     Extraocular Movements: Extraocular movements intact.     Conjunctiva/sclera: Conjunctivae normal.     Pupils: Pupils are equal, round, and reactive to light.  Neck:     Musculoskeletal: Normal range of motion and neck supple. No neck rigidity or muscular tenderness.     Vascular: No carotid bruit.  Cardiovascular:     Rate and Rhythm: Normal rate and regular rhythm.     Pulses: Normal pulses.     Heart sounds: No murmur. No friction rub. No gallop.   Pulmonary:     Effort: Pulmonary effort is normal. No respiratory distress.     Breath sounds: Normal breath sounds. No stridor. No wheezing, rhonchi or rales.  Chest:     Chest wall: No tenderness.  Abdominal:     General: Abdomen is flat. Bowel sounds are normal. There is no distension.     Palpations: Abdomen is soft. There is no mass.     Tenderness: There is no abdominal tenderness. There is no right CVA tenderness, left CVA tenderness, guarding or rebound.     Hernia: No hernia is present.  Genitourinary:    Comments: Breast and pelvic exams deferred with shared decision making Musculoskeletal:        General: No swelling, tenderness, deformity or signs of injury.     Right lower leg: No edema.     Left lower leg: No edema.  Lymphadenopathy:     Cervical: No cervical adenopathy.  Skin:    General: Skin is warm and dry.     Capillary Refill: Capillary refill takes less than 2 seconds.     Coloration: Skin is not jaundiced or pale.     Findings: No bruising, erythema, lesion or rash.  Neurological:     General: No focal deficit present.     Mental Status: She is alert and oriented to person, place, and time. Mental status is at baseline.     Cranial Nerves: No cranial nerve deficit.     Sensory: No sensory deficit.     Motor: No  weakness.     Coordination: Coordination normal.     Gait: Gait normal.     Deep Tendon Reflexes: Reflexes normal.  Psychiatric:        Mood and Affect: Mood normal.        Behavior: Behavior normal.        Thought Content: Thought content normal.        Judgment: Judgment normal.     Results for orders placed or performed in visit on 03/22/19  CBC with Differential/Platelet  Result Value Ref Range   WBC 6.3 3.4 - 10.8 x10E3/uL   RBC 4.77 3.77 - 5.28 x10E6/uL   Hemoglobin 14.7 11.1 - 15.9 g/dL   Hematocrit 43.6 34.0 - 46.6 %   MCV 91 79 - 97 fL   MCH 30.8 26.6 - 33.0 pg   MCHC 33.7 31.5 - 35.7 g/dL   RDW 12.1 11.7 - 15.4 %   Platelets 184 150 - 450 x10E3/uL   Neutrophils 55 Not Estab. %   Lymphs 35 Not Estab. %   Monocytes 8 Not Estab. %   Eos 1 Not Estab. %   Basos 1 Not Estab. %   Neutrophils Absolute 3.5 1.4 - 7.0 x10E3/uL   Lymphocytes Absolute 2.2 0.7 - 3.1 x10E3/uL   Monocytes Absolute 0.5 0.1 - 0.9 x10E3/uL   EOS (ABSOLUTE) 0.0 0.0 - 0.4 x10E3/uL   Basophils Absolute 0.1 0.0 - 0.2 x10E3/uL   Immature Granulocytes 0 Not Estab. %   Immature Grans (Abs) 0.0 0.0 - 0.1 x10E3/uL  Comprehensive metabolic panel  Result Value Ref Range   Glucose 80 65 - 99 mg/dL   BUN 13 8 - 27 mg/dL   Creatinine, Ser 0.84 0.57 - 1.00 mg/dL   GFR calc non Af Amer 71 >59 mL/min/1.73   GFR calc Af Amer 82 >59 mL/min/1.73   BUN/Creatinine Ratio 15 12 - 28   Sodium 141 134 - 144 mmol/L   Potassium 4.6 3.5 - 5.2 mmol/L   Chloride 102 96 - 106 mmol/L   CO2 22 20 - 29 mmol/L   Calcium 10.1 8.7 - 10.3 mg/dL   Total Protein 7.0 6.0 - 8.5 g/dL   Albumin 4.8 3.8 - 4.8 g/dL   Globulin, Total 2.2 1.5 - 4.5 g/dL   Albumin/Globulin Ratio 2.2 1.2 - 2.2   Bilirubin Total 0.3 0.0 - 1.2 mg/dL   Alkaline Phosphatase 54 39 - 117 IU/L   AST 24 0 - 40 IU/L   ALT 15 0 - 32 IU/L  Lipid Panel w/o Chol/HDL Ratio  Result Value Ref Range   Cholesterol, Total 268 (H) 100 - 199 mg/dL   Triglycerides 76 0 -  149 mg/dL   HDL 87 >39 mg/dL   VLDL Cholesterol Cal 12 5 - 40 mg/dL   LDL Chol Calc (NIH) 169 (H) 0 - 99 mg/dL  TSH  Result Value Ref Range   TSH 2.270 0.450 - 4.500 uIU/mL  UA/M w/rflx Culture, Routine   Specimen: Urine   URINE  Result Value Ref Range   Specific Gravity, UA <1.005 (L) 1.005 - 1.030   pH, UA 5.0 5.0 - 7.5   Color, UA Yellow Yellow   Appearance Ur Clear Clear   Leukocytes,UA Negative Negative   Protein,UA Negative Negative/Trace   Glucose, UA Negative Negative   Ketones, UA Trace (A) Negative   RBC, UA Negative Negative   Bilirubin, UA Negative Negative   Urobilinogen, Ur 0.2 0.2 - 1.0 mg/dL   Nitrite, UA Negative Negative  VITAMIN D 25 Hydroxy (Vit-D Deficiency, Fractures)  Result Value Ref Range   Vit D, 25-Hydroxy 18.3 (L) 30.0 - 100.0 ng/mL  Hepatitis C Antibody  Result Value Ref Range   Hep C Virus Ab <0.1 0.0 - 0.9 s/co ratio      Assessment & Plan:   Problem List Items Addressed This Visit      Other   Hyperlipidemia, unspecified    Stable off medicine, labs drawn today. Continue to monitor. Call with any concerns.       Relevant Orders   CBC with Differential/Platelet (Completed)   Comprehensive metabolic panel (Completed)   Lipid Panel w/o Chol/HDL Ratio (Completed)   UA/M w/rflx Culture, Routine (Completed)   Anxiety    Stable off medicine. Continue to monitor. Call with any concerns.       Relevant Orders   CBC with Differential/Platelet (Completed)   Comprehensive metabolic panel (Completed)   TSH (Completed)   UA/M w/rflx Culture, Routine (Completed)   Vitamin D deficiency    Labs drawn today. Await results.       Relevant Orders   CBC with Differential/Platelet (Completed)   Comprehensive metabolic panel (Completed)   UA/M w/rflx Culture, Routine (Completed)   VITAMIN D 25 Hydroxy (Vit-D Deficiency, Fractures) (Completed)    Other Visit Diagnoses    Routine general medical examination at a health care facility    -   Primary   Vaccines up to date. Screening labs checked today. Referrals for mammogram and colonoscopy done today. Continue diet and exercise. Continue to monitor.    Encounter for hepatitis C screening test for low risk patient       Labs drawn today. Await results.    Relevant Orders   Hepatitis C Antibody (Completed)   Flu vaccine need       Immunization given today.   Relevant Orders   Flu Vaccine QUAD High Dose(Fluad) (Completed)   Colon cancer screening       Referral to GI made today.   Relevant Orders   Ambulatory referral to Gastroenterology   Breast cancer screening       Mammogram ordered today.   Relevant Orders   MM DIGITAL SCREENING BILATERAL   Need for hepatitis C screening test       Labs drawn today. Await results.    Need for pneumococcal vaccine       Immunization given today.   Relevant Orders   Pneumococcal polysaccharide vaccine 23-valent greater than or equal to 2yo subcutaneous/IM (Completed)  Follow up plan: Return in about 1 year (around 03/21/2020) for Physical.   LABORATORY TESTING:  - Pap smear: not applicable  IMMUNIZATIONS:  - Tdap: Tetanus vaccination status reviewed: last tetanus booster within 10 years. - Influenza: Administered today - Pneumovax: Not applicable - Prevnar: Administered today - HPV: Not applicable - Zostavax vaccine: Given elsewhere  SCREENING: -Mammogram: Ordered today  - Colonoscopy: referral generated today  - Bone Density:Up to date    PATIENT COUNSELING:   Advised to take 1 mg of folate supplement per day if capable of pregnancy.   Sexuality: Discussed sexually transmitted diseases, partner selection, use of condoms, avoidance of unintended pregnancy  and contraceptive alternatives.   Advised to avoid cigarette smoking.  I discussed with the patient that most people either abstain from alcohol or drink within safe limits (<=14/week and <=4 drinks/occasion for males, <=7/weeks and <= 3 drinks/occasion  for females) and that the risk for alcohol disorders and other health effects rises proportionally with the number of drinks per week and how often a drinker exceeds daily limits.  Discussed cessation/primary prevention of drug use and availability of treatment for abuse.   Diet: Encouraged to adjust caloric intake to maintain  or achieve ideal body weight, to reduce intake of dietary saturated fat and total fat, to limit sodium intake by avoiding high sodium foods and not adding table salt, and to maintain adequate dietary potassium and calcium preferably from fresh fruits, vegetables, and low-fat dairy products.    stressed the importance of regular exercise  Injury prevention: Discussed safety belts, safety helmets, smoke detector, smoking near bedding or upholstery.   Dental health: Discussed importance of regular tooth brushing, flossing, and dental visits.    NEXT PREVENTATIVE PHYSICAL DUE IN 1 YEAR. Return in about 1 year (around 03/21/2020) for Physical.

## 2019-03-23 LAB — COMPREHENSIVE METABOLIC PANEL
ALT: 15 IU/L (ref 0–32)
AST: 24 IU/L (ref 0–40)
Albumin/Globulin Ratio: 2.2 (ref 1.2–2.2)
Albumin: 4.8 g/dL (ref 3.8–4.8)
Alkaline Phosphatase: 54 IU/L (ref 39–117)
BUN/Creatinine Ratio: 15 (ref 12–28)
BUN: 13 mg/dL (ref 8–27)
Bilirubin Total: 0.3 mg/dL (ref 0.0–1.2)
CO2: 22 mmol/L (ref 20–29)
Calcium: 10.1 mg/dL (ref 8.7–10.3)
Chloride: 102 mmol/L (ref 96–106)
Creatinine, Ser: 0.84 mg/dL (ref 0.57–1.00)
GFR calc Af Amer: 82 mL/min/{1.73_m2} (ref >59)
GFR calc non Af Amer: 71 mL/min/{1.73_m2} (ref >59)
Globulin, Total: 2.2 g/dL (ref 1.5–4.5)
Glucose: 80 mg/dL (ref 65–99)
Potassium: 4.6 mmol/L (ref 3.5–5.2)
Sodium: 141 mmol/L (ref 134–144)
Total Protein: 7 g/dL (ref 6.0–8.5)

## 2019-03-23 LAB — LIPID PANEL W/O CHOL/HDL RATIO
Cholesterol, Total: 268 mg/dL — ABNORMAL HIGH (ref 100–199)
HDL: 87 mg/dL (ref >39)
LDL Chol Calc (NIH): 169 mg/dL — ABNORMAL HIGH (ref 0–99)
Triglycerides: 76 mg/dL (ref 0–149)
VLDL Cholesterol Cal: 12 mg/dL (ref 5–40)

## 2019-03-23 LAB — CBC WITH DIFFERENTIAL/PLATELET
Basophils Absolute: 0.1 10*3/uL (ref 0.0–0.2)
Basos: 1 %
EOS (ABSOLUTE): 0 10*3/uL (ref 0.0–0.4)
Eos: 1 %
Hematocrit: 43.6 % (ref 34.0–46.6)
Hemoglobin: 14.7 g/dL (ref 11.1–15.9)
Immature Grans (Abs): 0 10*3/uL (ref 0.0–0.1)
Immature Granulocytes: 0 %
Lymphocytes Absolute: 2.2 10*3/uL (ref 0.7–3.1)
Lymphs: 35 %
MCH: 30.8 pg (ref 26.6–33.0)
MCHC: 33.7 g/dL (ref 31.5–35.7)
MCV: 91 fL (ref 79–97)
Monocytes Absolute: 0.5 10*3/uL (ref 0.1–0.9)
Monocytes: 8 %
Neutrophils Absolute: 3.5 10*3/uL (ref 1.4–7.0)
Neutrophils: 55 %
Platelets: 184 10*3/uL (ref 150–450)
RBC: 4.77 x10E6/uL (ref 3.77–5.28)
RDW: 12.1 % (ref 11.7–15.4)
WBC: 6.3 10*3/uL (ref 3.4–10.8)

## 2019-03-23 LAB — VITAMIN D 25 HYDROXY (VIT D DEFICIENCY, FRACTURES): Vit D, 25-Hydroxy: 18.3 ng/mL — ABNORMAL LOW (ref 30.0–100.0)

## 2019-03-23 LAB — HEPATITIS C ANTIBODY: Hep C Virus Ab: <0.1 s/co ratio (ref 0.0–0.9)

## 2019-03-23 LAB — TSH: TSH: 2.27 u[IU]/mL (ref 0.450–4.500)

## 2019-03-23 NOTE — Assessment & Plan Note (Signed)
Stable off medicine, labs drawn today. Continue to monitor. Call with any concerns.

## 2019-03-23 NOTE — Assessment & Plan Note (Signed)
Stable off medicine. Continue to monitor. Call with any concerns.

## 2019-03-25 ENCOUNTER — Encounter: Payer: Self-pay | Admitting: *Deleted

## 2019-03-25 ENCOUNTER — Telehealth: Payer: Self-pay | Admitting: Gastroenterology

## 2019-03-25 NOTE — Telephone Encounter (Signed)
Gastroenterology Pre-Procedure Review  Request Date: Tuesday 05/04/2019  [facility] Requesting Physician: Dr. Bonna Gains  PATIENT REVIEW QUESTIONS: The patient responded to the following health history questions as indicated:    1. Are you having any GI issues? no 2. Do you have a personal history of Polyps? no 3. Do you have a family history of Colon Cancer or Polyps? yes (Father  rectal cancer) 4. Diabetes Mellitus? no 5. Joint replacements in the past 12 months?No 6. Major health problems in the past 3 months?no 7. Any artificial heart valves, MVP, or defibrillator?no     BMI: 26.39    Pulmonar disease: NO  MEDICATIONS & ALLERGIES:    Patient reports the following regarding taking any anticoagulation/antiplatelet therapy:   Plavix, Coumadin, Eliquis, Xarelto, Lovenox, Pradaxa, Brilinta, or Effient? no Aspirin? no  Patient confirms/reports the following medications:  Current Outpatient Medications  Medication Sig Dispense Refill  . diclofenac sodium (VOLTAREN) 1 % GEL Apply 2 g topically 4 (four) times daily. 100 g 12   No current facility-administered medications for this visit.     Patient confirms/reports the following allergies:  Allergies  Allergen Reactions  . Penicillins Rash    No orders of the defined types were placed in this encounter.   AUTHORIZATION INFORMATION Primary Insurance: 1D#: Group #:  Secondary Insurance: 1D#: Group #:  SCHEDULE INFORMATION: Date:  Time: Location:

## 2019-03-26 ENCOUNTER — Encounter: Payer: Self-pay | Admitting: Family Medicine

## 2019-03-29 ENCOUNTER — Other Ambulatory Visit: Payer: Self-pay

## 2019-03-29 ENCOUNTER — Telehealth: Payer: Self-pay

## 2019-03-29 DIAGNOSIS — Z1211 Encounter for screening for malignant neoplasm of colon: Secondary | ICD-10-CM

## 2019-03-29 NOTE — Telephone Encounter (Signed)
Opened in Error.

## 2019-04-09 ENCOUNTER — Telehealth: Payer: Self-pay | Admitting: Gastroenterology

## 2019-04-09 NOTE — Telephone Encounter (Signed)
Patient called to cancel her colonoscopy scheduled for 05-04-19. Afraid to have because of the covid virus.

## 2019-04-09 NOTE — Telephone Encounter (Signed)
Returned patients call.  Informed her that we understand and want her comfortable having the colonoscopy.  She has been asked to call us back when she is more comfortable, and we will be happy to reschedule.  LVM with [facility] to cancel.   Thanks [employer]

## 2019-05-04 ENCOUNTER — Ambulatory Visit: Admit: 2019-05-04 | Payer: Medicare Other | Admitting: Gastroenterology

## 2019-05-04 SURGERY — COLONOSCOPY WITH PROPOFOL
Anesthesia: Choice

## 2020-02-14 ENCOUNTER — Other Ambulatory Visit: Payer: Self-pay

## 2020-02-14 ENCOUNTER — Telehealth (INDEPENDENT_AMBULATORY_CARE_PROVIDER_SITE_OTHER): Payer: Medicare PPO | Admitting: Nurse Practitioner

## 2020-02-14 ENCOUNTER — Encounter: Payer: Self-pay | Admitting: Nurse Practitioner

## 2020-02-14 VITALS — Temp 99.1°F | Ht 64.0 in | Wt 150.0 lb

## 2020-02-14 DIAGNOSIS — J069 Acute upper respiratory infection, unspecified: Secondary | ICD-10-CM

## 2020-02-14 DIAGNOSIS — J3089 Other allergic rhinitis: Secondary | ICD-10-CM

## 2020-02-14 DIAGNOSIS — J029 Acute pharyngitis, unspecified: Secondary | ICD-10-CM

## 2020-02-14 MED ORDER — FLUTICASONE PROPIONATE 50 MCG/ACT NA SUSP: 2.0000 | Freq: Every day | NASAL | 6 refills | Status: DC

## 2020-02-14 MED ORDER — BENZONATATE 100 MG PO CAPS: 100.0000 mg | ORAL_CAPSULE | Freq: Two times a day (BID) | ORAL | 0 refills | Status: DC | PRN

## 2020-02-14 MED ORDER — GUAIFENESIN ER 600 MG PO TB12: 600.0000 mg | ORAL_TABLET | Freq: Two times a day (BID) | ORAL | 3 refills | Status: DC | PRN

## 2020-02-14 MED ORDER — CEPACOL SORE THROAT 5.4 MG MT LOZG: 1.0000 | LOZENGE | OROMUCOSAL | 2 refills | Status: DC | PRN

## 2020-02-14 NOTE — Assessment & Plan Note (Signed)
Chronic, ongoing.  Patient reports history of allergies year round and not currently taking allergy medication.  Will start taking allergy medication and monitor for symptom improvement.

## 2020-02-14 NOTE — Progress Notes (Signed)
Temp 99.1 F (37.3 C) (Oral)   Ht 5\' 4"  (1.626 m)   Wt 150 lb (68 kg)   BMI 25.75 kg/m    Subjective:    Patient ID: Angie Copeland, female    DOB: 1951-01-18, 69 y.o.   MRN: [id number]  HPI: Angie Copeland is a 69 y.o. female presenting for upper respiratory infection.  Chief Complaint  Patient presents with  . Cough    Ongoing 3 days.  . Sore Throat  . Chest Congestion  . Diarrhea  . Fatigue   UPPER RESPIRATORY TRACT INFECTION Patient does have a history of allergic rhinitis.  Reports throat is sore and roof of mouth feels raw. Onset: 02/11/2020 Worst symptom: sore throat Fever: low grade 99.1 Cough: yes; productive with green phlegm at times; worse when laying down Shortness of breath: no Wheezing: no Chest pain: no Chest tightness: no Chest congestion: yes Nasal congestion: no Runny nose: no Post nasal drip: yes Sneezing: no Sore throat: yes Swollen glands: yes Sinus pressure: no Headache: yes - Friday but not currently Face pain: no Toothache: no - not new; has had dental work done recently Ear pain: no  Ear pressure: no  Eyes red/itching:no Eye drainage/crusting: no  Nausea: no Vomiting: no  Appetite change: no Rash: no Fatigue: yes Sick contacts: no Strep contacts: no  Context: stable Recurrent sinusitis: no Relief with OTC cold/cough medications: yes  Treatments attempted: Mucinex 12 hour;   Allergies  Allergen Reactions  . Penicillins Rash   Outpatient Encounter Medications as of 02/14/2020  Medication Sig  . diclofenac sodium (VOLTAREN) 1 % GEL Apply 2 g topically 4 (four) times daily.  . benzonatate (TESSALON) 100 MG capsule Take 1 capsule (100 mg total) by mouth 2 (two) times daily as needed for cough.  . fluticasone (FLONASE) 50 MCG/ACT nasal spray Place 2 sprays into both nostrils daily.  Marland Kitchen guaiFENesin (MUCINEX) 600 MG 12 hr tablet Take 1 tablet (600 mg total) by mouth 2 (two) times daily as needed for cough or to loosen phlegm.  .  Menthol (CEPACOL SORE THROAT) 5.4 MG LOZG Use as directed 1 lozenge (5.4 mg total) in the mouth or throat as needed (sore throat).   No facility-administered encounter medications on file as of 02/14/2020.   Patient Active Problem List   Diagnosis Date Noted  . Sore throat 02/14/2020  . Vitamin D deficiency 12/19/2016  . Advance directive discussed with patient 12/17/2016  . Rosacea 10/15/2016  . Allergic rhinitis 10/15/2016  . GE reflux 10/15/2016  . Hyperlipidemia, unspecified 10/15/2016  . Osteoporosis 10/15/2016  . Fever blister 10/15/2016  . Anxiety 10/15/2016  . DDD (degenerative disc disease), lumbar 01/20/2012   Past Medical History:  Diagnosis Date  . Allergy   . DDD (degenerative disc disease), lumbar   . Esophageal mass 2015   Found incidently on CXR- Saw ENT and for visualization and nothing was there.   Marland Kitchen GERD (gastroesophageal reflux disease)   . Hyperlipidemia   . Osteoporosis     Relevant past medical, surgical, family and social history reviewed and updated as indicated. Interim medical history since our last visit reviewed.  Review of Systems  Constitutional: Positive for fatigue. Negative for activity change, appetite change, chills and fever.  HENT: Positive for congestion, postnasal drip, sore throat and voice change. Negative for ear pain, mouth sores, nosebleeds, rhinorrhea, sinus pressure, sinus pain, sneezing and trouble swallowing.   Eyes: Negative.  Negative for pain, discharge, redness and itching.  Respiratory: Positive for cough. Negative for chest tightness, shortness of breath and wheezing.   Cardiovascular: Negative.  Negative for chest pain.  Gastrointestinal: Negative.  Negative for nausea and vomiting.  Skin: Negative.  Negative for color change and rash.  Neurological: Negative.  Negative for dizziness, weakness and headaches.  Hematological: Positive for adenopathy.  Psychiatric/Behavioral: Negative.  Negative for confusion, decreased  concentration and sleep disturbance.    Per HPI unless specifically indicated above     Objective:    Temp 99.1 F (37.3 C) (Oral)   Ht 5\' 4"  (1.626 m)   Wt 150 lb (68 kg)   BMI 25.75 kg/m   Wt Readings from Last 3 Encounters:  02/14/20 150 lb (68 kg)  03/22/19 149 lb (67.6 kg)  12/23/18 148 lb (67.1 kg)    Physical Exam Vitals and nursing note reviewed.  Constitutional:      General: She is not in acute distress.    Appearance: Normal appearance. She is not ill-appearing, toxic-appearing or diaphoretic.  HENT:     Head: Normocephalic and atraumatic.     Nose: Nose normal. No congestion or rhinorrhea.     Mouth/Throat:     Mouth: Mucous membranes are moist.     Pharynx: Oropharynx is clear. Posterior oropharyngeal erythema present.  Eyes:     General: No scleral icterus.       Right eye: No discharge.        Left eye: No discharge.     Extraocular Movements: Extraocular movements intact.     Conjunctiva/sclera: Conjunctivae normal.  Cardiovascular:     Comments: Unable to assess heart sounds via virtual visit Pulmonary:     Effort: Pulmonary effort is normal. No respiratory distress.     Comments: Unable to assess lung sounds via virtual visit - talking in complete sentences Musculoskeletal:        General: Normal range of motion.  Skin:    Coloration: Skin is not jaundiced or pale.     Findings: No erythema.  Neurological:     General: No focal deficit present.     Mental Status: She is alert and oriented to person, place, and time.  Psychiatric:        Mood and Affect: Mood normal.        Behavior: Behavior normal.        Thought Content: Thought content normal.        Judgment: Judgment normal.       Assessment & Plan:   Problem List Items Addressed This Visit      Respiratory   Allergic rhinitis    Chronic, ongoing.  Patient reports history of allergies year round and not currently taking allergy medication.  Will start taking allergy medication and  monitor for symptom improvement.        Other   Sore throat - Primary    Acute, ongoing. Unclear etiology although at this point, viral illness seems likely.  Encouraged COVID-19 testing to rule out.  Otherwise, treat symptomatic management with hydration, rest, guaifenesin, allergy medications, cough suppressant, and throat lozenges.  If not better by end of week, return to clinic.  Encouraged to wear mask in all public places.  With any chest pain or shortness of breath, go to ER.       Other Visit Diagnoses    Upper respiratory tract infection, unspecified type           Follow up plan: Return if symptoms worsen or fail to improve.  Due to the catastrophic nature of the COVID-19 pandemic, this visit was completed via audio and visual contact via Mychart due to the restrictions of the COVID-19 pandemic. All issues as above were discussed and addressed. Physical exam was done as above through visual confirmation on Mychart. If it was felt that the patient should be evaluated in the office, they were directed there. The patient verbally consented to this visit."} . Location of the patient: home . Location of the provider: work . Those involved with this call:  . Provider: Carnella Guadalajara, DNP . CMA: Merilyn Baba, CMA . Front Desk/Registration: Don Perking  . Time spent on call: 20 minutes with patient face to face via video conference. More than 50% of this time was spent in counseling and coordination of care. 15 minutes total spent in review of patient's record and preparation of their chart.  I verified patient identity using two factors (patient name and date of birth). Patient consents verbally to being seen via telemedicine visit today.

## 2020-02-14 NOTE — Patient Instructions (Signed)

## 2020-02-14 NOTE — Assessment & Plan Note (Signed)
Acute, ongoing. Unclear etiology although at this point, viral illness seems likely.  Encouraged COVID-19 testing to rule out.  Otherwise, treat symptomatic management with hydration, rest, guaifenesin, allergy medications, cough suppressant, and throat lozenges.  If not better by end of week, return to clinic.  Encouraged to wear mask in all public places.  With any chest pain or shortness of breath, go to ER.

## 2020-02-18 ENCOUNTER — Telehealth: Payer: Self-pay | Admitting: Family Medicine

## 2020-03-23 ENCOUNTER — Encounter: Payer: Medicare PPO | Admitting: Family Medicine

## 2020-12-12 ENCOUNTER — Ambulatory Visit: Payer: Medicare PPO | Admitting: Podiatry

## 2020-12-12 ENCOUNTER — Other Ambulatory Visit: Payer: Self-pay

## 2020-12-12 DIAGNOSIS — M79674 Pain in right toe(s): Secondary | ICD-10-CM

## 2020-12-12 DIAGNOSIS — B351 Tinea unguium: Secondary | ICD-10-CM | POA: Diagnosis not present

## 2020-12-12 DIAGNOSIS — B070 Plantar wart: Secondary | ICD-10-CM

## 2020-12-12 NOTE — Progress Notes (Signed)
   Subjective: 70 y.o. female presenting today for evaluation of a symptomatic lesion to the plantar aspect of the left seventh fifth MTPJ that has been present for few months now.  She states that is very painful to walk on.  She has not done anything for treatment.  She also states that she is developing discoloration to the distal tip of the right great toe.  She is concerned because the toenail is lifting off of the underlying nailbed.  Again she has not done anything for treatment and she presents for further treatment and evaluation   Past Medical History:  Diagnosis Date  . Allergy   . DDD (degenerative disc disease), lumbar   . Esophageal mass 2015   Found incidently on CXR- Saw ENT and for visualization and nothing was there.   Marland Kitchen GERD (gastroesophageal reflux disease)   . Hyperlipidemia   . Osteoporosis     Objective: Physical Exam General: The patient is alert and oriented x3 in no acute distress.   Dermatology: Hyperkeratotic skin lesion(s) noted to the plantar aspect of the left foot approximately 1 cm in diameter. Pinpoint bleeding noted upon debridement. Skin is warm, dry and supple bilateral lower extremities. Negative for open lesions or macerations.  Hyperkeratotic dystrophic nail to the distal tip of the right hallux also noted with thickening consistent with onychomycosis of the distal portion of the nail plate.   Vascular: Palpable pedal pulses bilaterally. No edema or erythema noted. Capillary refill within normal limits.   Neurological: Epicritic and protective threshold grossly intact bilaterally.    Musculoskeletal Exam: Pain on palpation to the noted skin lesion(s).  Range of motion within normal limits to all pedal and ankle joints bilateral. Muscle strength 5/5 in all groups bilateral.    Assessment: #1 plantar wart left foot #2 onychomycosis of toenail right great toe   Plan of Care:  #1 Patient was evaluated. #2 Excisional debridement of the plantar  wart lesion(s) was performed using a chisel blade.  Salicylic acid was applied and the lesion(s) was dressed with a dry sterile dressing.  Recommend salicylic acid daily #3  Today we discussed different treatment options regarding the onychomycosis of the toenail including oral, topical, and laser antifungal treatment modalities.  Patient opts for topical antifungal. #4 Formula3 antifungal topical provided for the patient today and dispensed here in the office #5 return to clinic as needed  Edrick Kins, DPM [facility]  Dr. Edrick Kins, DPM    [address], [place] [address]                Office [phone]  Fax [phone]

## 2020-12-15 ENCOUNTER — Other Ambulatory Visit: Payer: Self-pay

## 2020-12-15 ENCOUNTER — Ambulatory Visit (INDEPENDENT_AMBULATORY_CARE_PROVIDER_SITE_OTHER): Payer: Medicare PPO | Admitting: Family Medicine

## 2020-12-15 ENCOUNTER — Encounter: Payer: Self-pay | Admitting: Family Medicine

## 2020-12-15 VITALS — BP 125/73 | HR 73 | Temp 98.4°F | Ht 63.5 in | Wt 153.0 lb

## 2020-12-15 DIAGNOSIS — E782 Mixed hyperlipidemia: Secondary | ICD-10-CM | POA: Diagnosis not present

## 2020-12-15 DIAGNOSIS — Z13820 Encounter for screening for osteoporosis: Secondary | ICD-10-CM

## 2020-12-15 DIAGNOSIS — Z0000 Encounter for general adult medical examination without abnormal findings: Secondary | ICD-10-CM

## 2020-12-15 DIAGNOSIS — Z1231 Encounter for screening mammogram for malignant neoplasm of breast: Secondary | ICD-10-CM

## 2020-12-15 DIAGNOSIS — Z1211 Encounter for screening for malignant neoplasm of colon: Secondary | ICD-10-CM | POA: Diagnosis not present

## 2020-12-15 DIAGNOSIS — E559 Vitamin D deficiency, unspecified: Secondary | ICD-10-CM

## 2020-12-15 DIAGNOSIS — K219 Gastro-esophageal reflux disease without esophagitis: Secondary | ICD-10-CM | POA: Diagnosis not present

## 2020-12-15 LAB — URINALYSIS, ROUTINE W REFLEX MICROSCOPIC
Bilirubin, UA: NEGATIVE
Glucose, UA: NEGATIVE
Leukocytes,UA: NEGATIVE
Nitrite, UA: NEGATIVE
RBC, UA: NEGATIVE
Specific Gravity, UA: 1.025 (ref 1.005–1.030)
Urobilinogen, Ur: 0.2 mg/dL (ref 0.2–1.0)
pH, UA: 6.5 (ref 5.0–7.5)

## 2020-12-15 MED ORDER — DICLOFENAC SODIUM 1 % EX GEL: 4.0000 g | Freq: Four times a day (QID) | CUTANEOUS | 12 refills | Status: DC

## 2020-12-15 NOTE — Patient Instructions (Signed)
Call to schedule your bone density and mammogram:  [facility]  Address: [address], [place], [place] [address]  Phone: [phone]

## 2020-12-15 NOTE — Progress Notes (Signed)
BP 125/73   Pulse 73   Temp 98.4 F (36.9 C)   Ht 5' 3.5" (1.613 m)   Wt 153 lb (69.4 kg)   SpO2 98%   BMI 26.68 kg/m    Subjective:    Patient ID: Angie Copeland, female    DOB: 1951-01-16, 70 y.o.   MRN: [id number]  HPI: Angie Copeland is a 70 y.o. female presenting on 12/15/2020 for comprehensive medical examination. Current medical complaints include:  HYPERLIPIDEMIA Hyperlipidemia status:  stable Satisfied with current treatment?  yes Side effects:  not on anyting Aspirin:  no The 10-year ASCVD risk score Mikey Bussing DC Jr., et al., 2013) is: 10.2%   Values used to calculate the score:     Age: 64 years     Sex: Female     Is Non-Hispanic African American: No     Diabetic: No     Tobacco smoker: No     Systolic Blood Pressure: 476 mmHg     Is BP treated: No     HDL Cholesterol: 87 mg/dL     Total Cholesterol: 268 mg/dL Chest pain:  no Coronary artery disease:  no  GERD GERD control status: controlled Satisfied with current treatment? yes Heartburn frequency: rarely Medication side effects: not on anything  Dysphagia: no Odynophagia:  no Hematemesis: no Blood in stool: no EGD: no  Menopausal Symptoms: no  Depression Screen done today and results listed below:  Depression screen [facility] 2/9 12/15/2020 03/22/2019 12/23/2018 12/19/2017 12/17/2016  Decreased Interest 0 0 0 0 0  Down, Depressed, Hopeless 0 0 1 0 0  PHQ - 2 Score 0 0 1 0 0  Altered sleeping - 1 - 2 -  Tired, decreased energy - 0 - 0 -  Change in appetite - 0 - 2 -  Feeling bad or failure about yourself  - 0 - 0 -  Trouble concentrating - 0 - 0 -  Moving slowly or fidgety/restless - 0 - 0 -  Suicidal thoughts - 0 - 0 -  PHQ-9 Score - 1 - 4 -  Difficult doing work/chores - Not difficult at all - Not difficult at all -     Past Medical History:  Past Medical History:  Diagnosis Date   Allergy    DDD (degenerative disc disease), lumbar    Esophageal mass 2015   Found incidently on CXR- Saw ENT and for  visualization and nothing was there.    GERD (gastroesophageal reflux disease)    Hyperlipidemia    Osteoporosis     Surgical History:  Past Surgical History:  Procedure Laterality Date   EYE SURGERY     cataract surgery    NOSE SURGERY     Cartilage removed from left side of nose    Medications:  No current outpatient medications on file prior to visit.   No current facility-administered medications on file prior to visit.    Allergies:  Allergies  Allergen Reactions   Penicillins Rash    Social History:  Social History   Socioeconomic History   Marital status: Divorced    Spouse name: Not on file   Number of children: Not on file   Years of education: Not on file   Highest education level: Bachelor's degree (e.g., BA, AB, BS)  Occupational History   Not on file  Tobacco Use   Smoking status: Former    Pack years: 0.00    Types: Cigarettes    Quit date: 07/09/1971  Years since quitting: 49.4   Smokeless tobacco: Never  Vaping Use   Vaping Use: Never used  Substance and Sexual Activity   Alcohol use: Not Currently   Drug use: No   Sexual activity: Never  Other Topics Concern   Not on file  Social History Narrative   Not on file   Social Determinants of Health   Financial Resource Strain: Not on file  Food Insecurity: Not on file  Transportation Needs: Not on file  Physical Activity: Not on file  Stress: Not on file  Social Connections: Not on file  Intimate Partner Violence: Not on file   Social History   Tobacco Use  Smoking Status Former   Pack years: 0.00   Types: Cigarettes   Quit date: 07/09/1971   Years since quitting: 49.4  Smokeless Tobacco Never   Social History   Substance and Sexual Activity  Alcohol Use Not Currently    Family History:  Family History  Problem Relation Age of Onset   Heart disease Mother    Diabetes Mother    Hiatal hernia Mother    Arthritis Mother    Cancer Father 66       Colon   Colitis Sister     Irritable bowel syndrome Sister    Arthritis Sister    Diabetes Brother    Stroke Maternal Grandmother    Pneumonia Maternal Grandmother    Kidney failure Paternal Grandmother    Leukemia Paternal Grandfather    Cancer Paternal Grandfather        Leukemia   Heart disease Sister    Irregular heart beat Sister    Thyroid disease Sister     Past medical history, surgical history, medications, allergies, family history and social history reviewed with patient today and changes made to appropriate areas of the chart.   Review of Systems  Constitutional: Negative.   HENT:  Positive for hearing loss. Negative for congestion, ear discharge, ear pain, nosebleeds, sinus pain, sore throat and tinnitus.   Eyes:  Positive for blurred vision. Negative for double vision, photophobia, pain, discharge and redness.  Respiratory: Negative.  Negative for stridor.   Cardiovascular:  Positive for palpitations. Negative for chest pain, orthopnea, claudication, leg swelling and PND.  Gastrointestinal:  Positive for abdominal pain (with eating popcorn). Negative for blood in stool, constipation, diarrhea, heartburn, melena, nausea and vomiting.  Genitourinary: Negative.   Musculoskeletal:  Positive for myalgias. Negative for back pain, falls, joint pain and neck pain.  Skin: Negative.   Neurological:  Positive for tingling. Negative for dizziness, tremors, sensory change, speech change, focal weakness, seizures, loss of consciousness, weakness and headaches.  Endo/Heme/Allergies: Negative.   Psychiatric/Behavioral: Negative.    All other ROS negative except what is listed above and in the HPI.      Objective:    BP 125/73   Pulse 73   Temp 98.4 F (36.9 C)   Ht 5' 3.5" (1.613 m)   Wt 153 lb (69.4 kg)   SpO2 98%   BMI 26.68 kg/m   Wt Readings from Last 3 Encounters:  12/15/20 153 lb (69.4 kg)  02/14/20 150 lb (68 kg)  03/22/19 149 lb (67.6 kg)    Physical Exam Vitals and nursing note  reviewed.  Constitutional:      General: She is not in acute distress.    Appearance: Normal appearance. She is not ill-appearing, toxic-appearing or diaphoretic.  HENT:     Head: Normocephalic and atraumatic.  Right Ear: Tympanic membrane, ear canal and external ear normal. There is no impacted cerumen.     Left Ear: Tympanic membrane, ear canal and external ear normal. There is no impacted cerumen.     Nose: Nose normal. No congestion or rhinorrhea.     Mouth/Throat:     Mouth: Mucous membranes are moist.     Pharynx: Oropharynx is clear. No oropharyngeal exudate or posterior oropharyngeal erythema.  Eyes:     General: No scleral icterus.       Right eye: No discharge.        Left eye: No discharge.     Extraocular Movements: Extraocular movements intact.     Conjunctiva/sclera: Conjunctivae normal.     Pupils: Pupils are equal, round, and reactive to light.  Neck:     Vascular: No carotid bruit.  Cardiovascular:     Rate and Rhythm: Normal rate and regular rhythm.     Pulses: Normal pulses.     Heart sounds: No murmur heard.   No friction rub. No gallop.  Pulmonary:     Effort: Pulmonary effort is normal. No respiratory distress.     Breath sounds: Normal breath sounds. No stridor. No wheezing, rhonchi or rales.  Chest:     Chest wall: No tenderness.  Abdominal:     General: Abdomen is flat. Bowel sounds are normal. There is no distension.     Palpations: Abdomen is soft. There is no mass.     Tenderness: There is no abdominal tenderness. There is no right CVA tenderness, left CVA tenderness, guarding or rebound.     Hernia: No hernia is present.  Genitourinary:    Comments: Breast and pelvic exams deferred with shared decision making Musculoskeletal:        General: No swelling, tenderness, deformity or signs of injury.     Cervical back: Normal range of motion and neck supple. No rigidity. No muscular tenderness.     Right lower leg: No edema.     Left lower leg: No  edema.  Lymphadenopathy:     Cervical: No cervical adenopathy.  Skin:    General: Skin is warm and dry.     Capillary Refill: Capillary refill takes less than 2 seconds.     Coloration: Skin is not jaundiced or pale.     Findings: No bruising, erythema, lesion or rash.  Neurological:     General: No focal deficit present.     Mental Status: She is alert and oriented to person, place, and time. Mental status is at baseline.     Cranial Nerves: No cranial nerve deficit.     Sensory: No sensory deficit.     Motor: No weakness.     Coordination: Coordination normal.     Gait: Gait normal.     Deep Tendon Reflexes: Reflexes normal.  Psychiatric:        Mood and Affect: Mood normal.        Behavior: Behavior normal.        Thought Content: Thought content normal.        Judgment: Judgment normal.    Results for orders placed or performed in visit on 03/22/19  CBC with Differential/Platelet  Result Value Ref Range   WBC 6.3 3.4 - 10.8 x10E3/uL   RBC 4.77 3.77 - 5.28 x10E6/uL   Hemoglobin 14.7 11.1 - 15.9 g/dL   Hematocrit 43.6 34.0 - 46.6 %   MCV 91 79 - 97 fL   MCH 30.8 26.6 -  33.0 pg   MCHC 33.7 31.5 - 35.7 g/dL   RDW 12.1 11.7 - 15.4 %   Platelets 184 150 - 450 x10E3/uL   Neutrophils 55 Not Estab. %   Lymphs 35 Not Estab. %   Monocytes 8 Not Estab. %   Eos 1 Not Estab. %   Basos 1 Not Estab. %   Neutrophils Absolute 3.5 1.4 - 7.0 x10E3/uL   Lymphocytes Absolute 2.2 0.7 - 3.1 x10E3/uL   Monocytes Absolute 0.5 0.1 - 0.9 x10E3/uL   EOS (ABSOLUTE) 0.0 0.0 - 0.4 x10E3/uL   Basophils Absolute 0.1 0.0 - 0.2 x10E3/uL   Immature Granulocytes 0 Not Estab. %   Immature Grans (Abs) 0.0 0.0 - 0.1 x10E3/uL  Comprehensive metabolic panel  Result Value Ref Range   Glucose 80 65 - 99 mg/dL   BUN 13 8 - 27 mg/dL   Creatinine, Ser 0.84 0.57 - 1.00 mg/dL   GFR calc non Af Amer 71 >59 mL/min/1.73   GFR calc Af Amer 82 >59 mL/min/1.73   BUN/Creatinine Ratio 15 12 - 28   Sodium 141 134 -  144 mmol/L   Potassium 4.6 3.5 - 5.2 mmol/L   Chloride 102 96 - 106 mmol/L   CO2 22 20 - 29 mmol/L   Calcium 10.1 8.7 - 10.3 mg/dL   Total Protein 7.0 6.0 - 8.5 g/dL   Albumin 4.8 3.8 - 4.8 g/dL   Globulin, Total 2.2 1.5 - 4.5 g/dL   Albumin/Globulin Ratio 2.2 1.2 - 2.2   Bilirubin Total 0.3 0.0 - 1.2 mg/dL   Alkaline Phosphatase 54 39 - 117 IU/L   AST 24 0 - 40 IU/L   ALT 15 0 - 32 IU/L  Lipid Panel w/o Chol/HDL Ratio  Result Value Ref Range   Cholesterol, Total 268 (H) 100 - 199 mg/dL   Triglycerides 76 0 - 149 mg/dL   HDL 87 >39 mg/dL   VLDL Cholesterol Cal 12 5 - 40 mg/dL   LDL Chol Calc (NIH) 169 (H) 0 - 99 mg/dL  TSH  Result Value Ref Range   TSH 2.270 0.450 - 4.500 uIU/mL  UA/M w/rflx Culture, Routine   Specimen: Urine   URINE  Result Value Ref Range   Specific Gravity, UA <1.005 (L) 1.005 - 1.030   pH, UA 5.0 5.0 - 7.5   Color, UA Yellow Yellow   Appearance Ur Clear Clear   Leukocytes,UA Negative Negative   Protein,UA Negative Negative/Trace   Glucose, UA Negative Negative   Ketones, UA Trace (A) Negative   RBC, UA Negative Negative   Bilirubin, UA Negative Negative   Urobilinogen, Ur 0.2 0.2 - 1.0 mg/dL   Nitrite, UA Negative Negative  VITAMIN D 25 Hydroxy (Vit-D Deficiency, Fractures)  Result Value Ref Range   Vit D, 25-Hydroxy 18.3 (L) 30.0 - 100.0 ng/mL  Hepatitis C Antibody  Result Value Ref Range   Hep C Virus Ab <0.1 0.0 - 0.9 s/co ratio      Assessment & Plan:   Problem List Items Addressed This Visit       Digestive   GE reflux    Stable off medicine. Call with any concerns.        Relevant Orders   CBC with Differential/Platelet   Urinalysis, Routine w reflex microscopic   TSH     Other   Hyperlipidemia, unspecified    Rechecking labs today. Await results. Treat as needed.        Relevant Orders  Comprehensive metabolic panel   Lipid Panel w/o Chol/HDL Ratio   Urinalysis, Routine w reflex microscopic   TSH   Vitamin D  deficiency    Rechecking labs today. Await results. Treat as needed.        Relevant Orders   Urinalysis, Routine w reflex microscopic   TSH   VITAMIN D 25 Hydroxy (Vit-D Deficiency, Fractures)   Other Visit Diagnoses     Routine general medical examination at a health care facility    -  Primary   Vaccines up to date. Screening labs checked today. Colonoscopy, mammogram and DEXA ordered today. Call with any concerns. Continue to monitor.    Encounter for screening mammogram for malignant neoplasm of breast       Mammogram ordered today.    Relevant Orders   MM DIAG BREAST TOMO BILATERAL   Screening for colon cancer       Referral to GI placed today.   Relevant Orders   Ambulatory referral to Gastroenterology   Screening for osteoporosis       DEXA ordered today.   Relevant Orders   DG Bone Density        Follow up plan: Return in about 1 year (around 12/15/2021) for physical.   LABORATORY TESTING:  - Pap smear: not applicable  IMMUNIZATIONS:   - Tdap: Tetanus vaccination status reviewed: last tetanus booster within 10 years. - Influenza: Postponed to flu season - Pneumovax: Up to date - Prevnar: Up to date - COVID: Up to date - Zostavax vaccine: Given elsewhere  SCREENING: -Mammogram: Ordered today  - Colonoscopy: Ordered today  - Bone Density: Ordered today   PATIENT COUNSELING:   Advised to take 1 mg of folate supplement per day if capable of pregnancy.   Sexuality: Discussed sexually transmitted diseases, partner selection, use of condoms, avoidance of unintended pregnancy  and contraceptive alternatives.   Advised to avoid cigarette smoking.  I discussed with the patient that most people either abstain from alcohol or drink within safe limits (<=14/week and <=4 drinks/occasion for males, <=7/weeks and <= 3 drinks/occasion for females) and that the risk for alcohol disorders and other health effects rises proportionally with the number of drinks per week  and how often a drinker exceeds daily limits.  Discussed cessation/primary prevention of drug use and availability of treatment for abuse.   Diet: Encouraged to adjust caloric intake to maintain  or achieve ideal body weight, to reduce intake of dietary saturated fat and total fat, to limit sodium intake by avoiding high sodium foods and not adding table salt, and to maintain adequate dietary potassium and calcium preferably from fresh fruits, vegetables, and low-fat dairy products.    stressed the importance of regular exercise  Injury prevention: Discussed safety belts, safety helmets, smoke detector, smoking near bedding or upholstery.   Dental health: Discussed importance of regular tooth brushing, flossing, and dental visits.    NEXT PREVENTATIVE PHYSICAL DUE IN 1 YEAR. Return in about 1 year (around 12/15/2021) for physical.

## 2020-12-15 NOTE — Assessment & Plan Note (Signed)
Rechecking labs today. Await results. Treat as needed.  °

## 2020-12-15 NOTE — Assessment & Plan Note (Signed)
Stable off medicine. Call with any concerns.

## 2020-12-16 LAB — CBC WITH DIFFERENTIAL/PLATELET
Basophils Absolute: 0.1 10*3/uL (ref 0.0–0.2)
Basos: 1 %
EOS (ABSOLUTE): 0.1 10*3/uL (ref 0.0–0.4)
Eos: 1 %
Hematocrit: 43.5 % (ref 34.0–46.6)
Hemoglobin: 14.4 g/dL (ref 11.1–15.9)
Immature Grans (Abs): 0 10*3/uL (ref 0.0–0.1)
Immature Granulocytes: 0 %
Lymphocytes Absolute: 2.3 10*3/uL (ref 0.7–3.1)
Lymphs: 32 %
MCH: 30.5 pg (ref 26.6–33.0)
MCHC: 33.1 g/dL (ref 31.5–35.7)
MCV: 92 fL (ref 79–97)
Monocytes Absolute: 0.5 10*3/uL (ref 0.1–0.9)
Monocytes: 7 %
Neutrophils Absolute: 4.1 10*3/uL (ref 1.4–7.0)
Neutrophils: 59 %
Platelets: 249 10*3/uL (ref 150–450)
RBC: 4.72 x10E6/uL (ref 3.77–5.28)
RDW: 11.8 % (ref 11.7–15.4)
WBC: 7 10*3/uL (ref 3.4–10.8)

## 2020-12-16 LAB — COMPREHENSIVE METABOLIC PANEL
ALT: 15 IU/L (ref 0–32)
AST: 22 IU/L (ref 0–40)
Albumin/Globulin Ratio: 2.1 (ref 1.2–2.2)
Albumin: 4.9 g/dL — ABNORMAL HIGH (ref 3.7–4.7)
Alkaline Phosphatase: 64 IU/L (ref 44–121)
BUN/Creatinine Ratio: 15 (ref 12–28)
BUN: 13 mg/dL (ref 8–27)
Bilirubin Total: 0.4 mg/dL (ref 0.0–1.2)
CO2: 22 mmol/L (ref 20–29)
Calcium: 9.5 mg/dL (ref 8.7–10.3)
Chloride: 103 mmol/L (ref 96–106)
Creatinine, Ser: 0.89 mg/dL (ref 0.57–1.00)
Globulin, Total: 2.3 g/dL (ref 1.5–4.5)
Glucose: 74 mg/dL (ref 65–99)
Potassium: 4.2 mmol/L (ref 3.5–5.2)
Sodium: 141 mmol/L (ref 134–144)
Total Protein: 7.2 g/dL (ref 6.0–8.5)
eGFR: 69 mL/min/{1.73_m2} (ref >59)

## 2020-12-16 LAB — LIPID PANEL W/O CHOL/HDL RATIO
Cholesterol, Total: 308 mg/dL — ABNORMAL HIGH (ref 100–199)
HDL: 97 mg/dL (ref >39)
LDL Chol Calc (NIH): 190 mg/dL — ABNORMAL HIGH (ref 0–99)
Triglycerides: 121 mg/dL (ref 0–149)
VLDL Cholesterol Cal: 21 mg/dL (ref 5–40)

## 2020-12-16 LAB — VITAMIN D 25 HYDROXY (VIT D DEFICIENCY, FRACTURES): Vit D, 25-Hydroxy: 28.1 ng/mL — ABNORMAL LOW (ref 30.0–100.0)

## 2020-12-16 LAB — TSH: TSH: 1.94 u[IU]/mL (ref 0.450–4.500)

## 2020-12-18 ENCOUNTER — Other Ambulatory Visit: Payer: Self-pay | Admitting: Family Medicine

## 2020-12-18 DIAGNOSIS — Z1231 Encounter for screening mammogram for malignant neoplasm of breast: Secondary | ICD-10-CM

## 2020-12-22 ENCOUNTER — Encounter: Payer: Self-pay | Admitting: Family Medicine

## 2020-12-26 ENCOUNTER — Ambulatory Visit
Admission: RE | Admit: 2020-12-26 | Discharge: 2020-12-26 | Disposition: A | Discharge status: Home / Self Care | Payer: Medicare PPO | Source: Ambulatory Visit | Attending: Family Medicine | Admitting: Family Medicine

## 2020-12-26 ENCOUNTER — Other Ambulatory Visit: Payer: Self-pay

## 2020-12-26 DIAGNOSIS — M810 Age-related osteoporosis without current pathological fracture: Secondary | ICD-10-CM | POA: Diagnosis not present

## 2020-12-26 DIAGNOSIS — M069 Rheumatoid arthritis, unspecified: Secondary | ICD-10-CM | POA: Diagnosis not present

## 2020-12-26 DIAGNOSIS — Z780 Asymptomatic menopausal state: Secondary | ICD-10-CM | POA: Insufficient documentation

## 2020-12-26 DIAGNOSIS — Z13820 Encounter for screening for osteoporosis: Secondary | ICD-10-CM | POA: Diagnosis not present

## 2020-12-26 DIAGNOSIS — Z1231 Encounter for screening mammogram for malignant neoplasm of breast: Secondary | ICD-10-CM | POA: Insufficient documentation

## 2020-12-27 ENCOUNTER — Other Ambulatory Visit: Payer: Self-pay | Admitting: *Deleted

## 2020-12-27 ENCOUNTER — Inpatient Hospital Stay
Admission: RE | Admit: 2020-12-27 | Discharge: 2020-12-27 | Disposition: A | Discharge status: Home / Self Care | Payer: Self-pay | Source: Ambulatory Visit | Attending: *Deleted | Admitting: *Deleted

## 2020-12-27 DIAGNOSIS — Z1231 Encounter for screening mammogram for malignant neoplasm of breast: Secondary | ICD-10-CM

## 2021-01-01 ENCOUNTER — Encounter: Payer: Self-pay | Admitting: Family Medicine

## 2021-01-01 ENCOUNTER — Other Ambulatory Visit: Payer: Self-pay | Admitting: Family Medicine

## 2021-01-01 MED ORDER — ALENDRONATE SODIUM 70 MG PO TABS: 70.0000 mg | ORAL_TABLET | ORAL | 11 refills | Status: DC

## 2021-01-02 ENCOUNTER — Telehealth: Payer: Self-pay | Admitting: Family Medicine

## 2021-01-02 NOTE — Telephone Encounter (Signed)
Patient states she would like to wait to take alendronate (FOSAMAX) 70 MG tablet until after colonoscopy, awaiting to schedule.   Patient wanted to inform PCP she started a no grain diet which is high in vegetables and greens, patient started diet after bone density test. Patient is now down 1 cup of coffee.

## 2021-01-02 NOTE — Telephone Encounter (Signed)
Please advise 

## 2021-01-02 NOTE — Telephone Encounter (Signed)
ok 

## 2021-01-16 ENCOUNTER — Telehealth: Payer: Self-pay

## 2021-01-16 NOTE — Telephone Encounter (Signed)
Patient left a voicemail to see if her appointment on 01/17/2021 was a telephone visit. Tried to call patient back to inform her it was a telephone visit and voicemail was not set up

## 2021-01-17 ENCOUNTER — Telehealth (INDEPENDENT_AMBULATORY_CARE_PROVIDER_SITE_OTHER): Payer: Self-pay | Admitting: Gastroenterology

## 2021-01-17 DIAGNOSIS — Z1211 Encounter for screening for malignant neoplasm of colon: Secondary | ICD-10-CM

## 2021-01-17 DIAGNOSIS — Z800 Family history of malignant neoplasm of digestive organs: Secondary | ICD-10-CM

## 2021-01-17 MED ORDER — NA SULFATE-K SULFATE-MG SULF 17.5-3.13-1.6 GM/177ML PO SOLN: 1.0000 | Freq: Once | ORAL | 0 refills | Status: AC

## 2021-01-17 NOTE — Progress Notes (Signed)
Gastroenterology Pre-Procedure Review  Request Date: 02/06/21 Requesting Physician: Dr. Bonna Gains  PATIENT REVIEW QUESTIONS: The patient responded to the following health history questions as indicated:    1. Are you having any GI issues?  Sometimes when she eat certain foods. (Bloating) 2. Do you have a personal history of Polyps?  07/04/2008 no polyps removed. 3. Do you have a family history of Colon Cancer or Polyps? yes (father retcal cancer) 4. Diabetes Mellitus? no 5. Joint replacements in the past 12 months?no 6. Major health problems in the past 3 months?no 7. Any artificial heart valves, MVP, or defibrillator?no    MEDICATIONS & ALLERGIES:    Patient reports the following regarding taking any anticoagulation/antiplatelet therapy:   Plavix, Coumadin, Eliquis, Xarelto, Lovenox, Pradaxa, Brilinta, or Effient? no Aspirin? no  Patient confirms/reports the following medications:  Current Outpatient Medications  Medication Sig Dispense Refill   alendronate (FOSAMAX) 70 MG tablet Take 1 tablet (70 mg total) by mouth every 7 (seven) days. Take with a full glass of water on an empty stomach. 4 tablet 11   diclofenac Sodium (VOLTAREN) 1 % GEL Apply 4 g topically 4 (four) times daily. 100 g 12   No current facility-administered medications for this visit.    Patient confirms/reports the following allergies:  Allergies  Allergen Reactions   Penicillins Rash    No orders of the defined types were placed in this encounter.   AUTHORIZATION INFORMATION Primary Insurance: 1D#: Group #:  Secondary Insurance: 1D#: Group #:  SCHEDULE INFORMATION: Date: 02/06/21 Time: Location: [place]

## 2021-01-31 ENCOUNTER — Telehealth: Payer: Self-pay

## 2021-01-31 NOTE — Telephone Encounter (Signed)
Copied from [place] [id number]. Topic: General - Other >> Jan 31, 2021  1:14 PM Alanda Slim E wrote: Reason for CRM: FYI/ Pt wanted to let Dr. Wynetta Emery know that she has scheduled her colonoscopy for August 2nd / Pt also wanted to let her know that she took the 2nd covid booster with [facility] on July 15th /

## 2021-02-05 ENCOUNTER — Encounter: Payer: Self-pay | Admitting: Gastroenterology

## 2021-02-06 ENCOUNTER — Encounter: Payer: Self-pay | Admitting: Gastroenterology

## 2021-02-06 ENCOUNTER — Ambulatory Visit: Payer: Medicare PPO | Admitting: Certified Registered Nurse Anesthetist

## 2021-02-06 ENCOUNTER — Encounter
Admission: RE | Disposition: A | Discharge status: Home / Self Care | Payer: Self-pay | Source: Home / Self Care | Attending: Gastroenterology

## 2021-02-06 ENCOUNTER — Ambulatory Visit
Admission: RE | Admit: 2021-02-06 | Discharge: 2021-02-06 | Disposition: A | Discharge status: Home / Self Care | Payer: Medicare PPO | Attending: Gastroenterology | Admitting: Gastroenterology

## 2021-02-06 DIAGNOSIS — Z880 Allergy status to penicillin: Secondary | ICD-10-CM | POA: Insufficient documentation

## 2021-02-06 DIAGNOSIS — K649 Unspecified hemorrhoids: Secondary | ICD-10-CM | POA: Diagnosis not present

## 2021-02-06 DIAGNOSIS — K6289 Other specified diseases of anus and rectum: Secondary | ICD-10-CM | POA: Insufficient documentation

## 2021-02-06 DIAGNOSIS — Z87891 Personal history of nicotine dependence: Secondary | ICD-10-CM | POA: Diagnosis not present

## 2021-02-06 DIAGNOSIS — D122 Benign neoplasm of ascending colon: Secondary | ICD-10-CM | POA: Insufficient documentation

## 2021-02-06 DIAGNOSIS — D120 Benign neoplasm of cecum: Secondary | ICD-10-CM | POA: Diagnosis not present

## 2021-02-06 DIAGNOSIS — K648 Other hemorrhoids: Secondary | ICD-10-CM | POA: Diagnosis not present

## 2021-02-06 DIAGNOSIS — Z800 Family history of malignant neoplasm of digestive organs: Secondary | ICD-10-CM | POA: Diagnosis not present

## 2021-02-06 DIAGNOSIS — K635 Polyp of colon: Secondary | ICD-10-CM | POA: Diagnosis not present

## 2021-02-06 DIAGNOSIS — Z7983 Long term (current) use of bisphosphonates: Secondary | ICD-10-CM | POA: Insufficient documentation

## 2021-02-06 DIAGNOSIS — K5730 Diverticulosis of large intestine without perforation or abscess without bleeding: Secondary | ICD-10-CM | POA: Insufficient documentation

## 2021-02-06 DIAGNOSIS — Z1211 Encounter for screening for malignant neoplasm of colon: Secondary | ICD-10-CM | POA: Diagnosis not present

## 2021-02-06 HISTORY — PX: COLONOSCOPY WITH PROPOFOL: SHX5780

## 2021-02-06 SURGERY — COLONOSCOPY WITH PROPOFOL
Anesthesia: General

## 2021-02-06 MED ORDER — SODIUM CHLORIDE 0.9 % IV SOLN
INTRAVENOUS | Status: DC
Start: 2021-02-06 — End: 2021-02-06

## 2021-02-06 MED ORDER — PROPOFOL 500 MG/50ML IV EMUL
INTRAVENOUS | Status: DC | PRN
  Administered 2021-02-06: 150 ug/kg/min via INTRAVENOUS

## 2021-02-06 MED ORDER — LIDOCAINE HCL (CARDIAC) PF 100 MG/5ML IV SOSY
PREFILLED_SYRINGE | INTRAVENOUS | Status: DC | PRN
  Administered 2021-02-06: 50 mg via INTRAVENOUS

## 2021-02-06 MED ORDER — PROPOFOL 10 MG/ML IV BOLUS
INTRAVENOUS | Status: DC | PRN
  Administered 2021-02-06: 60 mg via INTRAVENOUS

## 2021-02-06 NOTE — H&P (Signed)
Vonda Antigua, MD [address] [place], [place], [place], [id number] [id number], [place], [place], [place], [address] Phone: [phone]  Fax: [phone]  Primary Care Physician:  Valerie Roys, DO   Pre-Procedure History & Physical: HPI:  Angie Copeland is a 70 y.o. female is here for a colonoscopy.   Past Medical History:  Diagnosis Date   Allergy    DDD (degenerative disc disease), lumbar    Esophageal mass 2015   Found incidently on CXR- Saw ENT and for visualization and nothing was there.    GERD (gastroesophageal reflux disease)    Hyperlipidemia    Osteoporosis     Past Surgical History:  Procedure Laterality Date   EYE SURGERY     cataract surgery    NOSE SURGERY     Cartilage removed from left side of nose    Prior to Admission medications   Medication Sig Start Date End Date Taking? Authorizing Provider  alendronate (FOSAMAX) 70 MG tablet Take 1 tablet (70 mg total) by mouth every 7 (seven) days. Take with a full glass of water on an empty stomach. 01/01/21   Park Liter P, DO  diclofenac Sodium (VOLTAREN) 1 % GEL Apply 4 g topically 4 (four) times daily. 12/15/20   Park Liter P, DO    Allergies as of 01/17/2021 - Review Complete 01/17/2021  Allergen Reaction Noted   Penicillins Rash     Family History  Problem Relation Age of Onset   Heart disease Mother    Diabetes Mother    Hiatal hernia Mother    Arthritis Mother    Cancer Father 68       Colon   Colitis Sister    Irritable bowel syndrome Sister    Arthritis Sister    Diabetes Brother    Stroke Maternal Grandmother    Pneumonia Maternal Grandmother    Kidney failure Paternal Grandmother    Leukemia Paternal Grandfather    Cancer Paternal Grandfather        Leukemia   Heart disease Sister    Irregular heart beat Sister    Thyroid disease Sister     Social History   Socioeconomic History   Marital status: Divorced    Spouse name: Not on file   Number of children:  Not on file   Years of education: Not on file   Highest education level: Bachelor's degree (e.g., BA, AB, BS)  Occupational History   Not on file  Tobacco Use   Smoking status: Former    Types: Cigarettes    Quit date: 07/09/1971    Years since quitting: 49.6   Smokeless tobacco: Never  Vaping Use   Vaping Use: Never used  Substance and Sexual Activity   Alcohol use: Yes    Comment: wine   Drug use: No   Sexual activity: Never  Other Topics Concern   Not on file  Social History Narrative   Not on file   Social Determinants of Health   Financial Resource Strain: Not on file  Food Insecurity: Not on file  Transportation Needs: Not on file  Physical Activity: Not on file  Stress: Not on file  Social Connections: Not on file  Intimate Partner Violence: Not on file    Review of Systems: See HPI, otherwise negative ROS  Physical Exam: Constitutional: General:   Alert,  Well-developed, well-nourished, pleasant and cooperative in NAD BP 123/68   Pulse 84   Temp 98.5 F (36.9 C) (Temporal)  Resp 16   Ht 5' 3.5" (1.613 m)   Wt 67.1 kg   SpO2 99%   BMI 25.81 kg/m   Head: Normocephalic, atraumatic.   Eyes:  Sclera clear, no icterus.   Conjunctiva pink.   Mouth:  No deformity or lesions, oropharynx pink & moist.  Neck:  Supple, trachea midline  Respiratory: Normal respiratory effort  Gastrointestinal:  Soft, non-tender and non-distended without masses, hepatosplenomegaly or hernias noted.  No guarding or rebound tenderness.     Cardiac: No clubbing or edema.  No cyanosis. Normal posterior tibial pedal pulses noted.  Lymphatic:  No significant cervical adenopathy.  Psych:  Alert and cooperative. Normal mood and affect.  Musculoskeletal:   Symmetrical without gross deformities. 5/5 Lower extremity strength bilaterally.  Skin: Warm. Intact without significant lesions or rashes. No jaundice.  Neurologic:  Face symmetrical, tongue midline, Normal sensation to  touch;  grossly normal neurologically.  Psych:  Alert and oriented x3, Alert and cooperative. Normal mood and affect.  Impression/Plan: Angie Copeland is here for a colonoscopy to be performed for family history of colon cancer in father. Previous colonoscopies in 2004 and 2009 (see provation) showed diverticulosis but no polyps were reported.   Risks, benefits, limitations, and alternatives regarding  colonoscopy have been reviewed with the patient.  Questions have been answered.  All parties agreeable.   Virgel Manifold, MD  02/06/2021, 9:48 AM

## 2021-02-06 NOTE — Op Note (Signed)
[facility] Gastroenterology Patient Name: Angie Copeland Procedure Date: 02/06/2021 9:57 AM MRN: [id number] Account #: [id number] Date of Birth: [birth year] Admit Type: Outpatient Age: 70 Room: [facility] ENDO ROOM 3 Gender: Female Note Status: Finalized Procedure:             Colonoscopy Indications:           Screening in patient at increased risk: Family history                         of 1st-degree relative with colorectal cancer Providers:             Tylynn Braniff B. Bonna Gains MD, MD Medicines:             Monitored Anesthesia Care Complications:         No immediate complications. Procedure:             Pre-Anesthesia Assessment:                        - ASA Grade Assessment: II - A patient with mild                         systemic disease.                        - Prior to the procedure, a History and Physical was                         performed, and patient medications, allergies and                         sensitivities were reviewed. The patient's tolerance                         of previous anesthesia was reviewed.                        - The risks and benefits of the procedure and the                         sedation options and risks were discussed with the                         patient. All questions were answered and informed                         consent was obtained.                        - Patient identification and proposed procedure were                         verified prior to the procedure by the physician, the                         nurse, the anesthesiologist, the anesthetist and the                         technician. The procedure was verified in the  procedure room.                        After obtaining informed consent, the colonoscope was                         passed under direct vision. Throughout the procedure,                         the patient's blood pressure, pulse, and oxygen                          saturations were monitored continuously. The                         Colonoscope was introduced through the anus and                         advanced to the the cecum, identified by appendiceal                         orifice and ileocecal valve. The colonoscopy was                         performed with ease. The patient tolerated the                         procedure well. The quality of the bowel preparation                         was good. Findings:      The perianal and digital rectal examinations were normal.      A 3 mm polyp was found in the cecum. The polyp was sessile. The polyp       was removed with a cold snare. Resection and retrieval were complete.      A 8 mm polyp was found in the ascending colon. The polyp was sessile.       The polyp was removed with a cold snare. Resection and retrieval were       complete.      Two diverticula were found in the sigmoid colon and ascending colon.      The exam was otherwise without abnormality.      The rectum, sigmoid colon, descending colon, transverse colon, ascending       colon and cecum appeared normal.      Non-bleeding internal hemorrhoids were found during retroflexion.      Anal papilla(e) were hypertrophied.      No additional abnormalities were found on retroflexion. Impression:            - One 3 mm polyp in the cecum, removed with a cold                         snare. Resected and retrieved.                        - One 8 mm polyp in the ascending colon, removed with                         a cold snare.  Resected and retrieved.                        - Diverticulosis in the sigmoid colon and in the                         ascending colon.                        - The examination was otherwise normal.                        - The rectum, sigmoid colon, descending colon,                         transverse colon, ascending colon and cecum are normal.                        - Non-bleeding internal hemorrhoids.                         - Anal papilla(e) were hypertrophied. Recommendation:        - Discharge patient to home (with escort).                        - Advance diet as tolerated.                        - Continue present medications.                        - Await pathology results.                        - Repeat colonoscopy date to be determined after                         pending pathology results are reviewed.                        - The findings and recommendations were discussed with                         the patient.                        - The findings and recommendations were discussed with                         the patient's family.                        - Return to primary care physician as previously                         scheduled.                        - High fiber diet. Procedure Code(s):     --- Professional ---                        [phone], Colonoscopy, flexible; with removal  of                         tumor(s), polyp(s), or other lesion(s) by snare                         technique Diagnosis Code(s):     --- Professional ---                        Z80.0, Family history of malignant neoplasm of                         digestive organs                        K63.5, Polyp of colon CPT copyright 2019 American Medical Association. All rights reserved. The codes documented in this report are preliminary and upon coder review may  be revised to meet current compliance requirements.  Vonda Antigua, MD Margretta Sidle B. Bonna Gains MD, MD 02/06/2021 10:37:25 AM This report has been signed electronically. Number of Addenda: 0 Note Initiated On: 02/06/2021 9:57 AM Scope Withdrawal Time: 0 hours 19 minutes 58 seconds  Total Procedure Duration: 0 hours 25 minutes 0 seconds  Estimated Blood Loss:  Estimated blood loss: none.      [facility]

## 2021-02-06 NOTE — Transfer of Care (Signed)
Immediate Anesthesia Transfer of Care Note  Patient: Angie Copeland [facility]  Procedure(s) Performed: COLONOSCOPY WITH PROPOFOL  Patient Location: Endoscopy Unit  Anesthesia Type:General  Level of Consciousness: drowsy  Airway & Oxygen Therapy: Patient Spontanous Breathing  Post-op Assessment: Report given to RN and Post -op Vital signs reviewed and stable  Post vital signs: Reviewed and stable  Last Vitals:  Vitals Value Taken Time  BP 125/59 02/06/21 1027  Temp    Pulse 71 02/06/21 1028  Resp 13 02/06/21 1028  SpO2 100 % 02/06/21 1028  Vitals shown include unvalidated device data.  Last Pain:  Vitals:   02/06/21 0920  TempSrc: Temporal  PainSc: 0-No pain         Complications: No notable events documented.

## 2021-02-06 NOTE — Anesthesia Preprocedure Evaluation (Signed)
Anesthesia Evaluation  Patient identified by MRN, date of birth, ID band Patient awake    Reviewed: Allergy & Precautions, H&P , NPO status , Patient's Chart, lab work & pertinent test results, reviewed documented beta blocker date and time   History of Anesthesia Complications Negative for: history of anesthetic complications  Airway Mallampati: I  TM Distance: >3 FB Neck ROM: full    Dental  (+) Dental Advidsory Given, Teeth Intact, Caps Permanent bridge x2 - top left and right:   Pulmonary neg pulmonary ROS, former smoker,    Pulmonary exam normal breath sounds clear to auscultation       Cardiovascular Exercise Tolerance: Good negative cardio ROS Normal cardiovascular exam Rhythm:regular Rate:Normal     Neuro/Psych PSYCHIATRIC DISORDERS Anxiety negative neurological ROS     GI/Hepatic Neg liver ROS, GERD  ,  Endo/Other  negative endocrine ROS  Renal/GU negative Renal ROS  negative genitourinary   Musculoskeletal   Abdominal   Peds  Hematology negative hematology ROS (+)   Anesthesia Other Findings Past Medical History: No date: Allergy No date: DDD (degenerative disc disease), lumbar 2015: Esophageal mass     Comment:  Found incidently on CXR- Saw ENT and for visualization               and nothing was there.  No date: GERD (gastroesophageal reflux disease) No date: Hyperlipidemia No date: Osteoporosis   Reproductive/Obstetrics negative OB ROS                             Anesthesia Physical Anesthesia Plan  ASA: 2  Anesthesia Plan: General   Post-op Pain Management:    Induction: Intravenous  PONV Risk Score and Plan: 3 and TIVA and Propofol infusion  Airway Management Planned: Natural Airway and Nasal Cannula  Additional Equipment:   Intra-op Plan:   Post-operative Plan:   Informed Consent: I have reviewed the patients History and Physical, chart, labs and  discussed the procedure including the risks, benefits and alternatives for the proposed anesthesia with the patient or authorized representative who has indicated his/her understanding and acceptance.     Dental Advisory Given  Plan Discussed with: Anesthesiologist, CRNA and Surgeon  Anesthesia Plan Comments:         Anesthesia Quick Evaluation

## 2021-02-07 ENCOUNTER — Encounter: Payer: Self-pay | Admitting: Gastroenterology

## 2021-02-07 LAB — SURGICAL PATHOLOGY

## 2021-02-07 NOTE — Anesthesia Postprocedure Evaluation (Signed)
Anesthesia Post Note  Patient: Angie Copeland [facility]  Procedure(s) Performed: COLONOSCOPY WITH PROPOFOL  Patient location during evaluation: Endoscopy Anesthesia Type: General Level of consciousness: awake and alert Pain management: pain level controlled Vital Signs Assessment: post-procedure vital signs reviewed and stable Respiratory status: spontaneous breathing, nonlabored ventilation, respiratory function stable and patient connected to nasal cannula oxygen Cardiovascular status: blood pressure returned to baseline and stable Postop Assessment: no apparent nausea or vomiting Anesthetic complications: no   No notable events documented.   Last Vitals:  Vitals:   02/06/21 1048 02/06/21 1058  BP: 120/71 134/90  Pulse: 70 62  Resp: 18 13  Temp:    SpO2: 100% 100%    Last Pain:  Vitals:   02/07/21 0735  TempSrc:   PainSc: 0-No pain                 Martha Clan

## 2021-02-15 ENCOUNTER — Encounter: Payer: Self-pay | Admitting: Gastroenterology

## 2021-03-29 ENCOUNTER — Ambulatory Visit: Payer: Self-pay | Admitting: *Deleted

## 2021-03-29 NOTE — Telephone Encounter (Signed)
Answered questions regarding flu vaccine and the latest Covid Boosters. [place] is [place] in 2 [place]-discussed a plan to have both vaccines ahead of time.    Reason for Disposition  Health Information question, no triage required and triager able to answer question  Answer Assessment - Initial Assessment Questions 1. REASON FOR CALL or QUESTION: "What is your reason for calling today?" or "How can I best help you?" or "What question do you have that I can help answer?"     Questions regarding flu vaccine and latest Covid Booster.  Protocols used: Information Only Call - No Triage-A-AH

## 2021-05-08 ENCOUNTER — Telehealth: Payer: Self-pay | Admitting: Family Medicine

## 2021-05-08 NOTE — Telephone Encounter (Signed)
Copied from [place] [id number]. Topic: Medicare AWV >> May 08, 2021  9:58 AM Lavonia Drafts wrote: Reason for CRM:  Unable to reach patient, no answer and VM not setup. Calling to reschedule 05/18/21 appointment for AWV with [facility], due to change in [facility] schedule.   If patient calls back please reschedule this appointment.

## 2021-05-17 ENCOUNTER — Ambulatory Visit (INDEPENDENT_AMBULATORY_CARE_PROVIDER_SITE_OTHER): Payer: Medicare PPO | Admitting: Nurse Practitioner

## 2021-05-17 DIAGNOSIS — Z0000 Encounter for general adult medical examination without abnormal findings: Secondary | ICD-10-CM

## 2021-05-17 NOTE — Progress Notes (Signed)
Subjective:   Angie Copeland is a 70 y.o. female who presents for Medicare Annual (Subsequent) preventive examination.  I connected with  Angie Copeland on 05/17/21 by a video enabled telemedicine application and verified that I am speaking with the correct person using two identifiers.   I discussed the limitations of evaluation and management by telemedicine. The patient expressed understanding and agreed to proceed.    Review of Systems     Cardiac Risk Factors include: advanced age (>57men, >3 women)     Objective:    Today's Vitals   There is no height or weight on file to calculate BMI.  Advanced Directives 05/17/2021 02/06/2021 12/23/2018 12/19/2017 12/17/2016  Does Patient Have a Medical Advance Directive? Yes Yes No Yes Yes  Type of Advance Directive [place];Living will  Does patient want to make changes to medical advance directive? No - Patient declined - - - No - Patient declined  Copy of [place] in Chart? No - copy requested - - No - copy requested No - copy requested    Current Medications (verified) Outpatient Encounter Medications as of 05/17/2021  Medication Sig   diclofenac Sodium (VOLTAREN) 1 % GEL Apply 4 g topically 4 (four) times daily.   alendronate (FOSAMAX) 70 MG tablet Take 1 tablet (70 mg total) by mouth every 7 (seven) days. Take with a full glass of water on an empty stomach. (Patient not taking: Reported on 05/17/2021)   No facility-administered encounter medications on file as of 05/17/2021.    Allergies (verified) Penicillins   History: Past Medical History:  Diagnosis Date   Allergy    DDD (degenerative disc disease), lumbar    Esophageal mass 2015   Found incidently on CXR- Saw ENT and for visualization and nothing was there.    GERD (gastroesophageal reflux disease)    Hyperlipidemia    Osteoporosis    Past  Surgical History:  Procedure Laterality Date   COLONOSCOPY WITH PROPOFOL N/A 02/06/2021   Procedure: COLONOSCOPY WITH PROPOFOL;  Surgeon: Virgel Manifold, MD;  Location: [facility] ENDOSCOPY;  Service: Endoscopy;  Laterality: N/A;   EYE SURGERY     cataract surgery    NOSE SURGERY     Cartilage removed from left side of nose   Family History  Problem Relation Age of Onset   Heart disease Mother    Diabetes Mother    Hiatal hernia Mother    Arthritis Mother    Cancer Father 58       Colon   Colitis Sister    Irritable bowel syndrome Sister    Arthritis Sister    Diabetes Brother    Stroke Maternal Grandmother    Pneumonia Maternal Grandmother    Kidney failure Paternal Grandmother    Leukemia Paternal Grandfather    Cancer Paternal Grandfather        Leukemia   Heart disease Sister    Irregular heart beat Sister    Thyroid disease Sister    Social History   Socioeconomic History   Marital status: Divorced    Spouse name: Not on file   Number of children: Not on file   Years of education: Not on file   Highest education level: Bachelor's degree (e.g., BA, AB, BS)  Occupational History   Not on file  Tobacco Use   Smoking status: Former    Types: Cigarettes  Quit date: 07/09/1971    Years since quitting: 49.8   Smokeless tobacco: Never  Vaping Use   Vaping Use: Never used  Substance and Sexual Activity   Alcohol use: Yes    Comment: wine   Drug use: No   Sexual activity: Never  Other Topics Concern   Not on file  Social History Narrative   Not on file   Social Determinants of Health   Financial Resource Strain: Low Risk    Difficulty of Paying Living Expenses: Not hard at all  Food Insecurity: No Food Insecurity   Worried About Charity fundraiser in the Last Year: Never true   [place] in the Last Year: Never true  Transportation Needs: No Transportation Needs   Lack of Transportation (Medical): No   Lack of Transportation (Non-Medical): No   Physical Activity: Sufficiently Active   Days of Exercise per Week: 4 days   Minutes of Exercise per Session: 40 min  Stress: No Stress Concern Present   Feeling of Stress : Not at all  Social Connections: Moderately Isolated   Frequency of Communication with Friends and Family: More than three times a week   Frequency of Social Gatherings with Friends and Family: Three times a week   Attends Religious Services: 1 to 4 times per year   Active Member of Clubs or Organizations: No   Attends Archivist Meetings: Never   Marital Status: Divorced    Tobacco Counseling Counseling given: Not Answered   Clinical Intake:  Pre-visit preparation completed: Yes  Pain : No/denies pain     Nutritional Risks: None Diabetes: No  How often do you need to have someone help you when you read instructions, pamphlets, or other written materials from your doctor or pharmacy?: 1 - Never  Diabetic?  NO  Interpreter Needed?: No  Information entered by :: Leroy Kennedy LPN   Activities of Daily Living In your present state of health, do you have any difficulty performing the following activities: 05/17/2021 12/15/2020  Hearing? N N  Vision? N Y  Difficulty concentrating or making decisions? N N  Walking or climbing stairs? N N  Dressing or bathing? N N  Doing errands, shopping? N N  Preparing Food and eating ? N -  Using the Toilet? N -  In the past six months, have you accidently leaked urine? N -  Do you have problems with loss of bowel control? N -  Managing your Medications? N -  Managing your Finances? N -  Housekeeping or managing your Housekeeping? N -  Some recent data might be hidden    Patient Care Team: Valerie Roys, DO as PCP - General (Family Medicine)  Indicate any recent Medical Services you may have received from other than [facility] providers in the past year (date may be approximate).     Assessment:   This is a routine wellness examination for  Angie Copeland.  Hearing/Vision screen Hearing Screening - Comments:: No trouble hearing Vision Screening - Comments:: Not up to date Has seen doctor Bell Is going to look for new doctor  Dietary issues and exercise activities discussed: Current Exercise Habits: Home exercise routine, Type of exercise: walking, Time (Minutes): 40, Frequency (Times/Week): 4, Weekly Exercise (Minutes/Week): 160, Intensity: Mild   Goals Addressed             This Visit's Progress    DIET - DECREASE SODA OR JUICE INTAKE   On track    Decrease  soda intake and increase water intake to 6-8 glasses of water a day      Increase physical activity       Increase walking       Depression Screen PHQ 2/9 Scores 05/17/2021 12/15/2020 03/22/2019 12/23/2018 12/19/2017 12/17/2016 10/15/2016  PHQ - 2 Score 0 0 0 1 0 0 0  PHQ- 9 Score - - 1 - 4 - -    Fall Risk Fall Risk  05/17/2021 12/15/2020 03/22/2019 12/23/2018 12/19/2017  Falls in the past year? 0 0 0 0 No  Number falls in past yr: 0 0 0 - -  Injury with Fall? 0 0 0 - -  Risk for fall due to : - No Fall Risks - - -  Follow up Falls evaluation completed;Falls prevention discussed Falls evaluation completed - - -    FALL RISK PREVENTION PERTAINING TO THE HOME:  Any stairs in or around the home? Yes  If so, are there any without handrails? No  Home free of loose throw rugs in walkways, pet beds, electrical cords, etc? Yes  Adequate lighting in your home to reduce risk of falls? Yes   ASSISTIVE DEVICES UTILIZED TO PREVENT FALLS:  Life alert? No  Use of a cane, walker or w/c? No  Grab bars in the bathroom? Yes  Shower chair or bench in shower? No  Elevated toilet seat or a handicapped toilet? No   TIMED UP AND GO:  Was the test performed? No .    Cognitive Function:  Normal cognitive status assessed by direct observation by this Nurse Health Advisor. No abnormalities found.       6CIT Screen 12/19/2017 12/17/2016  What Year? 0 points 0 points  What month? 0  points 0 points  What time? 0 points 0 points  Count back from 20 0 points 2 points  Months in reverse 0 points 0 points  Repeat phrase 0 points 0 points  Total Score 0 2    Immunizations Immunization History  Administered Date(s) Administered   Fluad Quad(high Dose 65+) 03/22/2019   Influenza, High Dose Seasonal PF 04/11/2017   Influenza-Unspecified 04/07/2016, 04/19/2021   PFIZER(Purple Top)SARS-COV-2 Vaccination 08/30/2019, 09/20/2019, 01/19/2021   Pneumococcal Conjugate-13 12/17/2016   Pneumococcal Polysaccharide-23 03/22/2019   Tdap 01/20/2012    TDAP status: Up to date  Flu Vaccine status: Up to date  Pneumococcal vaccine status: Up to date  Covid-19 vaccine status: Completed vaccines  Qualifies for Shingles Vaccine? Yes   Zostavax completed No   Shingrix Completed?: No.    Education has been provided regarding the importance of this vaccine. Patient has been advised to call insurance company to determine out of pocket expense if they have not yet received this vaccine. Advised may also receive vaccine at local pharmacy or Health Dept. Verbalized acceptance and understanding.  Screening Tests Health Maintenance  Topic Date Due   Zoster Vaccines- Shingrix (1 of 2) Never done   COVID-19 Vaccine (4 - Booster for Pfizer series) 03/16/2021   TETANUS/TDAP  01/19/2022   MAMMOGRAM  12/27/2022   DEXA SCAN  12/27/2023   COLONOSCOPY (Pts 45-72yrs Insurance coverage will need to be confirmed)  02/06/2026   Pneumonia Vaccine 89+ Years old  Completed   INFLUENZA VACCINE  Completed   Hepatitis C Screening  Completed   HPV VACCINES  Aged Out    Health Maintenance  Health Maintenance Due  Topic Date Due   Zoster Vaccines- Shingrix (1 of 2) Never done   COVID-19 Vaccine (4 -  Booster for [employer] series) 03/16/2021    Colorectal cancer screening: Type of screening: Colonoscopy. Completed 2022. Repeat every 5 years  Mammogram status: Completed 2022. Repeat every year  Bone  Density status: Completed 2022. Results reflect: Bone density results: OSTEOPOROSIS. Repeat every 2 years.  Lung Cancer Screening: (Low Dose CT Chest recommended if Age 50-80 years, 30 pack-year currently smoking OR have quit w/in 15years.) does not qualify.   Lung Cancer Screening Referral:   Additional Screening:  Hepatitis C Screening: does not qualify;   Vision Screening: Recommended annual ophthalmology exams for early detection of glaucoma and other disorders of the eye. Is the patient up to date with their annual eye exam?  Yes  Who is the provider or what is the name of the office in which the patient attends annual eye exams? Will be looking for a new doctor If pt is not established with a provider, would they like to be referred to a provider to establish care? No .   Dental Screening: Recommended annual dental exams for proper oral hygiene  Community Resource Referral / Chronic Care Management: CRR required this visit?  No   CCM required this visit?  No      Plan:     I have personally reviewed and noted the following in the patient's chart:   Medical and social history Use of alcohol, tobacco or illicit drugs  Current medications and supplements including opioid prescriptions.  Functional ability and status Nutritional status Physical activity Advanced directives List of other physicians Hospitalizations, surgeries, and ER visits in previous 12 months Vitals Screenings to include cognitive, depression, and falls Referrals and appointments  In addition, I have reviewed and discussed with patient certain preventive protocols, quality metrics, and best practice recommendations. A written personalized care plan for preventive services as well as general preventive health recommendations were provided to patient.     Leroy Kennedy, LPN   [id number]   Nurse Notes:

## 2021-05-17 NOTE — Patient Instructions (Signed)
Angie Copeland , Thank you for taking time to come for your Medicare Wellness Visit. I appreciate your ongoing commitment to your health goals. Please review the following plan we discussed and let me know if I can assist you in the future.   Screening recommendations/referrals: Colonoscopy: up to date Mammogram: up to date Bone Density: up to date Recommended yearly ophthalmology/optometry visit for glaucoma screening and checkup Recommended yearly dental visit for hygiene and checkup  Vaccinations: Influenza vaccine: up to date Pneumococcal vaccine: up to date Tdap vaccine: up to date Shingles vaccine: Education provided    Advanced directives: not on file  Conditions/risks identified:      Preventive Care 41 Years and Older, Female Preventive care refers to lifestyle choices and visits with your health care provider that can promote health and wellness. What does preventive care include? A yearly physical exam. This is also called an annual well check. Dental exams once or twice a year. Routine eye exams. Ask your health care provider how often you should have your eyes checked. Personal lifestyle choices, including: Daily care of your teeth and gums. Regular physical activity. Eating a healthy diet. Avoiding tobacco and drug use. Limiting alcohol use. Practicing safe sex. Taking low-dose aspirin every day. Taking vitamin and mineral supplements as recommended by your health care provider. What happens during an annual well check? The services and screenings done by your health care provider during your annual well check will depend on your age, overall health, lifestyle risk factors, and family history of disease. Counseling  Your health care provider may ask you questions about your: Alcohol use. Tobacco use. Drug use. Emotional well-being. Home and relationship well-being. Sexual activity. Eating habits. History of falls. Memory and ability to understand  (cognition). Work and work Statistician. Reproductive health. Screening  You may have the following tests or measurements: Height, weight, and BMI. Blood pressure. Lipid and cholesterol levels. These may be checked every 5 years, or more frequently if you are over 61 years old. Skin check. Lung cancer screening. You may have this screening every year starting at age 82 if you have a 30-pack-year history of smoking and currently smoke or have quit within the past 15 years. Fecal occult blood test (FOBT) of the stool. You may have this test every year starting at age 92. Flexible sigmoidoscopy or colonoscopy. You may have a sigmoidoscopy every 5 years or a colonoscopy every 10 years starting at age 33. Hepatitis C blood test. Hepatitis B blood test. Sexually transmitted disease (STD) testing. Diabetes screening. This is done by checking your blood sugar (glucose) after you have not eaten for a while (fasting). You may have this done every 1-3 years. Bone density scan. This is done to screen for osteoporosis. You may have this done starting at age 37. Mammogram. This may be done every 1-2 years. Talk to your health care provider about how often you should have regular mammograms. Talk with your health care provider about your test results, treatment options, and if necessary, the need for more tests. Vaccines  Your health care provider may recommend certain vaccines, such as: Influenza vaccine. This is recommended every year. Tetanus, diphtheria, and acellular pertussis (Tdap, Td) vaccine. You may need a Td booster every 10 years. Zoster vaccine. You may need this after age 75. Pneumococcal 13-valent conjugate (PCV13) vaccine. One dose is recommended after age 29. Pneumococcal polysaccharide (PPSV23) vaccine. One dose is recommended after age 19. Talk to your health care provider about which screenings and  vaccines you need and how often you need them. This information is not intended to  replace advice given to you by your health care provider. Make sure you discuss any questions you have with your health care provider. Document Released: 07/21/2015 Document Revised: 03/13/2016 Document Reviewed: 04/25/2015 Elsevier Interactive Patient Education  2017 [place] Prevention in the Home Falls can cause injuries. They can happen to people of all ages. There are many things you can do to make your home safe and to help prevent falls. What can I do on the outside of my home? Regularly fix the edges of walkways and driveways and fix any cracks. Remove anything that might make you trip as you walk through a door, such as a raised step or threshold. Trim any bushes or trees on the path to your home. Use bright outdoor lighting. Clear any walking paths of anything that might make someone trip, such as rocks or tools. Regularly check to see if handrails are loose or broken. Make sure that both sides of any steps have handrails. Any raised decks and porches should have guardrails on the edges. Have any leaves, snow, or ice cleared regularly. Use sand or salt on walking paths during winter. Clean up any spills in your garage right away. This includes oil or grease spills. What can I do in the bathroom? Use night lights. Install grab bars by the toilet and in the tub and shower. Do not use towel bars as grab bars. Use non-skid mats or decals in the tub or shower. If you need to sit down in the shower, use a plastic, non-slip stool. Keep the floor dry. Clean up any water that spills on the floor as soon as it happens. Remove soap buildup in the tub or shower regularly. Attach bath mats securely with double-sided non-slip rug tape. Do not have throw rugs and other things on the floor that can make you trip. What can I do in the bedroom? Use night lights. Make sure that you have a light by your bed that is easy to reach. Do not use any sheets or blankets that are too big for  your bed. They should not hang down onto the floor. Have a firm chair that has side arms. You can use this for support while you get dressed. Do not have throw rugs and other things on the floor that can make you trip. What can I do in the kitchen? Clean up any spills right away. Avoid walking on wet floors. Keep items that you use a lot in easy-to-reach places. If you need to reach something above you, use a strong step stool that has a grab bar. Keep electrical cords out of the way. Do not use floor polish or wax that makes floors slippery. If you must use wax, use non-skid floor wax. Do not have throw rugs and other things on the floor that can make you trip. What can I do with my stairs? Do not leave any items on the stairs. Make sure that there are handrails on both sides of the stairs and use them. Fix handrails that are broken or loose. Make sure that handrails are as long as the stairways. Check any carpeting to make sure that it is firmly attached to the stairs. Fix any carpet that is loose or worn. Avoid having throw rugs at the top or bottom of the stairs. If you do have throw rugs, attach them to the floor with carpet tape. Make  sure that you have a light switch at the top of the stairs and the bottom of the stairs. If you do not have them, ask someone to add them for you. What else can I do to help prevent falls? Wear shoes that: Do not have high heels. Have rubber bottoms. Are comfortable and fit you well. Are closed at the toe. Do not wear sandals. If you use a stepladder: Make sure that it is fully opened. Do not climb a closed stepladder. Make sure that both sides of the stepladder are locked into place. Ask someone to hold it for you, if possible. Clearly mark and make sure that you can see: Any grab bars or handrails. First and last steps. Where the edge of each step is. Use tools that help you move around (mobility aids) if they are needed. These  include: Canes. Walkers. Scooters. Crutches. Turn on the lights when you go into a dark area. Replace any light bulbs as soon as they burn out. Set up your furniture so you have a clear path. Avoid moving your furniture around. If any of your floors are uneven, fix them. If there are any pets around you, be aware of where they are. Review your medicines with your doctor. Some medicines can make you feel dizzy. This can increase your chance of falling. Ask your doctor what other things that you can do to help prevent falls. This information is not intended to replace advice given to you by your health care provider. Make sure you discuss any questions you have with your health care provider. Document Released: 04/20/2009 Document Revised: 11/30/2015 Document Reviewed: 07/29/2014 Elsevier Interactive Patient Education  2017 [employer].

## 2021-05-18 ENCOUNTER — Ambulatory Visit: Payer: Medicare PPO

## 2021-05-21 NOTE — Progress Notes (Addendum)
I connected with  Angie Copeland on 05-17-2021 by a telephone enabled telemedicine application and verified that I am speaking with the correct person using two identifiers.   I discussed the limitations of evaluation and management by telemedicine. The patient expressed understanding and agreed to proceed.  Patient location: home  Provider location:  tele-health visit   not in office

## 2022-02-12 DIAGNOSIS — Z961 Presence of intraocular lens: Secondary | ICD-10-CM | POA: Diagnosis not present

## 2022-04-11 ENCOUNTER — Ambulatory Visit (LOCAL_COMMUNITY_HEALTH_CENTER): Payer: Medicare PPO

## 2022-04-11 DIAGNOSIS — Z23 Encounter for immunization: Secondary | ICD-10-CM | POA: Diagnosis not present

## 2022-04-11 DIAGNOSIS — Z719 Counseling, unspecified: Secondary | ICD-10-CM

## 2022-04-11 NOTE — Progress Notes (Signed)
In nurse clinic and requests Covid and flu vaccines.  VISs given.  Comirnaty Covid and flu vaccines administered; tolerated well.  Pt stayed for 15 minutes observation.   [facility] updated and copy to pt.   Are you feeling sick today? No   Have you ever received a dose of COVID-19 Vaccine? [employer], [place], [place], [place], Other) Yes  If yes, which vaccine and how many doses?   5 doses Pfizer   Did you bring the vaccination record card or other documentation?  Yes   Do you have a health condition or are undergoing treatment that makes you moderately or severely immunocompromised? This would include, but not be limited to: cancer, HIV, organ transplant, immunosuppressive therapy/high-dose corticosteroids, or moderate/severe primary immunodeficiency.  No  Have you received COVID-19 vaccine before or during hematopoietic cell transplant (HCT) or CAR-T-cell therapies? No  Have you ever had an allergic reaction to: (This would include a severe allergic reaction or a reaction that caused hives, swelling, or respiratory distress, including wheezing.) A component of a COVID-19 vaccine or a previous dose of COVID-19 vaccine? No   Have you ever had an allergic reaction to another vaccine (other thanCOVID-19 vaccine) or an injectable medication? (This would include a severe allergic reaction or a reaction that caused hives, swelling, or respiratory distress, including wheezing.)   No  Do you have a history of any of the following:  Myocarditis or Pericarditis No  Dermal fillers:  No  Multisystem Inflammatory Syndrome (MIS-C or MIS-A)? No  COVID-19 disease within the past 3 months? No  Vaccinated with monkeypox vaccine in the last 4 weeks? No  Tonny Branch, RN

## 2022-04-18 DIAGNOSIS — Z961 Presence of intraocular lens: Secondary | ICD-10-CM | POA: Diagnosis not present

## 2022-04-18 DIAGNOSIS — H26493 Other secondary cataract, bilateral: Secondary | ICD-10-CM | POA: Diagnosis not present

## 2022-04-18 DIAGNOSIS — H02831 Dermatochalasis of right upper eyelid: Secondary | ICD-10-CM | POA: Diagnosis not present

## 2022-04-18 DIAGNOSIS — H18413 Arcus senilis, bilateral: Secondary | ICD-10-CM | POA: Diagnosis not present

## 2022-04-18 DIAGNOSIS — H26492 Other secondary cataract, left eye: Secondary | ICD-10-CM | POA: Diagnosis not present

## 2022-05-13 ENCOUNTER — Ambulatory Visit: Payer: Self-pay | Admitting: *Deleted

## 2022-05-13 NOTE — Telephone Encounter (Signed)
  Chief Complaint: + COVID Symptoms: fatigue, headache, feverish, fatigue Frequency: symptoms started late Saturday  Pertinent Negatives: Patient denies SOB Disposition: '[]'$ ED /'[]'$ Urgent Care (no appt availability in office) / '[x]'$ Appointment(In office/virtual)/ '[]'$  [place] [facility]/ '[]'$ Home Care/ '[]'$ Refused Recommended Disposition /'[]'$ [place] [facility]/ '[]'$  Follow-up with PCP Additional Notes:  Patient advised COVID protocol/isolation , virtual appointment made

## 2022-05-13 NOTE — Telephone Encounter (Signed)
2nd attempt to contact patient regarding covid positive sx. No answer, unable to leave VM. VM has not been set up.

## 2022-05-13 NOTE — Telephone Encounter (Signed)
Reason for Disposition . [1] HIGH RISK patient (e.g., weak immune system, age > 51 years, obesity with BMI 30 or higher, pregnant, chronic lung disease or other chronic medical condition) AND [2] COVID symptoms (e.g., cough, fever)  (Exceptions: Already seen by PCP and no new or worsening symptoms.)  Answer Assessment - Initial Assessment Questions 1. COVID-19 DIAGNOSIS: "How do you know that you have COVID?" (e.g., positive lab test or self-test, diagnosed by doctor or NP/PA, symptoms after exposure).     Home test- + COVID 2. COVID-19 EXPOSURE: "Was there any known exposure to COVID before the symptoms began?" CDC Definition of close contact: within 6 feet (2 meters) for a total of 15 minutes or more over a 24-hour period.      Recent travel 3. ONSET: "When did the COVID-19 symptoms start?"      Saturday afternoon- weak 4. WORST SYMPTOM: "What is your worst symptom?" (e.g., cough, fever, shortness of breath, muscle aches)     Weakness, headache,ear crackles  5. COUGH: "Do you have a cough?" If Yes, ask: "How bad is the cough?"       Not bad 6. FEVER: "Do you have a fever?" If Yes, ask: "What is your temperature, how was it measured, and when did it start?"     Has not checked- chills 7. RESPIRATORY STATUS: "Describe your breathing?" (e.g., normal; shortness of breath, wheezing, unable to speak)      normal 8. BETTER-SAME-WORSE: "Are you getting better, staying the same or getting worse compared to yesterday?"  If getting worse, ask, "In what way?"     Little better 9. OTHER SYMPTOMS: "Do you have any other symptoms?"  (e.g., chills, fatigue, headache, loss of smell or taste, muscle pain, sore throat)     Fatigue, headache 10. HIGH RISK DISEASE: "Do you have any chronic medical problems?" (e.g., asthma, heart or lung disease, weak immune system, obesity, etc.)       Age 71. VACCINE: "Have you had the COVID-19 vaccine?" If Yes, ask: "Which one, how many shots, when did you get it?"        Yes  Protocols used: Coronavirus (COVID-19) Diagnosed or Suspected-A-AH

## 2022-05-13 NOTE — Telephone Encounter (Signed)
Third attempt to return pt's call without success.   Per policy forwarding this call to [facility].

## 2022-05-13 NOTE — Telephone Encounter (Signed)
Message from Sharene Skeans sent at 05/13/2022  8:07 AM EST  Summary: covid and medication   Pt took a positive covid test yesterday / pt is experiencing chills(yesterday), headache, runny nose and phlegm. Pt asked if medication to be called for her /she also asked about hen she can be around people and out /  please advise          Call History   Type Contact Phone/Fax User  05/13/2022 08:06 AM EST Phone ([address]) Angie Copeland, Angie Copeland (Self) [phone] Lemmie Evens) Sharene Skeans

## 2022-05-13 NOTE — Telephone Encounter (Signed)
Attempted to return pt's call.   Voicemail not set up.  Will try again later.

## 2022-05-14 ENCOUNTER — Telehealth (INDEPENDENT_AMBULATORY_CARE_PROVIDER_SITE_OTHER): Payer: Medicare PPO | Admitting: Physician Assistant

## 2022-05-14 ENCOUNTER — Encounter: Payer: Self-pay | Admitting: Physician Assistant

## 2022-05-14 DIAGNOSIS — U071 COVID-19: Secondary | ICD-10-CM | POA: Diagnosis not present

## 2022-05-14 NOTE — Assessment & Plan Note (Signed)
Acute, new concern Patient has been traveling internationally for the past few weeks and reports she tested positive for COVID with an at-home test on Sunday  She reports she has had intermittent chills, productive cough, and muscle cramping  We discussed using antivirals vs symptomatic management and patient decided she would like to try to manage with OTC medications and supplements Discussed adding Pedialyte or Gatorade daily to assist with cramping and appropriate OTC medications for symptoms If she notes worsening symptoms or persistent symptoms by Friday she should come in to office for evaluation - she voiced understanding and agreement Follow up as needed

## 2022-05-14 NOTE — Progress Notes (Signed)
Virtual Visit via Telephone Note  I connected with Angie Copeland on 05/14/22 at  8:20 AM EST by telephone and verified that I am speaking with the correct person using two identifiers.  Today's Provider: Talitha Givens, MHS, PA-C Introduced myself to the patient as a PA-C and provided education on APPs in clinical practice.     Location: Patient: At home, Angie Copeland, [place]  Provider: [facility], Angie Copeland, [place]    I discussed the limitations, risks, security and privacy concerns of performing an evaluation and management service by telephone and the availability of in person appointments. I also discussed with the patient that there may be a patient responsible charge related to this service. The patient expressed understanding and agreed to proceed.   Chief Complaint  Patient presents with   Covid Positive    Tested positive on Sunday,  Muscle cramping in neck, shoulders, sides, cough, ears popping. Patient states that she had a flu vacc and covid booster in Sept.      History of Present Illness:  She returned from a trip to [place] and [place] on Friday- reports she felt exhausted all weekend and developed chills on Sunday  COVID positive since Sunday  Onset: sudden  Duration: symptoms started on  Symptoms: chills, intermittent productive cough, cramping in face and shoulders as well as hands Alleviating: Aleve  Aggravating: Interventions: hot teas, water, Fluticasone nasal spray, muscle cramp nasal spray, Aleve  She reports she is having cramps when she tries to move - states she has been trying to supplement vitamins with food rather than supplement  She states she likes to manage her symptoms and problems without medications as much as possible    Review of Systems  Constitutional:  Positive for diaphoresis. Negative for chills and fever.  HENT:  Negative for congestion, ear pain and sore throat.        Ear fullness  Respiratory:  Positive for cough and sputum  production. Negative for shortness of breath and wheezing.   Cardiovascular:  Positive for leg swelling (line from socks). Negative for chest pain.  Gastrointestinal:  Negative for constipation, diarrhea, nausea and vomiting.  Musculoskeletal:  Positive for myalgias.  Neurological:  Positive for headaches. Negative for dizziness.      Observations/Objective:   Due to the nature of the virtual visit, physical exam and observations are limited. Able to obtain the following:   Alert, oriented, x3 Sounds comfortable, in no acute distress.  no appreciated hoarseness, tachypnea, wheeze or strider. Able to maintain conversation without audible strain.  No cough appreciated during visit.      Assessment and Plan:  Problem List Items Addressed This Visit       Other   COVID-19 - Primary    Acute, new concern Patient has been traveling internationally for the past few weeks and reports she tested positive for COVID with an at-home test on Sunday  She reports she has had intermittent chills, productive cough, and muscle cramping  We discussed using antivirals vs symptomatic management and patient decided she would like to try to manage with OTC medications and supplements Discussed adding Pedialyte or Gatorade daily to assist with cramping and appropriate OTC medications for symptoms If she notes worsening symptoms or persistent symptoms by Friday she should come in to office for evaluation - she voiced understanding and agreement Follow up as needed        Follow Up Instructions:    I discussed the assessment and  treatment plan with the patient. The patient was provided an opportunity to ask questions and all were answered. The patient agreed with the plan and demonstrated an understanding of the instructions.   The patient was advised to call back or seek an in-person evaluation if the symptoms worsen or if the condition fails to improve as anticipated.  I provided 15 minutes  of non-face-to-face time during this encounter.  No follow-ups on file.   I, Belkys Henault E Sanaya Gwilliam, PA-C, have reviewed all documentation for this visit. The documentation on 05/14/22 for the exam, diagnosis, procedures, and orders are all accurate and complete.   Talitha Givens, MHS, PA-C [facility]

## 2022-05-15 ENCOUNTER — Ambulatory Visit: Payer: Self-pay

## 2022-05-15 ENCOUNTER — Telehealth: Payer: Self-pay | Admitting: Family Medicine

## 2022-05-15 NOTE — Telephone Encounter (Signed)
N/A unable to leave a message for patient to call back and schedule the Medicare Annual Wellness Visit (AWV) virtually or by telephone.  Last AWV 05/17/21  Please schedule at anytime with [facility]-Nurse Health Advisor.    Any questions, please call me at [phone]

## 2022-05-15 NOTE — Telephone Encounter (Signed)
  Chief Complaint: chronic numbness and tingling to hands and feet Symptoms: numbness and tingling to hands and feet that comes and goes, hand stiffness and cramping, occasional lightheadedness with sudden standing or with turning quickly Frequency: since Shingles shot 2010 Pertinent Negatives: Patient denies headache, vision loss, dizziness, vision loss or double vision, changes in speech, or steadiness on feet Disposition: '[]'$ ED /'[]'$ Urgent Care (no appt availability in office) / '[x]'$ Appointment(In office/virtual)/ '[]'$  [facility] [facility]/ '[]'$ Home Care/ '[]'$ Refused Recommended Disposition /'[]'$ [place] [facility]/ '[]'$  Follow-up with PCP Additional Notes: appt scheduled for Friday with PCP.  Reason for Disposition  [1] Numbness or tingling in one or both hands AND [2] is a chronic symptom (recurrent or ongoing AND present > 4 weeks)  [1] Numbness or tingling in one or both feet AND [2] is a chronic symptom (recurrent or ongoing AND present > 4 weeks)  Answer Assessment - Initial Assessment Questions 1. SYMPTOM: "What is the main symptom you are concerned about?" (e.g., weakness, numbness)     Numbness, burning to hands feet, occasional pain to right arm "that feels like someone hit her on the arm"  2. ONSET: "When did this start?" (minutes, hours, days; while sleeping)     Since Shingles shot and Flu  2010 3. LAST NORMAL: "When was the last time you (the patient) were normal (no symptoms)?"     Before Shingles shot 4. PATTERN "Does this come and go, or has it been constant since it started?"  "Is it present now?"     Comes and goes 5. CARDIAC SYMPTOMS: "Have you had any of the following symptoms: chest pain, difficulty breathing, palpitations?"     no 6. NEUROLOGIC SYMPTOMS: "Have you had any of the following symptoms: headache, dizziness, vision loss, double vision, changes in speech, unsteady on your feet?"     Occasional lightheadedness with standing or turning quickly 7. OTHER SYMPTOMS: "Do  you have any other symptoms?"     Numbness to feet and hands, wakes up at night due to hands and feet falling asleep, stiffness, foot bruise on foot  left foot on bunion beside great toe- deep purple in color- no pain to touch 6 weeks ago, little fingers on both hands go out of joint and stiffness   8. PREGNANCY: "Is there any chance you are pregnant?" "When was your last menstrual period?"     N/a  Protocols used: Neurologic Deficit-A-AH

## 2022-05-17 ENCOUNTER — Encounter: Payer: Self-pay | Admitting: Family Medicine

## 2022-05-17 ENCOUNTER — Ambulatory Visit: Payer: Medicare PPO | Admitting: Family Medicine

## 2022-05-17 VITALS — BP 137/55 | HR 65 | Temp 98.7°F | Ht 63.1 in | Wt 152.6 lb

## 2022-05-17 DIAGNOSIS — F419 Anxiety disorder, unspecified: Secondary | ICD-10-CM | POA: Diagnosis not present

## 2022-05-17 DIAGNOSIS — Z1283 Encounter for screening for malignant neoplasm of skin: Secondary | ICD-10-CM

## 2022-05-17 DIAGNOSIS — Z0000 Encounter for general adult medical examination without abnormal findings: Secondary | ICD-10-CM

## 2022-05-17 DIAGNOSIS — E559 Vitamin D deficiency, unspecified: Secondary | ICD-10-CM

## 2022-05-17 DIAGNOSIS — Z1231 Encounter for screening mammogram for malignant neoplasm of breast: Secondary | ICD-10-CM

## 2022-05-17 DIAGNOSIS — Z7189 Other specified counseling: Secondary | ICD-10-CM

## 2022-05-17 DIAGNOSIS — E782 Mixed hyperlipidemia: Secondary | ICD-10-CM | POA: Diagnosis not present

## 2022-05-17 DIAGNOSIS — Z23 Encounter for immunization: Secondary | ICD-10-CM

## 2022-05-17 DIAGNOSIS — S41101A Unspecified open wound of right upper arm, initial encounter: Secondary | ICD-10-CM

## 2022-05-17 LAB — URINALYSIS, ROUTINE W REFLEX MICROSCOPIC
Bilirubin, UA: NEGATIVE
Glucose, UA: NEGATIVE
Leukocytes,UA: NEGATIVE
Nitrite, UA: NEGATIVE
Protein,UA: NEGATIVE
RBC, UA: NEGATIVE
Specific Gravity, UA: 1.015 (ref 1.005–1.030)
Urobilinogen, Ur: 0.2 mg/dL (ref 0.2–1.0)
pH, UA: 5.5 (ref 5.0–7.5)

## 2022-05-17 NOTE — Patient Instructions (Signed)
Preventative Services:  Health Risk Assessment and Personalized Prevention Plan: Done today Bone Mass Measurements: up to date Breast Cancer Screening: Ordered today CVD Screening: Done today Cervical Cancer Screening: N/A Colon Cancer Screening: Up to date Depression Screening: Done today Diabetes Screening: Done today Glaucoma Screening: See your eye doctor Hepatitis B vaccine: N/A Hepatitis C screening: up to date HIV Screening: up to date Flu Vaccine: up to date Lung cancer Screening: N/A Obesity Screening: Done today Pneumonia Vaccines (2): up to date STI Screening: N/A  Please call to schedule your mammogram and/or bone density: [facility] [place]: [address], [place], [place] [address] Phone: [phone]  [facility] [address]. [place],  [place]  [id number] Phone: [phone]

## 2022-05-17 NOTE — Progress Notes (Unsigned)
BP (!) 137/55   Pulse 65   Temp 98.7 F (37.1 C) (Oral)   Ht 5' 3.1" (1.603 m)   Wt 152 lb 9.6 oz (69.2 kg)   SpO2 98%   BMI 26.95 kg/m    Subjective:    Patient ID: Angie Copeland, female    DOB: December 25, 1950, 71 y.o.   MRN: [id number]  HPI: Angie Copeland is a 71 y.o. female presenting on 05/17/2022 for comprehensive medical examination. Current medical complaints include:  ANXIETY/STRESS Duration: chronic Status:stable Anxious mood: no  Excessive worrying: no Irritability: no  Sweating: no Nausea: no Palpitations:no Hyperventilation: no Panic attacks: no Agoraphobia: no  Obscessions/compulsions: no Depressed mood: no    05/17/2022   11:24 AM 05/14/2022    8:25 AM 05/17/2021    9:18 AM 12/15/2020    1:44 PM 03/22/2019    3:39 PM  Depression screen PHQ 2/9  Decreased Interest 0 0 0 0 0  Down, Depressed, Hopeless 0 0 0 0 0  PHQ - 2 Score 0 0 0 0 0  Altered sleeping 0 0   1  Tired, decreased energy 1 0   0  Change in appetite 0 0   0  Feeling bad or failure about yourself  0 0   0  Trouble concentrating 0 0   0  Moving slowly or fidgety/restless 0 0   0  Suicidal thoughts 0 0   0  PHQ-9 Score 1 0   1  Difficult doing work/chores Not difficult at all Not difficult at all   Not difficult at all   Anhedonia: no Weight changes: no Insomnia: no   Hypersomnia: no Fatigue/loss of energy: no Feelings of worthlessness: no Feelings of guilt: no Impaired concentration/indecisiveness: no Suicidal ideations: no  Crying spells: no Recent Stressors/Life Changes: no   Relationship problems: no   Family stress: no     Financial stress: no    Job stress: no    Recent death/loss: no  HYPERLIPIDEMIA Hyperlipidemia status:  stable Satisfied with current treatment?  no Side effects:  no Medication compliance: N/A Past cholesterol meds: none Supplements: none Aspirin:  no The 10-year ASCVD risk score (Arnett DK, et al., 2019) is: 13.8%   Values used to calculate the  score:     Age: 66 years     Sex: Female     Is Non-Hispanic African American: No     Diabetic: No     Tobacco smoker: No     Systolic Blood Pressure: 341 mmHg     Is BP treated: No     HDL Cholesterol: 62 mg/dL     Total Cholesterol: 257 mg/dL Chest pain:  no Coronary artery disease:  no  Menopausal Symptoms: no  Functional Status Survey: Is the patient deaf or have difficulty hearing?: No Does the patient have difficulty seeing, even when wearing glasses/contacts?: Yes Does the patient have difficulty concentrating, remembering, or making decisions?: No Does the patient have difficulty walking or climbing stairs?: No Does the patient have difficulty dressing or bathing?: No Does the patient have difficulty doing errands alone such as visiting a doctor's office or shopping?: No     05/17/2022   11:23 AM 05/14/2022    8:25 AM 05/17/2021    9:03 AM 12/15/2020    1:41 PM 03/22/2019    3:40 PM  Fall Risk   Falls in the past year? 0 0 0 0 0  Number falls in past yr: 0 0 0  0 0  Injury with Fall? 0 0 0 0 0  Risk for fall due to : No Fall Risks No Fall Risks  No Fall Risks   Follow up Falls evaluation completed Falls evaluation completed Falls evaluation completed;Falls prevention discussed Falls evaluation completed     Depression Screen    05/17/2022   11:24 AM 05/14/2022    8:25 AM 05/17/2021    9:18 AM 12/15/2020    1:44 PM 03/22/2019    3:39 PM  Depression screen PHQ 2/9  Decreased Interest 0 0 0 0 0  Down, Depressed, Hopeless 0 0 0 0 0  PHQ - 2 Score 0 0 0 0 0  Altered sleeping 0 0   1  Tired, decreased energy 1 0   0  Change in appetite 0 0   0  Feeling bad or failure about yourself  0 0   0  Trouble concentrating 0 0   0  Moving slowly or fidgety/restless 0 0   0  Suicidal thoughts 0 0   0  PHQ-9 Score 1 0   1  Difficult doing work/chores Not difficult at all Not difficult at all   Not difficult at all   Advanced Directives Does patient have a HCPOA?     yes Does patient have a living will or MOST form?  yes  Past Medical History:  Past Medical History:  Diagnosis Date   Allergy    DDD (degenerative disc disease), lumbar    Esophageal mass 2015   Found incidently on CXR- Saw ENT and for visualization and nothing was there.    GERD (gastroesophageal reflux disease)    Hyperlipidemia    Osteoporosis     Surgical History:  Past Surgical History:  Procedure Laterality Date   COLONOSCOPY WITH PROPOFOL N/A 02/06/2021   Procedure: COLONOSCOPY WITH PROPOFOL;  Surgeon: Virgel Manifold, MD;  Location: [facility] ENDOSCOPY;  Service: Endoscopy;  Laterality: N/A;   EYE SURGERY     cataract surgery    NOSE SURGERY     Cartilage removed from left side of nose    Medications:  Current Outpatient Medications on File Prior to Visit  Medication Sig   diclofenac Sodium (VOLTAREN) 1 % GEL Apply 4 g topically 4 (four) times daily.   alendronate (FOSAMAX) 70 MG tablet Take 1 tablet (70 mg total) by mouth every 7 (seven) days. Take with a full glass of water on an empty stomach. (Patient not taking: Reported on 05/17/2021)   No current facility-administered medications on file prior to visit.    Allergies:  Allergies  Allergen Reactions   Penicillins Rash    Social History:  Social History   Socioeconomic History   Marital status: Divorced    Spouse name: Not on file   Number of children: Not on file   Years of education: Not on file   Highest education level: Bachelor's degree (e.g., BA, AB, BS)  Occupational History   Not on file  Tobacco Use   Smoking status: Former    Types: Cigarettes    Quit date: 07/09/1971    Years since quitting: 50.8   Smokeless tobacco: Never  Vaping Use   Vaping Use: Never used  Substance and Sexual Activity   Alcohol use: Yes    Comment: wine   Drug use: No   Sexual activity: Never  Other Topics Concern   Not on file  Social History Narrative   Not on file   Social Determinants of Health  Financial Resource Strain: Low Risk  (05/17/2021)   Overall Financial Resource Strain (CARDIA)    Difficulty of Paying Living Expenses: Not hard at all  Food Insecurity: No Food Insecurity (05/17/2021)   Hunger Vital Sign    Worried About Running Out of Food in the Last Year: Never true    Ran Out of Food in the Last Year: Never true  Transportation Needs: No Transportation Needs (05/17/2021)   PRAPARE - Hydrologist (Medical): No    Lack of Transportation (Non-Medical): No  Physical Activity: Sufficiently Active (05/17/2021)   Exercise Vital Sign    Days of Exercise per Week: 4 days    Minutes of Exercise per Session: 40 min  Stress: No Stress Concern Present (05/17/2021)   [place]    Feeling of Stress : Not at all  Social Connections: Moderately Isolated (05/17/2021)   Social Connection and Isolation Panel [NHANES]    Frequency of Communication with Friends and Family: More than three times a week    Frequency of Social Gatherings with Friends and Family: Three times a week    Attends Religious Services: 1 to 4 times per year    Active Member of Clubs or Organizations: No    Attends Archivist Meetings: Never    Marital Status: Divorced  Human resources officer Violence: Not At Risk (05/17/2021)   Humiliation, Afraid, Rape, and Kick questionnaire    Fear of Current or Ex-Partner: No    Emotionally Abused: No    Physically Abused: No    Sexually Abused: No   Social History   Tobacco Use  Smoking Status Former   Types: Cigarettes   Quit date: 07/09/1971   Years since quitting: 50.8  Smokeless Tobacco Never   Social History   Substance and Sexual Activity  Alcohol Use Yes   Comment: wine    Family History:  Family History  Problem Relation Age of Onset   Heart disease Mother    Diabetes Mother    Hiatal hernia Mother    Arthritis Mother    Cancer Father 70        Colon   Colitis Sister    Irritable bowel syndrome Sister    Arthritis Sister    Diabetes Brother    Stroke Maternal Grandmother    Pneumonia Maternal Grandmother    Kidney failure Paternal Grandmother    Leukemia Paternal Grandfather    Cancer Paternal Grandfather        Leukemia   Heart disease Sister    Irregular heart beat Sister    Thyroid disease Sister     Past medical history, surgical history, medications, allergies, family history and social history reviewed with patient today and changes made to appropriate areas of the chart.   Review of Systems  Constitutional:  Positive for fever (with COVID). Negative for chills, diaphoresis, malaise/fatigue and weight loss.  HENT: Negative.    Eyes: Negative.   Respiratory:  Positive for shortness of breath (with heavy exertion). Negative for cough, hemoptysis, sputum production and wheezing.   Cardiovascular:  Positive for leg swelling. Negative for chest pain, palpitations, orthopnea, claudication and PND.  Gastrointestinal:  Positive for diarrhea (with food choices) and heartburn (with food choices). Negative for abdominal pain, blood in stool, constipation, melena, nausea and vomiting.  Genitourinary: Negative.   Musculoskeletal:  Positive for myalgias. Negative for back pain, falls, joint pain and neck pain.  Skin: Negative.  Neurological:  Positive for tingling. Negative for dizziness, tremors, sensory change, speech change, focal weakness, seizures, loss of consciousness, weakness and headaches.  Endo/Heme/Allergies:  Positive for environmental allergies. Negative for polydipsia. Does not bruise/bleed easily.    All other ROS negative except what is listed above and in the HPI.      Objective:    BP (!) 137/55   Pulse 65   Temp 98.7 F (37.1 C) (Oral)   Ht 5' 3.1" (1.603 m)   Wt 152 lb 9.6 oz (69.2 kg)   SpO2 98%   BMI 26.95 kg/m   Wt Readings from Last 3 Encounters:  05/17/22 152 lb 9.6 oz (69.2 kg)  02/06/21  148 lb (67.1 kg)  12/15/20 153 lb (69.4 kg)    Physical Exam Vitals and nursing note reviewed.  Constitutional:      General: She is not in acute distress.    Appearance: Normal appearance. She is normal weight. She is not ill-appearing, toxic-appearing or diaphoretic.  HENT:     Head: Normocephalic and atraumatic.     Right Ear: Tympanic membrane, ear canal and external ear normal. There is no impacted cerumen.     Left Ear: Tympanic membrane, ear canal and external ear normal. There is no impacted cerumen.     Nose: Nose normal. No congestion or rhinorrhea.     Mouth/Throat:     Mouth: Mucous membranes are moist.     Pharynx: Oropharynx is clear. No oropharyngeal exudate or posterior oropharyngeal erythema.  Eyes:     General: No scleral icterus.       Right eye: No discharge.        Left eye: No discharge.     Extraocular Movements: Extraocular movements intact.     Conjunctiva/sclera: Conjunctivae normal.     Pupils: Pupils are equal, round, and reactive to light.  Neck:     Vascular: No carotid bruit.  Cardiovascular:     Rate and Rhythm: Normal rate and regular rhythm.     Pulses: Normal pulses.     Heart sounds: No murmur heard.    No friction rub. No gallop.  Pulmonary:     Effort: Pulmonary effort is normal. No respiratory distress.     Breath sounds: Normal breath sounds. No stridor. No wheezing, rhonchi or rales.  Chest:     Chest wall: No tenderness.  Abdominal:     General: Abdomen is flat. Bowel sounds are normal. There is no distension.     Palpations: Abdomen is soft. There is no mass.     Tenderness: There is no abdominal tenderness. There is no right CVA tenderness, left CVA tenderness, guarding or rebound.     Hernia: No hernia is present.  Genitourinary:    Comments: Breast and pelvic exams deferred with shared decision making Musculoskeletal:        General: No swelling, tenderness, deformity or signs of injury. Normal range of motion.     Cervical  back: Normal range of motion and neck supple. No rigidity. No muscular tenderness.     Right lower leg: No edema.     Left lower leg: No edema.  Lymphadenopathy:     Cervical: No cervical adenopathy.  Skin:    General: Skin is warm and dry.     Capillary Refill: Capillary refill takes less than 2 seconds.     Coloration: Skin is not jaundiced or pale.     Findings: No bruising, erythema, lesion or rash.  Comments: Well healing wound on R arm. Consistent with bug bite  Neurological:     General: No focal deficit present.     Mental Status: She is alert and oriented to person, place, and time. Mental status is at baseline.     Cranial Nerves: No cranial nerve deficit.     Sensory: No sensory deficit.     Motor: No weakness.     Coordination: Coordination normal.     Gait: Gait normal.     Deep Tendon Reflexes: Reflexes normal.  Psychiatric:        Mood and Affect: Mood normal.        Behavior: Behavior normal.        Thought Content: Thought content normal.        Judgment: Judgment normal.        05/17/2022   12:11 PM 12/19/2017   10:43 AM 12/17/2016   10:47 AM  6CIT Screen  What Year? 0 points 0 points 0 points  What month? 0 points 0 points 0 points  What time? 0 points 0 points 0 points  Count back from 20 0 points 0 points 2 points  Months in reverse 0 points 0 points 0 points  Repeat phrase 0 points 0 points 0 points  Total Score 0 points 0 points 2 points   Results for orders placed or performed in visit on 05/17/22  CBC with Differential/Platelet  Result Value Ref Range   WBC 4.8 3.4 - 10.8 x10E3/uL   RBC 4.84 3.77 - 5.28 x10E6/uL   Hemoglobin 14.9 11.1 - 15.9 g/dL   Hematocrit 44.8 34.0 - 46.6 %   MCV 93 79 - 97 fL   MCH 30.8 26.6 - 33.0 pg   MCHC 33.3 31.5 - 35.7 g/dL   RDW 12.0 11.7 - 15.4 %   Platelets 208 150 - 450 x10E3/uL   Neutrophils 47 Not Estab. %   Lymphs 43 Not Estab. %   Monocytes 8 Not Estab. %   Eos 1 Not Estab. %   Basos 1 Not Estab. %    Neutrophils Absolute 2.3 1.4 - 7.0 x10E3/uL   Lymphocytes Absolute 2.1 0.7 - 3.1 x10E3/uL   Monocytes Absolute 0.4 0.1 - 0.9 x10E3/uL   EOS (ABSOLUTE) 0.1 0.0 - 0.4 x10E3/uL   Basophils Absolute 0.0 0.0 - 0.2 x10E3/uL   Immature Granulocytes 0 Not Estab. %   Immature Grans (Abs) 0.0 0.0 - 0.1 x10E3/uL  Comprehensive metabolic panel  Result Value Ref Range   Glucose 72 70 - 99 mg/dL   BUN 16 8 - 27 mg/dL   Creatinine, Ser 0.87 0.57 - 1.00 mg/dL   eGFR 71 >59 mL/min/1.73   BUN/Creatinine Ratio 18 12 - 28   Sodium 139 134 - 144 mmol/L   Potassium 4.0 3.5 - 5.2 mmol/L   Chloride 100 96 - 106 mmol/L   CO2 22 20 - 29 mmol/L   Calcium 9.4 8.7 - 10.3 mg/dL   Total Protein 7.0 6.0 - 8.5 g/dL   Albumin 4.4 3.8 - 4.8 g/dL   Globulin, Total 2.6 1.5 - 4.5 g/dL   Albumin/Globulin Ratio 1.7 1.2 - 2.2   Bilirubin Total 0.3 0.0 - 1.2 mg/dL   Alkaline Phosphatase 57 44 - 121 IU/L   AST 20 0 - 40 IU/L   ALT 14 0 - 32 IU/L  Lipid Panel w/o Chol/HDL Ratio  Result Value Ref Range   Cholesterol, Total 257 (H) 100 - 199 mg/dL   Triglycerides  129 0 - 149 mg/dL   HDL 62 >39 mg/dL   VLDL Cholesterol Cal 23 5 - 40 mg/dL   LDL Chol Calc (NIH) 172 (H) 0 - 99 mg/dL  Urinalysis, Routine w reflex microscopic  Result Value Ref Range   Specific Gravity, UA 1.015 1.005 - 1.030   pH, UA 5.5 5.0 - 7.5   Color, UA Yellow Yellow   Appearance Ur Clear Clear   Leukocytes,UA Negative Negative   Protein,UA Negative Negative/Trace   Glucose, UA Negative Negative   Ketones, UA Trace (A) Negative   RBC, UA Negative Negative   Bilirubin, UA Negative Negative   Urobilinogen, Ur 0.2 0.2 - 1.0 mg/dL   Nitrite, UA Negative Negative   Microscopic Examination Comment   TSH  Result Value Ref Range   TSH 1.950 0.450 - 4.500 uIU/mL  VITAMIN D 25 Hydroxy (Vit-D Deficiency, Fractures)  Result Value Ref Range   Vit D, 25-Hydroxy 24.3 (L) 30.0 - 100.0 ng/mL      Assessment & Plan:   Problem List Items Addressed  This Visit       Other   Hyperlipidemia, unspecified    Rechecking labs today. Await results. Treat as needed.       Relevant Orders   CBC with Differential/Platelet (Completed)   Comprehensive metabolic panel (Completed)   Lipid Panel w/o Chol/HDL Ratio (Completed)   Anxiety    Doing well off medicine. Continue to monitor. Call with any concerns.       Relevant Orders   CBC with Differential/Platelet (Completed)   Urinalysis, Routine w reflex microscopic (Completed)   TSH (Completed)   Advance directive discussed with patient    A voluntary discussion about advance care planning including the explanation and discussion of advance directives was extensively discussed  with the patient for 5 minutes with patient present.  Explanation about the health care proxy and Living will was reviewed and packet with forms with explanation of how to fill them out was given.  During this discussion, the patient was able to identify a health care proxy as her children and has filled out the paperwork required- she will bring in a copy.  Patient was offered a separate [place] visit for further assistance with forms.         Vitamin D deficiency    Rechecking labs today. Await results. Treat as needed.      Relevant Orders   CBC with Differential/Platelet (Completed)   VITAMIN D 25 Hydroxy (Vit-D Deficiency, Fractures) (Completed)   Other Visit Diagnoses     Encounter for Medicare annual wellness exam    -  Primary   Preventative care discussed today as below.   Routine general medical examination at a health care facility       Vaccines up to date. Screening labs checked today. Mammo, DEXA, and colonoscopy up to date. Continue diet and exercise. Call with any concerns.   Screening for skin cancer       Relevant Orders   Ambulatory referral to Dermatology   Open wound of right upper arm, initial encounter       Well healing. Due for Td. Given today.   Relevant Orders   Td :  Tetanus/diphtheria >7yo Preservative  free (Completed)   Encounter for screening mammogram for malignant neoplasm of breast       Mammogram ordered today.   Relevant Orders   MM 3D SCREEN BREAST BILATERAL        Preventative Services:  Health Risk Assessment and Personalized Prevention Plan: Done today Bone Mass Measurements: up to date Breast Cancer Screening: Ordered today CVD Screening: Done today Cervical Cancer Screening: N/A Colon Cancer Screening: Up to date Depression Screening: Done today Diabetes Screening: Done today Glaucoma Screening: See your eye doctor Hepatitis B vaccine: N/A Hepatitis C screening: up to date HIV Screening: up to date Flu Vaccine: up to date Lung cancer Screening: N/A Obesity Screening: Done today Pneumonia Vaccines (2): up to date STI Screening: N/A  Follow up plan: Return in about 1 year (around 05/18/2023) for physical/wellness.   LABORATORY TESTING:  - Pap smear: not applicable  IMMUNIZATIONS:   - Tdap: Tetanus vaccination status reviewed: Td vaccination indicated and given today. - Influenza: Up to date - Pneumovax: Up to date - Prevnar: Up to date - Zostavax vaccine: Given elsewhere  SCREENING: -Mammogram: Ordered today  - Colonoscopy: Up to date  - Bone Density: Up to date   PATIENT COUNSELING:   Advised to take 1 mg of folate supplement per day if capable of pregnancy.   Sexuality: Discussed sexually transmitted diseases, partner selection, use of condoms, avoidance of unintended pregnancy  and contraceptive alternatives.   Advised to avoid cigarette smoking.  I discussed with the patient that most people either abstain from alcohol or drink within safe limits (<=14/week and <=4 drinks/occasion for males, <=7/weeks and <= 3 drinks/occasion for females) and that the risk for alcohol disorders and other health effects rises proportionally with the number of drinks per week and how often a drinker exceeds daily  limits.  Discussed cessation/primary prevention of drug use and availability of treatment for abuse.   Diet: Encouraged to adjust caloric intake to maintain  or achieve ideal body weight, to reduce intake of dietary saturated fat and total fat, to limit sodium intake by avoiding high sodium foods and not adding table salt, and to maintain adequate dietary potassium and calcium preferably from fresh fruits, vegetables, and low-fat dairy products.    stressed the importance of regular exercise  Injury prevention: Discussed safety belts, safety helmets, smoke detector, smoking near bedding or upholstery.   Dental health: Discussed importance of regular tooth brushing, flossing, and dental visits.    NEXT PREVENTATIVE PHYSICAL DUE IN 1 YEAR. Return in about 1 year (around 05/18/2023) for physical/wellness.

## 2022-05-18 LAB — LIPID PANEL W/O CHOL/HDL RATIO
Cholesterol, Total: 257 mg/dL — ABNORMAL HIGH (ref 100–199)
HDL: 62 mg/dL (ref >39)
LDL Chol Calc (NIH): 172 mg/dL — ABNORMAL HIGH (ref 0–99)
Triglycerides: 129 mg/dL (ref 0–149)
VLDL Cholesterol Cal: 23 mg/dL (ref 5–40)

## 2022-05-18 LAB — CBC WITH DIFFERENTIAL/PLATELET
Basophils Absolute: 0 10*3/uL (ref 0.0–0.2)
Basos: 1 %
EOS (ABSOLUTE): 0.1 10*3/uL (ref 0.0–0.4)
Eos: 1 %
Hematocrit: 44.8 % (ref 34.0–46.6)
Hemoglobin: 14.9 g/dL (ref 11.1–15.9)
Immature Grans (Abs): 0 10*3/uL (ref 0.0–0.1)
Immature Granulocytes: 0 %
Lymphocytes Absolute: 2.1 10*3/uL (ref 0.7–3.1)
Lymphs: 43 %
MCH: 30.8 pg (ref 26.6–33.0)
MCHC: 33.3 g/dL (ref 31.5–35.7)
MCV: 93 fL (ref 79–97)
Monocytes Absolute: 0.4 10*3/uL (ref 0.1–0.9)
Monocytes: 8 %
Neutrophils Absolute: 2.3 10*3/uL (ref 1.4–7.0)
Neutrophils: 47 %
Platelets: 208 10*3/uL (ref 150–450)
RBC: 4.84 x10E6/uL (ref 3.77–5.28)
RDW: 12 % (ref 11.7–15.4)
WBC: 4.8 10*3/uL (ref 3.4–10.8)

## 2022-05-18 LAB — COMPREHENSIVE METABOLIC PANEL
ALT: 14 IU/L (ref 0–32)
AST: 20 IU/L (ref 0–40)
Albumin/Globulin Ratio: 1.7 (ref 1.2–2.2)
Albumin: 4.4 g/dL (ref 3.8–4.8)
Alkaline Phosphatase: 57 IU/L (ref 44–121)
BUN/Creatinine Ratio: 18 (ref 12–28)
BUN: 16 mg/dL (ref 8–27)
Bilirubin Total: 0.3 mg/dL (ref 0.0–1.2)
CO2: 22 mmol/L (ref 20–29)
Calcium: 9.4 mg/dL (ref 8.7–10.3)
Chloride: 100 mmol/L (ref 96–106)
Creatinine, Ser: 0.87 mg/dL (ref 0.57–1.00)
Globulin, Total: 2.6 g/dL (ref 1.5–4.5)
Glucose: 72 mg/dL (ref 70–99)
Potassium: 4 mmol/L (ref 3.5–5.2)
Sodium: 139 mmol/L (ref 134–144)
Total Protein: 7 g/dL (ref 6.0–8.5)
eGFR: 71 mL/min/{1.73_m2} (ref >59)

## 2022-05-18 LAB — VITAMIN D 25 HYDROXY (VIT D DEFICIENCY, FRACTURES): Vit D, 25-Hydroxy: 24.3 ng/mL — ABNORMAL LOW (ref 30.0–100.0)

## 2022-05-18 LAB — TSH: TSH: 1.95 u[IU]/mL (ref 0.450–4.500)

## 2022-05-19 ENCOUNTER — Encounter: Payer: Self-pay | Admitting: Family Medicine

## 2022-05-19 NOTE — Assessment & Plan Note (Signed)
A voluntary discussion about advance care planning including the explanation and discussion of advance directives was extensively discussed  with the patient for 5 minutes with patient present.  Explanation about the health care proxy and Living will was reviewed and packet with forms with explanation of how to fill them out was given.  During this discussion, the patient was able to identify a health care proxy as her children and has filled out the paperwork required- she will bring in a copy.  Patient was offered a separate [place] visit for further assistance with forms.

## 2022-05-19 NOTE — Assessment & Plan Note (Signed)
Rechecking labs today. Await results. Treat as needed.  °

## 2022-05-19 NOTE — Assessment & Plan Note (Signed)
Doing well off medicine. Continue to monitor. Call with any concerns.  

## 2022-06-11 ENCOUNTER — Ambulatory Visit (INDEPENDENT_AMBULATORY_CARE_PROVIDER_SITE_OTHER): Payer: Medicare PPO | Admitting: Family Medicine

## 2022-06-11 ENCOUNTER — Ambulatory Visit
Admission: RE | Admit: 2022-06-11 | Discharge: 2022-06-11 | Disposition: A | Discharge status: Home / Self Care | Payer: Medicare PPO | Attending: Family Medicine | Admitting: Family Medicine

## 2022-06-11 ENCOUNTER — Ambulatory Visit
Admission: RE | Admit: 2022-06-11 | Discharge: 2022-06-11 | Disposition: A | Discharge status: Home / Self Care | Payer: Medicare PPO | Source: Ambulatory Visit | Attending: Family Medicine | Admitting: Family Medicine

## 2022-06-11 ENCOUNTER — Encounter: Payer: Self-pay | Admitting: Family Medicine

## 2022-06-11 VITALS — BP 139/80 | HR 80 | Temp 98.4°F | Ht 63.1 in | Wt 158.3 lb

## 2022-06-11 DIAGNOSIS — R202 Paresthesia of skin: Secondary | ICD-10-CM

## 2022-06-11 DIAGNOSIS — R200 Anesthesia of skin: Secondary | ICD-10-CM | POA: Diagnosis not present

## 2022-06-11 NOTE — Progress Notes (Signed)
BP 139/80   Pulse 80   Temp 98.4 F (36.9 C) (Oral)   Ht 5' 3.1" (1.603 m)   Wt 158 lb 4.8 oz (71.8 kg)   SpO2 97%   BMI 27.95 kg/m    Subjective:    Patient ID: Angie Copeland, female    DOB: Nov 05, 1950, 70 y.o.   MRN: [id number]  HPI: Angie Copeland is a 71 y.o. female  Chief Complaint  Patient presents with   Numbness    Patient is here to discuss Numbness and to discuss having a referral placed to Neurology. Patient says she woke up this morning with numbness in both of arms and wrist area says when she started to move around the numbness went away. Patient says it feels as if her joints are popping out of place. Patient says she just wants it to go away.    NUMBNESS Duration: chronic Onset: gradual Location: hands and wrists Bilateral: yes Symmetric: No, R>L Decreased sensation: yes- when they are numb  Weakness: no, but has had more of an issues with grip strength all the time, not just when they are numb Pain: yes, hitting pain that happens at random- anywhere in her body- doesn't happen in 1 particular area Quality:  numb and tingling, "hitting" pain Severity: moderate to severe  Frequency: intermittent, mostly at night, and when she still,  Trauma: no Recent illness: yes- but this has been going on for a lot longer than that Diabetes: no Thyroid disease: no  HIV: no  Alcoholism: no  Spinal cord injury: no Status: uncontrolled Treatments attempted: moving  Also noting that the balls of her feet get cold and white and can get numb, notes that her hands get very cold as well. Has more trouble with her legs going numb when she is sitting for an extended period of time or driving. She notes that her legs are also going numb when she is sleeping. She notes that she also has shooting pains ans feels like she is getting stuck when she turns to get something out of the mailbox. She notes that she will get very sore the day after she does   Relevant past medical, surgical,  family and social history reviewed and updated as indicated. Interim medical history since our last visit reviewed. Allergies and medications reviewed and updated.  Review of Systems  Constitutional: Negative.   Respiratory: Negative.    Cardiovascular: Negative.   Gastrointestinal: Negative.   Musculoskeletal: Negative.   Skin: Negative.   Neurological:  Positive for weakness and numbness. Negative for dizziness, tremors, seizures, syncope, facial asymmetry, speech difficulty, light-headedness and headaches.  Psychiatric/Behavioral: Negative.      Per HPI unless specifically indicated above     Objective:    BP 139/80   Pulse 80   Temp 98.4 F (36.9 C) (Oral)   Ht 5' 3.1" (1.603 m)   Wt 158 lb 4.8 oz (71.8 kg)   SpO2 97%   BMI 27.95 kg/m   Wt Readings from Last 3 Encounters:  06/11/22 158 lb 4.8 oz (71.8 kg)  05/17/22 152 lb 9.6 oz (69.2 kg)  02/06/21 148 lb (67.1 kg)    Physical Exam Vitals and nursing note reviewed.  Constitutional:      General: She is not in acute distress.    Appearance: Normal appearance. She is well-developed and normal weight.  HENT:     Head: Normocephalic and atraumatic.     Right Ear: Hearing and external ear normal.  Left Ear: Hearing and external ear normal.     Nose: Nose normal.     Mouth/Throat:     Mouth: Mucous membranes are moist.     Pharynx: Oropharynx is clear.  Eyes:     General: Lids are normal. No scleral icterus.       Right eye: No discharge.        Left eye: No discharge.     Conjunctiva/sclera: Conjunctivae normal.  Pulmonary:     Effort: Pulmonary effort is normal. No respiratory distress.  Musculoskeletal:     Comments: Negative Phalen's and Tinnel's at wrist and elbows.   Hypertonic trapezius muscles L>R  Skin:    Coloration: Skin is not jaundiced or pale.     Findings: No bruising, erythema, lesion or rash.  Neurological:     General: No focal deficit present.     Mental Status: She is alert and  oriented to person, place, and time. Mental status is at baseline.  Psychiatric:        Mood and Affect: Mood normal.        Speech: Speech normal.        Behavior: Behavior normal.        Thought Content: Thought content normal.        Judgment: Judgment normal.     Results for orders placed or performed in visit on 05/17/22  CBC with Differential/Platelet  Result Value Ref Range   WBC 4.8 3.4 - 10.8 x10E3/uL   RBC 4.84 3.77 - 5.28 x10E6/uL   Hemoglobin 14.9 11.1 - 15.9 g/dL   Hematocrit 44.8 34.0 - 46.6 %   MCV 93 79 - 97 fL   MCH 30.8 26.6 - 33.0 pg   MCHC 33.3 31.5 - 35.7 g/dL   RDW 12.0 11.7 - 15.4 %   Platelets 208 150 - 450 x10E3/uL   Neutrophils 47 Not Estab. %   Lymphs 43 Not Estab. %   Monocytes 8 Not Estab. %   Eos 1 Not Estab. %   Basos 1 Not Estab. %   Neutrophils Absolute 2.3 1.4 - 7.0 x10E3/uL   Lymphocytes Absolute 2.1 0.7 - 3.1 x10E3/uL   Monocytes Absolute 0.4 0.1 - 0.9 x10E3/uL   EOS (ABSOLUTE) 0.1 0.0 - 0.4 x10E3/uL   Basophils Absolute 0.0 0.0 - 0.2 x10E3/uL   Immature Granulocytes 0 Not Estab. %   Immature Grans (Abs) 0.0 0.0 - 0.1 x10E3/uL  Comprehensive metabolic panel  Result Value Ref Range   Glucose 72 70 - 99 mg/dL   BUN 16 8 - 27 mg/dL   Creatinine, Ser 0.87 0.57 - 1.00 mg/dL   eGFR 71 >59 mL/min/1.73   BUN/Creatinine Ratio 18 12 - 28   Sodium 139 134 - 144 mmol/L   Potassium 4.0 3.5 - 5.2 mmol/L   Chloride 100 96 - 106 mmol/L   CO2 22 20 - 29 mmol/L   Calcium 9.4 8.7 - 10.3 mg/dL   Total Protein 7.0 6.0 - 8.5 g/dL   Albumin 4.4 3.8 - 4.8 g/dL   Globulin, Total 2.6 1.5 - 4.5 g/dL   Albumin/Globulin Ratio 1.7 1.2 - 2.2   Bilirubin Total 0.3 0.0 - 1.2 mg/dL   Alkaline Phosphatase 57 44 - 121 IU/L   AST 20 0 - 40 IU/L   ALT 14 0 - 32 IU/L  Lipid Panel w/o Chol/HDL Ratio  Result Value Ref Range   Cholesterol, Total 257 (H) 100 - 199 mg/dL   Triglycerides 129 0 -  149 mg/dL   HDL 62 >39 mg/dL   VLDL Cholesterol Cal 23 5 - 40 mg/dL    LDL Chol Calc (NIH) 172 (H) 0 - 99 mg/dL  Urinalysis, Routine w reflex microscopic  Result Value Ref Range   Specific Gravity, UA 1.015 1.005 - 1.030   pH, UA 5.5 5.0 - 7.5   Color, UA Yellow Yellow   Appearance Ur Clear Clear   Leukocytes,UA Negative Negative   Protein,UA Negative Negative/Trace   Glucose, UA Negative Negative   Ketones, UA Trace (A) Negative   RBC, UA Negative Negative   Bilirubin, UA Negative Negative   Urobilinogen, Ur 0.2 0.2 - 1.0 mg/dL   Nitrite, UA Negative Negative   Microscopic Examination Comment   TSH  Result Value Ref Range   TSH 1.950 0.450 - 4.500 uIU/mL  VITAMIN D 25 Hydroxy (Vit-D Deficiency, Fractures)  Result Value Ref Range   Vit D, 25-Hydroxy 24.3 (L) 30.0 - 100.0 ng/mL      Assessment & Plan:   Problem List Items Addressed This Visit   None Visit Diagnoses     Paresthesias    -  Primary   Concern for radiculopathy. Will check x-rays and start PT. Discussed medications. She would like to hold on them for now. Follow up about 6 weeks.   Relevant Orders   DG Lumbar Spine Complete   DG Cervical Spine Complete   Ambulatory referral to Physical Therapy        Follow up plan: Return in about 6 weeks (around 07/23/2022).

## 2022-06-18 DIAGNOSIS — H26491 Other secondary cataract, right eye: Secondary | ICD-10-CM | POA: Diagnosis not present

## 2022-06-20 ENCOUNTER — Ambulatory Visit: Payer: Medicare PPO | Attending: Family Medicine

## 2022-06-20 ENCOUNTER — Other Ambulatory Visit: Payer: Self-pay | Admitting: Family Medicine

## 2022-06-20 DIAGNOSIS — R202 Paresthesia of skin: Secondary | ICD-10-CM | POA: Diagnosis not present

## 2022-06-20 DIAGNOSIS — M6281 Muscle weakness (generalized): Secondary | ICD-10-CM | POA: Diagnosis not present

## 2022-06-20 NOTE — Telephone Encounter (Signed)
Requested medication (s) are due for refill today: Yes  Requested medication (s) are on the active medication list: Yes  Last refill:  12/15/21  Future visit scheduled: Yes  Notes to clinic:  Prescription has expired.    Requested Prescriptions  Pending Prescriptions Disp Refills   diclofenac Sodium (VOLTAREN) 1 % GEL [Pharmacy Med Name: DICLOFENAC 1% GEL 100GM (RX)] 100 g 12    Sig: APPLY 4 GRAMS TOPICALLY TO THE AFFECTED AREA FOUR TIMES DAILY     Analgesics:  Topicals Failed - 06/20/2022  2:30 PM      Failed - Manual Review: Labs are only required if the patient has taken medication for more than 8 weeks.      Passed - PLT in normal range and within 360 days    Platelets  Date Value Ref Range Status  05/17/2022 208 150 - 450 x10E3/uL Final         Passed - HGB in normal range and within 360 days    Hemoglobin  Date Value Ref Range Status  05/17/2022 14.9 11.1 - 15.9 g/dL Final         Passed - HCT in normal range and within 360 days    Hematocrit  Date Value Ref Range Status  05/17/2022 44.8 34.0 - 46.6 % Final         Passed - Cr in normal range and within 360 days    Creatinine, Ser  Date Value Ref Range Status  05/17/2022 0.87 0.57 - 1.00 mg/dL Final         Passed - eGFR is 30 or above and within 360 days    GFR calc Af Amer  Date Value Ref Range Status  03/22/2019 82 >59 mL/min/1.73 Final   GFR calc non Af Amer  Date Value Ref Range Status  03/22/2019 71 >59 mL/min/1.73 Final   eGFR  Date Value Ref Range Status  05/17/2022 71 >59 mL/min/1.73 Final         Passed - Patient is not pregnant      Passed - Valid encounter within last 12 months    Recent Outpatient Visits           1 week ago Kelley, Megan P, DO   1 month ago Encounter for [employer] annual wellness exam   [employer], Megan P, DO   1 month ago COVID-19   [employer], Dani Gobble, PA-C   1 year ago Routine  general medical examination at a health care facility   [place], [place], DO   2 years ago Sore throat   [facility] Eulogio Bear, NP       Future Appointments             In 1 month Wynetta Emery, Barb Merino, DO [employer], [place]   In 11 months Wynetta Emery, Barb Merino, DO [employer] [facility]

## 2022-06-20 NOTE — Therapy (Signed)
OUTPATIENT PHYSICAL THERAPY NECK/UPPER EXTREMITY EVALUATION   Patient Name: Angie Copeland MRN: [id number] DOB:08-Feb-1951, 71 y.o., female Today's Date: 06/23/2022   PT End of Session - 06/23/22 1354     Visit Number 1    Number of Visits 17    Date for PT Re-Evaluation 08/15/22    Authorization Type eval: 06/20/22    PT Start Time 0930    PT Stop Time 1015    PT Time Calculation (min) 45 min    Activity Tolerance Patient tolerated treatment well    Behavior During Therapy [facility] for tasks assessed/performed            Past Medical History:  Diagnosis Date   Allergy    DDD (degenerative disc disease), lumbar    Esophageal mass 2015   Found incidently on CXR- Saw ENT and for visualization and nothing was there.    GERD (gastroesophageal reflux disease)    Hyperlipidemia    Osteoporosis    Past Surgical History:  Procedure Laterality Date   COLONOSCOPY WITH PROPOFOL N/A 02/06/2021   Procedure: COLONOSCOPY WITH PROPOFOL;  Surgeon: Virgel Manifold, MD;  Location: [facility] ENDOSCOPY;  Service: Endoscopy;  Laterality: N/A;   EYE SURGERY     cataract surgery    NOSE SURGERY     Cartilage removed from left side of nose   Patient Active Problem List   Diagnosis Date Noted   COVID-19 05/14/2022   Family history of colon cancer    Cecal polyp    Polyp of ascending colon    Vitamin D deficiency 12/19/2016   Advance directive discussed with patient 12/17/2016   Rosacea 10/15/2016   Allergic rhinitis 10/15/2016   GE reflux 10/15/2016   Hyperlipidemia, unspecified 10/15/2016   Osteoporosis 10/15/2016   Fever blister 10/15/2016   Anxiety 10/15/2016   DDD (degenerative disc disease), lumbar 01/20/2012   PCP: Valerie Roys, DO  REFERRING PROVIDER: Valerie Roys, DO  REFERRING DIAGNOSIS: Paraesthesias  THERAPY DIAG: Paresthesia of skin  Muscle weakness (generalized)  RATIONALE FOR EVALUATION AND TREATMENT: Rehabilitation  ONSET DATE: 06/20/12  (approximate)  FOLLOW UP APPT WITH PROVIDER: Yes, January 2024   SUBJECTIVE:                                                                                                                                                                                         Chief Complaint: Bilateral hand numbness  Pertinent History Pt reports that she wakes up at the night and her hands are numb. She frequently rolls and changes position while she is sleeping. When she lays on her side she puts her upper arm  down in front of her with her wrist extended on the bed and notices that this aggravates her symptoms. She also gets numbness with hard gripping on the steering wheel with driving. Pt first noticed symptoms "about a decade ago." She notices that she drops things a lot and states, "my grip is not like it used to be." She is having bilateral hand numbness occasionally in the entire hand but mostly just in digits 1-3 BUE as well as frequent hand cramps. She denies any color changes or temperature changes in her hands. She also complains of her feet "turning white on the bottom." Pt reports that she can turn her head in the shower and will feel her tongue go numb. She also complains of soreness in her R lateral upper arm.   06/11/22 Cervical Radiographs IMPRESSION: 1. 3 mm anterolisthesis of C3 versus C4. 2. Degenerative disc disease at C5-6. 3. Uncovertebral degenerative changes. 4. No other abnormalities.  06/11/22 Lumbar Radiographs IMPRESSION: 1. Mild curvature of the lumbar spine, apex to the right. Trace retrolisthesis L1 versus L2. 2. Degenerative disc disease and lower lumbar facet degenerative changes. 3. Calcified atherosclerotic changes in the abdominal aorta.  Pain:  Pain Intensity: Present: 0/10, Best: 0/10, Worst: 7/10 Pain location: Bilateral hands, digits 1-3. Also complains of R upper arm soreness Pain Quality:  soreness   Radiating: No  Numbness/Tingling: Yes, digits 1-3 both  hands Focal Weakness: Yes, bilateral hand gripping weakness Aggravating factors: sleeping, gripping steering wheel, writing Relieving factors: rest, massaging hands 24-hour pain behavior: worse overnight History of prior neck injury, pain, surgery, or therapy: No Falls: Has patient fallen in last 6 months? No Dominant hand: right Imaging: Yes, see history Prior level of function: Independent Occupational demands: Retired Surveyor, quantity: Used to [employer], reading, crafts, gardening Red flags: Denies personal history of cancer, h/o spinal tumors, history of compression fracture, chills/fever, night sweats, nausea, vomiting, unrelenting pain;  Precautions: None  Weight Bearing Restrictions: No  Living Environment Lives with: lives alone Lives in: House/apartment, two story, no recent trouble with steps  Patient Goals: Decrease hand symptoms   OBJECTIVE:   Patient Surveys  FOTO : 11, predicted improvement to 65  Cognition Patient is oriented to person, place, and time.  Recent memory is intact.  Remote memory is intact.  Attention span and concentration are intact.  Expressive speech is intact.  Patient's fund of knowledge is within normal limits for educational level.    Gross Musculoskeletal Assessment Tremor: None Bulk: Normal Tone: Normal  Gait Deferred  Posture Mild forward head and rounded shoulders, full postural assessment deferred  AROM  AROM (Normal range in degrees) AROM 06/20/2022  Cervical  Flexion (50) 60  Extension (80) 30  Right lateral flexion (45) 30  Left lateral flexion (45) 25  Right rotation (85)   Left rotation (85)   (* = pain; Blank rows = not tested)  Pain at end range shoulder flexion and abduction on the right;   MMT MMT (out of 5) Right 06/20/2022 Left 06/20/2022  Cervical (isometric)  Flexion WNL  Extension WNL  Lateral Flexion WNL WNL  Rotation WNL WNL      Shoulder   Flexion 5 5  Extension    Abduction 4* 4*   External rotation 4 4  Internal rotation 4+ 4+  Horizontal abduction    Horizontal adduction    Lower Trapezius    Rhomboids        Elbow  Flexion 4+ 4+  Extension 4  4  Pronation 4   Supination 4       Wrist  Flexion 5 5  Extension 4 4  Radial deviation    Ulnar deviation        MCP  Flexion 5 5  Extension 4 4  Abduction 5 5  Adduction 5 5  (* = pain; Blank rows = not tested)  Grip Strength:  R: 39.5#, 36.4#, 35.7# (37.2#) L: 22.7#, 25.3#, 29.9# (26.0#)  Sensation Deferred  Reflexes Deferred  Palpation Deferred  Repeated Movements Deferred  Passive Accessory Intervertebral Motion Deferred  SPECIAL TESTS Spurlings A (ipsilateral lateral flexion/axial compression): R: Negative L: Negative Spurlings B (ipsilateral lateral flexion/contralateral rotation/axial compression): R: Negative L: Negative Distraction Test: Not examined  Hoffman Sign (cervical cord compression): R: Positive L: Positive ULTT Median: R: Negative L: Negative ULTT Ulnar: R: Negative L: Negative ULTT Radial: R: Negative L: Negative  Phalen: Positive Reverse Phalen: Positive Tinel: Positive; Carpal Compression Test: Positive   TODAY'S TREATMENT  None   PATIENT EDUCATION:  Education details: Plan of care, carpal tunnel, wrist braces to be worn at night Person educated: Patient Education method: Explanation Education comprehension: verbalized understanding   HOME EXERCISE PROGRAM: None currently, advised to wear carpal tunnel wrist braces at night;   ASSESSMENT:  CLINICAL IMPRESSION: Patient is a 71 y.o. female who was seen today for physical therapy evaluation and treatment for bilateral hand numbness that occurs mostly at night. Referral indicating concern for cervical radiculopathy. Objective impairments include decreased strength and pain. Most significant findings are positive Phalen, Reverse Phalen, Tinel, and Carpal compression testing for carpal tunnel syndrome  bilaterally. Unable to reproduce symptoms with cervical screening. She also has pain with end range R shoulder flexion and abduction overpressure as well as pain with resisted R shoulder abduction. Pt appears to have findings consistent with bilateral carpal tunnel and separate R shoulder pain which will be further investigated at future sessions. Pt advised to purchase carpal tunnel braces for both sides to wear at night in order to assess weather this modifies her symptoms. She would benefit from referral for NCV testing to rule in/out diagnosis. Agree that cervical MRI would be beneficial given bilateral symptoms, positive Hoffman bilaterally, and subjective reports of tongue numbness with neck positioning. These impairments are limiting patient from meal prep, cleaning, driving, shopping, and community activity. Personal factors including Age, Past/current experiences, Time since onset of injury/illness/exacerbation, and 3+ comorbidities: anxiety, lumbar DDD, and osteoporosis  are also affecting patient's functional outcome. Patient will benefit from skilled PT to address above impairments and improve overall function.  REHAB POTENTIAL: Fair    CLINICAL DECISION MAKING: Unstable/unpredictable  EVALUATION COMPLEXITY: High   GOALS: Goals reviewed with patient? Yes  SHORT TERM GOALS: Target date: 07/18/2022  Pt will be independent with HEP to improve strength and decrease neck pain to improve pain-free function at home and work. Baseline:  Goal status: INITIAL   LONG TERM GOALS: Target date: 08/15/2022  Pt will increase FOTO to at least 66 to demonstrate significant improvement in function at home and work related to neck pain  Baseline: 06/20/22: 63 Goal status: INITIAL  2.  Pt will report at least 75% improvement in hand numbness symptoms in order to improve her function at home with less symptoms. Baseline:  Goal status: INITIAL   PLAN: PT FREQUENCY: 2x/week  PT DURATION: 8  weeks  PLANNED INTERVENTIONS: Therapeutic exercises, Therapeutic activity, Neuromuscular re-education, Balance training, Gait training, Patient/Family education, Joint manipulation, Joint mobilization, Vestibular training,  Canalith repositioning, Dry Needling, Electrical stimulation, Spinal manipulation, Spinal mobilization, Cryotherapy, Moist heat, Taping, Traction, Ultrasound, Ionotophoresis '4mg'$ /ml Dexamethasone, and Manual therapy  PLAN FOR NEXT SESSION: Cervical PAM testing, measure cervical rotation, sensation testing, R shoulder AROM measurements and special testing.  Lyndel Safe [place] Shannahan PT, DPT, GCS  Jaidyn Usery 06/23/2022, 2:14 PM

## 2022-07-02 ENCOUNTER — Ambulatory Visit: Payer: Medicare PPO

## 2022-07-02 DIAGNOSIS — M6281 Muscle weakness (generalized): Secondary | ICD-10-CM

## 2022-07-02 DIAGNOSIS — R202 Paresthesia of skin: Secondary | ICD-10-CM

## 2022-07-02 NOTE — Therapy (Unsigned)
OUTPATIENT PHYSICAL THERAPY NECK/UPPER EXTREMITY TREATMENT   Patient Name: Angie Copeland MRN: [id number] DOB:02/05/1951, 71 y.o., female Today's Date: 07/03/2022   PT End of Session - 07/02/22 1652     Visit Number 2    Number of Visits 17    Date for PT Re-Evaluation 08/15/22    Authorization Type eval: 06/20/22    PT Start Time 1655    PT Stop Time 1740    PT Time Calculation (min) 45 min    Activity Tolerance Patient tolerated treatment well    Behavior During Therapy [facility] for tasks assessed/performed            Past Medical History:  Diagnosis Date   Allergy    DDD (degenerative disc disease), lumbar    Esophageal mass 2015   Found incidently on CXR- Saw ENT and for visualization and nothing was there.    GERD (gastroesophageal reflux disease)    Hyperlipidemia    Osteoporosis    Past Surgical History:  Procedure Laterality Date   COLONOSCOPY WITH PROPOFOL N/A 02/06/2021   Procedure: COLONOSCOPY WITH PROPOFOL;  Surgeon: Virgel Manifold, MD;  Location: [facility] ENDOSCOPY;  Service: Endoscopy;  Laterality: N/A;   EYE SURGERY     cataract surgery    NOSE SURGERY     Cartilage removed from left side of nose   Patient Active Problem List   Diagnosis Date Noted   COVID-19 05/14/2022   Family history of colon cancer    Cecal polyp    Polyp of ascending colon    Vitamin D deficiency 12/19/2016   Advance directive discussed with patient 12/17/2016   Rosacea 10/15/2016   Allergic rhinitis 10/15/2016   GE reflux 10/15/2016   Hyperlipidemia, unspecified 10/15/2016   Osteoporosis 10/15/2016   Fever blister 10/15/2016   Anxiety 10/15/2016   DDD (degenerative disc disease), lumbar 01/20/2012   PCP: Valerie Roys, DO  REFERRING PROVIDER: Valerie Roys, DO  REFERRING DIAGNOSIS: Paraesthesias  THERAPY DIAG: Paresthesia of skin  Muscle weakness (generalized)  RATIONALE FOR EVALUATION AND TREATMENT: Rehabilitation  ONSET DATE: 06/20/12  (approximate)  FOLLOW UP APPT WITH PROVIDER: Yes, January 2024  FROM INITIAL EVALUATION SUBJECTIVE:                                                                                                                                                                                         Chief Complaint: Bilateral hand numbness  Pertinent History Pt reports that she wakes up at the night and her hands are numb. She frequently rolls and changes position while she is sleeping. When she lays on her side she puts her  upper arm down in front of her with her wrist extended on the bed and notices that this aggravates her symptoms. She also gets numbness with hard gripping on the steering wheel with driving. Pt first noticed symptoms "about a decade ago." She notices that she drops things a lot and states, "my grip is not like it used to be." She is having bilateral hand numbness occasionally in the entire hand but mostly just in digits 1-3 BUE as well as frequent hand cramps. She denies any color changes or temperature changes in her hands. She also complains of her feet "turning white on the bottom." Pt reports that she can turn her head in the shower and will feel her tongue go numb. She also complains of soreness in her R lateral upper arm.   06/11/22 Cervical Radiographs IMPRESSION: 1. 3 mm anterolisthesis of C3 versus C4. 2. Degenerative disc disease at C5-6. 3. Uncovertebral degenerative changes. 4. No other abnormalities.  06/11/22 Lumbar Radiographs IMPRESSION: 1. Mild curvature of the lumbar spine, apex to the right. Trace retrolisthesis L1 versus L2. 2. Degenerative disc disease and lower lumbar facet degenerative changes. 3. Calcified atherosclerotic changes in the abdominal aorta.  Pain:  Pain Intensity: Present: 0/10, Best: 0/10, Worst: 7/10 Pain location: Bilateral hands, digits 1-3. Also complains of R upper arm soreness Pain Quality:  soreness   Radiating: No  Numbness/Tingling:  Yes, digits 1-3 both hands Focal Weakness: Yes, bilateral hand gripping weakness Aggravating factors: sleeping, gripping steering wheel, writing Relieving factors: rest, massaging hands 24-hour pain behavior: worse overnight History of prior neck injury, pain, surgery, or therapy: No Falls: Has patient fallen in last 6 months? 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  Recent memory is intact.  Remote memory is intact.  Attention span and concentration are intact.  Expressive speech is intact.  Patient's fund of knowledge is within normal limits for educational level.    Gross Musculoskeletal Assessment Tremor: None Bulk: Normal Tone: Normal  Gait Deferred  Posture Mild forward head and rounded shoulders, full postural assessment deferred  AROM  AROM (Normal range in degrees) AROM 06/20/2022  Cervical  Flexion (50) 60  Extension (80) 30  Right lateral flexion (45) 30  Left lateral flexion (45) 25  Right rotation (85) 74  Left rotation (85) 71  (* = pain; Blank rows = not tested)  Pain at end range shoulder flexion and abduction on the right;   MMT MMT (out of 5) Right 06/20/2022 Left 06/20/2022  Cervical (isometric)  Flexion WNL  Extension WNL  Lateral Flexion WNL WNL  Rotation WNL WNL      Shoulder   Flexion 5 5  Extension     Abduction 4* 4*  External rotation 4 4  Internal rotation 4+ 4+  Horizontal abduction    Horizontal adduction    Lower Trapezius    Rhomboids        Elbow  Flexion 4+ 4+  Extension 4 4  Pronation 4   Supination 4       Wrist  Flexion 5 5  Extension 4 4  Radial deviation    Ulnar deviation        MCP  Flexion 5 5  Extension 4 4  Abduction 5 5  Adduction 5 5  (* = pain; Blank rows = not tested)  Grip Strength:  R: 39.5#, 36.4#, 35.7# (37.2#) L: 22.7#, 25.3#, 29.9# (26.0#)  Sensation Deferred  Reflexes Deferred  Palpation Deferred  Repeated Movements Deferred  Passive Accessory Intervertebral Motion Deferred  SPECIAL TESTS Spurlings A (ipsilateral lateral flexion/axial compression): R: Negative L: Negative Spurlings B (ipsilateral lateral flexion/contralateral rotation/axial compression): R: Negative L: Negative Distraction Test: Not examined  Hoffman Sign (cervical cord compression): R: Positive L: Positive ULTT Median: R: Negative L: Negative ULTT Ulnar: R: Negative L: Negative ULTT Radial: R: Negative L: Negative  Phalen: Positive Reverse Phalen: Positive Tinel: Positive; Carpal Compression Test: Positive   TODAY'S TREATMENT    SUBJECTIVE: Pt reports that she has noticed improvement in her carpal tunnel symptoms since wearing her braces at night. Her R shoulder has been hurting intermittently but is not painful currently.   PAIN: None currently  Ther-ex  Measured cervical ROM rotation: R: 74 L: 71   AROM  AROM (Normal range in degrees) AROM   Right Left  Shoulder    Flexion 174 168  Extension    Abduction 169 171  External Rotation 81 81  Internal Rotation 68 69  Hands Behind Head    Hands Behind Back        Elbow    Flexion WNL WNL  Extension WNL WNL  Pronation    Supination    (* = pain; Blank rows = not tested)  SPECIAL TESTS  Rotator Cuff  Drop Arm Test: Negative Painful Arc (Pain from 60 to 120 degrees scaption):  Negative Infraspinatus Muscle Test: Negative If all 3 tests positive, the probability of a full-thickness rotator cuff tear is 91%  Subacromial Impingement Hawkins-Kennedy: Negative Neer (Block scapula, PROM flexion): Negative Painful Arc (Pain from 60 to 120 degrees scaption): Negative Empty Can: Negative External Rotation Resistance: Negative Horizontal Adduction: Negative Scapular Assist: Not examined  Labral Tear Biceps Load II (120 elevation, full ER, 90 elbow flexion, full supination, resisted elbow flexion): Negative Crank (160 scaption, axial load with IR/ER): Negative Active Compression Test: Negative  Bicep Tendon Pathology Speed (shoulder flexion to 90, external rotation, full elbow extension, and forearm supination with resistance: Positive Yergason's (resisted shoulder ER and supination/biceps tendon pathology): Negative  Shoulder Instability Sulcus Sign: Negative Anterior Apprehension: Negative  Cervical passive accessory mobility testing: tender and hypomobile throughout but no reproduction of R shoudler pain;  Seated bilateral shoulder flexion with 2# dumbbells (DB) 2 x 10 BUE; Seated bilateral shoulder scaption with 2# DB 2 x 10 BUE; Seated bilateral shoulder "W's" with red tband 2 x 10; Standing rows with red tband 2 x 10; HEP issued and reviewed with patient;   PATIENT EDUCATION:  Education details: Plan of care, carpal tunnel, wrist braces to be worn at night Person educated: Patient Education method: Explanation Education comprehension: verbalized understanding   HOME EXERCISE PROGRAM: Access Code: [email or web address] URL: [email or web address] Date: 07/02/2022 Prepared by: Roxana Hires  Exercises - Shoulder W - External Rotation with Resistance  - 1 x daily - 7 x weekly - 2 sets - 10 reps - 2s hold - Seated Shoulder Flexion with Dumbbells  - 1 x daily -  7 x weekly - 2 sets - 10 reps - 2s hold - Seated Bilateral Shoulder Scaption with  Dumbbells  - 1 x daily - 7 x weekly - 2 sets - 10 reps - 2s hold - Standing Shoulder Row with Anchored Resistance  - 1 x daily - 7 x weekly - 2 sets - 10 reps - 2s hold  Pt advised to wear carpal tunnel wrist braces at night;   ASSESSMENT:  CLINICAL IMPRESSION: R shoulder range of motion is grossly WNL with slight limitation in external rotation. Special testing is relatively benign however she does have slight pain with resisted R shoulder abduction. Mild weakness noted in R shoulder so initiated pain-free strengthening. Pt provided HEP for strengthening and encouraged to continue wearing wrist braces at night for bilateral CTS. Plan ot progress R upper quarter strengthening at future sessions. She will benefit from skilled PT to address above impairments and improve overall function with less pain.  REHAB POTENTIAL: Fair    CLINICAL DECISION MAKING: Unstable/unpredictable  EVALUATION COMPLEXITY: High   GOALS: Goals reviewed with patient? Yes  SHORT TERM GOALS: Target date: 07/18/2022  Pt will be independent with HEP to improve strength and decrease neck pain to improve pain-free function at home and work. Baseline:  Goal status: INITIAL   LONG TERM GOALS: Target date: 08/15/2022  Pt will increase FOTO to at least 66 to demonstrate significant improvement in function at home and work related to neck pain  Baseline: 06/20/22: 63 Goal status: INITIAL  2.  Pt will report at least 75% improvement in hand numbness symptoms in order to improve her function at home with less symptoms. Baseline:  Goal status: INITIAL   PLAN: PT FREQUENCY: 2x/week  PT DURATION: 8 weeks  PLANNED INTERVENTIONS: Therapeutic exercises, Therapeutic activity, Neuromuscular re-education, Balance training, Gait training, Patient/Family education, Joint manipulation, Joint mobilization, Vestibular training, Canalith repositioning, Dry Needling, Electrical stimulation, Spinal manipulation, Spinal mobilization,  Cryotherapy, Moist heat, Taping, Traction, Ultrasound, Ionotophoresis '4mg'$ /ml Dexamethasone, and Manual therapy  PLAN FOR NEXT SESSION: Progressive R upper quarter strengthening;  Lyndel Safe Emily Massar PT, DPT, GCS  Patric Buckhalter 07/03/2022, 11:07 PM

## 2022-07-16 ENCOUNTER — Ambulatory Visit: Payer: Medicare PPO

## 2022-07-21 NOTE — Therapy (Signed)
OUTPATIENT PHYSICAL THERAPY NECK/UPPER EXTREMITY TREATMENT   Patient Name: Angie Copeland MRN: [id number] DOB:05/16/1951, 72 y.o., female Today's Date: 07/22/2022   PT End of Session - 07/22/22 0935     Visit Number 3    Number of Visits 17    Date for PT Re-Evaluation 08/15/22    Authorization Type eval: 06/20/22    PT Start Time 0935    PT Stop Time 1015    PT Time Calculation (min) 40 min    Activity Tolerance Patient tolerated treatment well    Behavior During Therapy [facility] for tasks assessed/performed            Past Medical History:  Diagnosis Date   Allergy    DDD (degenerative disc disease), lumbar    Esophageal mass 2015   Found incidently on CXR- Saw ENT and for visualization and nothing was there.    GERD (gastroesophageal reflux disease)    Hyperlipidemia    Osteoporosis    Past Surgical History:  Procedure Laterality Date   COLONOSCOPY WITH PROPOFOL N/A 02/06/2021   Procedure: COLONOSCOPY WITH PROPOFOL;  Surgeon: Virgel Manifold, MD;  Location: [facility] ENDOSCOPY;  Service: Endoscopy;  Laterality: N/A;   EYE SURGERY     cataract surgery    NOSE SURGERY     Cartilage removed from left side of nose   Patient Active Problem List   Diagnosis Date Noted   COVID-19 05/14/2022   Family history of colon cancer    Cecal polyp    Polyp of ascending colon    Vitamin D deficiency 12/19/2016   Advance directive discussed with patient 12/17/2016   Rosacea 10/15/2016   Allergic rhinitis 10/15/2016   GE reflux 10/15/2016   Hyperlipidemia, unspecified 10/15/2016   Osteoporosis 10/15/2016   Fever blister 10/15/2016   Anxiety 10/15/2016   DDD (degenerative disc disease), lumbar 01/20/2012   PCP: Valerie Roys, DO  REFERRING PROVIDER: Valerie Roys, DO  REFERRING DIAGNOSIS: Paraesthesias  THERAPY DIAG: Muscle weakness (generalized)  Chronic right shoulder pain  RATIONALE FOR EVALUATION AND TREATMENT: Rehabilitation  ONSET DATE: 06/20/12  (approximate)  FOLLOW UP APPT WITH PROVIDER: Yes, January 2024  FROM INITIAL EVALUATION SUBJECTIVE:                                                                                                                                                                                         Chief Complaint: Bilateral hand numbness  Pertinent History Pt reports that she wakes up at the night and her hands are numb. She frequently rolls and changes position while she is sleeping. When she lays on her side she puts  her upper arm down in front of her with her wrist extended on the bed and notices that this aggravates her symptoms. She also gets numbness with hard gripping on the steering wheel with driving. Pt first noticed symptoms "about a decade ago." She notices that she drops things a lot and states, "my grip is not like it used to be." She is having bilateral hand numbness occasionally in the entire hand but mostly just in digits 1-3 BUE as well as frequent hand cramps. She denies any color changes or temperature changes in her hands. She also complains of her feet "turning white on the bottom." Pt reports that she can turn her head in the shower and will feel her tongue go numb. She also complains of soreness in her R lateral upper arm.   06/11/22 Cervical Radiographs IMPRESSION: 1. 3 mm anterolisthesis of C3 versus C4. 2. Degenerative disc disease at C5-6. 3. Uncovertebral degenerative changes. 4. No other abnormalities.  06/11/22 Lumbar Radiographs IMPRESSION: 1. Mild curvature of the lumbar spine, apex to the right. Trace retrolisthesis L1 versus L2. 2. Degenerative disc disease and lower lumbar facet degenerative changes. 3. Calcified atherosclerotic changes in the abdominal aorta.  Pain:  Pain Intensity: Present: 0/10, Best: 0/10, Worst: 7/10 Pain location: Bilateral hands, digits 1-3. Also complains of R upper arm soreness Pain Quality:  soreness   Radiating: No  Numbness/Tingling:  Yes, digits 1-3 both hands Focal Weakness: Yes, bilateral hand gripping weakness Aggravating factors: sleeping, gripping steering wheel, writing Relieving factors: rest, massaging hands 24-hour pain behavior: worse overnight History of prior neck injury, pain, surgery, or therapy: No Falls: Has patient fallen in last 6 months? 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  Recent memory is intact.  Remote memory is intact.  Attention span and concentration are intact.  Expressive speech is intact.  Patient's fund of knowledge is within normal limits for educational level.    Gross Musculoskeletal Assessment Tremor: None Bulk: Normal Tone: Normal  Gait Deferred  Posture Mild forward head and rounded shoulders, full postural assessment deferred  AROM  AROM (Normal range in degrees) AROM 06/20/2022  Cervical  Flexion (50) 60  Extension (80) 30  Right lateral flexion (45) 30  Left lateral flexion (45) 25  Right rotation (85) 74  Left rotation (85) 71  (* = pain; Blank rows = not tested)  Pain at end range shoulder flexion and abduction on the right;  AROM  AROM (Normal range in degrees) AROM   Right Left  Shoulder    Flexion 174 168  Extension    Abduction 169 171  External Rotation 81 81  Internal Rotation 68 69  Hands Behind Head     Hands Behind Back        Elbow    Flexion WNL WNL  Extension WNL WNL  Pronation    Supination    (* = pain; Blank rows = not tested)   MMT  MMT (out of 5) Right 06/20/2022 Left 06/20/2022  Cervical (isometric)  Flexion WNL  Extension WNL  Lateral Flexion WNL WNL  Rotation WNL WNL      Shoulder   Flexion 5 5  Extension    Abduction 4* 4*  External rotation 4 4  Internal rotation 4+ 4+  Horizontal abduction    Horizontal adduction    Lower Trapezius    Rhomboids        Elbow  Flexion 4+ 4+  Extension 4 4  Pronation 4   Supination 4       Wrist  Flexion 5 5  Extension 4 4  Radial deviation    Ulnar deviation        MCP  Flexion 5 5  Extension 4 4  Abduction 5 5  Adduction 5 5  (* = pain; Blank rows = not tested)  Grip Strength:  R: 39.5#, 36.4#, 35.7# (37.2#) L: 22.7#, 25.3#, 29.9# (26.0#)  Sensation Deferred  Reflexes Deferred  Palpation Deferred  Repeated Movements Deferred  Passive Accessory Intervertebral Motion Cervical passive accessory mobility testing: tender and hypomobile throughout but no reproduction of R shoulder pain;  SPECIAL TESTS Spurlings A (ipsilateral lateral flexion/axial compression): R: Negative L: Negative Spurlings B (ipsilateral lateral flexion/contralateral rotation/axial compression): R: Negative L: Negative Distraction Test: Not examined  Hoffman Sign (cervical cord compression): R: Positive L: Positive ULTT Median: R: Negative L: Negative ULTT Ulnar: R: Negative L: Negative ULTT Radial: R: Negative L: Negative  Phalen: Positive Reverse Phalen: Positive Tinel: Positive; Carpal Compression Test: Positive  Rotator Cuff  Drop Arm Test: Negative Painful Arc (Pain from 60 to 120 degrees scaption): Negative Infraspinatus Muscle Test: Negative If all 3 tests positive, the probability of a full-thickness rotator cuff tear is 91%  Subacromial Impingement Hawkins-Kennedy: Negative Neer (Block scapula, PROM  flexion): Negative Painful Arc (Pain from 60 to 120 degrees scaption): Negative Empty Can: Negative External Rotation Resistance: Negative Horizontal Adduction: Negative Scapular Assist: Not examined  Labral Tear Biceps Load II (120 elevation, full ER, 90 elbow flexion, full supination, resisted elbow flexion): Negative Crank (160 scaption, axial load with IR/ER): Negative Active Compression Test: Negative  Bicep Tendon Pathology Speed (shoulder flexion to 90, external rotation, full elbow extension, and forearm supination with resistance: Positive Yergason's (resisted shoulder ER and supination/biceps tendon pathology): Negative  Shoulder Instability Sulcus Sign: Negative Anterior Apprehension: Negative   TODAY'S TREATMENT    SUBJECTIVE: Pt reports that she has continued to notice improvement in her carpal tunnel symptoms since wearing her wrist braces at night. If she doesn't wear her braces she notices worsening numbness in her hands during the day. Her R shoulder has been hurting worse since the end of December when she moved a concrete block off her well.   PAIN: None currently but she had some mild R shoulder pain when driving to her appointment   Ther-ex  UBE x 4 minutes for warm-up and UE strengthening (2 forward/2 backward); Seated Nautilus lat pull down 60# x 10, 70# x 10; Seated Nautilus row 30# x 10, 40# x 15; Standing Nautilus shoulder extension 40# x 10; Seated bilateral shoulder flexion with 2# dumbbells (DB) 2 x 10 BUE; Seated bicep curls with 2# DB x 10, 4# x 10 BUE; Seated bilateral shoulder scaption with 2# DB 2 x 10 BUE; Seated bilateral shoulder "W's" with green tband 2 x 10; Cold pack applied to R shoulder during HEP review;   PATIENT EDUCATION:  Education details: Pt educated throughout  session about proper posture and technique with exercises. Improved exercise technique, movement at target joints, use of target muscles after min to mod verbal,  visual, tactile cues.  Person educated: Patient Education method: Explanation Education comprehension: verbalized understanding   HOME EXERCISE PROGRAM: Access Code: [email or web address] URL: [email or web address] Date: 07/02/2022 Prepared by: Roxana Hires  Exercises - Shoulder W - External Rotation with Resistance  - 1 x daily - 7 x weekly - 2 sets - 10 reps - 2s hold - Seated Shoulder Flexion with Dumbbells  - 1 x daily - 7 x weekly - 2 sets - 10 reps - 2s hold - Seated Bilateral Shoulder Scaption with Dumbbells  - 1 x daily - 7 x weekly - 2 sets - 10 reps - 2s hold - Standing Shoulder Row with Anchored Resistance  - 1 x daily - 7 x weekly - 2 sets - 10 reps - 2s hold  Pt advised to wear carpal tunnel wrist braces at night;   ASSESSMENT:  CLINICAL IMPRESSION: Progressed pain-free/pain-limited R shoulder strengthening during session today. She is able to complete all exercises as instructed and is able to progress resistance. Reinforced HEP for strengthening and encouraged pt to continue wearing wrist braces at night for bilateral CTS. Plan ot progress R upper quarter strengthening at future sessions. Will consider updating HEP at next session. She will benefit from skilled PT to address above impairments and improve overall function with less pain.  REHAB POTENTIAL: Fair    CLINICAL DECISION MAKING: Unstable/unpredictable  EVALUATION COMPLEXITY: High   GOALS: Goals reviewed with patient? Yes  SHORT TERM GOALS: Target date: 07/18/2022  Pt will be independent with HEP to improve strength and decrease neck pain to improve pain-free function at home and work. Baseline:  Goal status: INITIAL   LONG TERM GOALS: Target date: 08/15/2022  Pt will increase FOTO to at least 66 to demonstrate significant improvement in function at home and work related to neck pain  Baseline: 06/20/22: 63 Goal status: INITIAL  2.  Pt will report at least 75% improvement in hand numbness  symptoms in order to improve her function at home with less symptoms. Baseline:  Goal status: INITIAL   PLAN: PT FREQUENCY: 2x/week  PT DURATION: 8 weeks  PLANNED INTERVENTIONS: Therapeutic exercises, Therapeutic activity, Neuromuscular re-education, Balance training, Gait training, Patient/Family education, Joint manipulation, Joint mobilization, Vestibular training, Canalith repositioning, Dry Needling, Electrical stimulation, Spinal manipulation, Spinal mobilization, Cryotherapy, Moist heat, Taping, Traction, Ultrasound, Ionotophoresis '4mg'$ /ml Dexamethasone, and Manual therapy  PLAN FOR NEXT SESSION: Progressive R upper quarter strengthening;  Kanyla Omeara D Dujuan Stankowski PT, DPT, GCS  Zamiah Tollett 07/22/2022, 10:20 AM

## 2022-07-22 ENCOUNTER — Ambulatory Visit: Payer: Medicare PPO | Attending: Family Medicine

## 2022-07-22 DIAGNOSIS — M25511 Pain in right shoulder: Secondary | ICD-10-CM | POA: Insufficient documentation

## 2022-07-22 DIAGNOSIS — G8929 Other chronic pain: Secondary | ICD-10-CM | POA: Diagnosis not present

## 2022-07-22 DIAGNOSIS — M6281 Muscle weakness (generalized): Secondary | ICD-10-CM | POA: Insufficient documentation

## 2022-07-23 ENCOUNTER — Ambulatory Visit: Payer: Medicare PPO | Admitting: Family Medicine

## 2022-07-23 DIAGNOSIS — H26491 Other secondary cataract, right eye: Secondary | ICD-10-CM | POA: Diagnosis not present

## 2022-07-26 NOTE — Therapy (Signed)
OUTPATIENT PHYSICAL THERAPY NECK/UPPER EXTREMITY TREATMENT   Patient Name: Angie Copeland MRN: [id number] DOB:1951-05-07, 72 y.o., female Today's Date: 07/30/2022   PT End of Session - 07/30/22 1021     Visit Number 4    Number of Visits 17    Date for PT Re-Evaluation 08/15/22    Authorization Type eval: 06/20/22    PT Start Time 1021    PT Stop Time 1100    PT Time Calculation (min) 39 min    Activity Tolerance Patient tolerated treatment well    Behavior During Therapy WFL for tasks assessed/performed            Past Medical History:  Diagnosis Date   Allergy    DDD (degenerative disc disease), lumbar    Esophageal mass 2015   Found incidently on CXR- Saw ENT and for visualization and nothing was there.    GERD (gastroesophageal reflux disease)    Hyperlipidemia    Osteoporosis    Past Surgical History:  Procedure Laterality Date   COLONOSCOPY WITH PROPOFOL N/A 02/06/2021   Procedure: COLONOSCOPY WITH PROPOFOL;  Surgeon: Virgel Manifold, MD;  Location: [facility] ENDOSCOPY;  Service: Endoscopy;  Laterality: N/A;   EYE SURGERY     cataract surgery    NOSE SURGERY     Cartilage removed from left side of nose   Patient Active Problem List   Diagnosis Date Noted   COVID-19 05/14/2022   Family history of colon cancer    Cecal polyp    Polyp of ascending colon    Vitamin D deficiency 12/19/2016   Advance directive discussed with patient 12/17/2016   Rosacea 10/15/2016   Allergic rhinitis 10/15/2016   GE reflux 10/15/2016   Hyperlipidemia, unspecified 10/15/2016   Osteoporosis 10/15/2016   Fever blister 10/15/2016   Anxiety 10/15/2016   DDD (degenerative disc disease), lumbar 01/20/2012   PCP: Valerie Roys, DO  REFERRING PROVIDER: Valerie Roys, DO  REFERRING DIAGNOSIS: Paraesthesias  THERAPY DIAG: Muscle weakness (generalized)  Chronic right shoulder pain  RATIONALE FOR EVALUATION AND TREATMENT: Rehabilitation  ONSET DATE: 06/20/12  (approximate)  FOLLOW UP APPT WITH PROVIDER: Yes, January 2024  FROM INITIAL EVALUATION SUBJECTIVE:                                                                                                                                                                                         Chief Complaint: Bilateral hand numbness  Pertinent History Pt reports that she wakes up at the night and her hands are numb. She frequently rolls and changes position while she is sleeping. When she lays on her side she puts  her upper arm down in front of her with her wrist extended on the bed and notices that this aggravates her symptoms. She also gets numbness with hard gripping on the steering wheel with driving. Pt first noticed symptoms "about a decade ago." She notices that she drops things a lot and states, "my grip is not like it used to be." She is having bilateral hand numbness occasionally in the entire hand but mostly just in digits 1-3 BUE as well as frequent hand cramps. She denies any color changes or temperature changes in her hands. She also complains of her feet "turning white on the bottom." Pt reports that she can turn her head in the shower and will feel her tongue go numb. She also complains of soreness in her R lateral upper arm.   06/11/22 Cervical Radiographs IMPRESSION: 1. 3 mm anterolisthesis of C3 versus C4. 2. Degenerative disc disease at C5-6. 3. Uncovertebral degenerative changes. 4. No other abnormalities.  06/11/22 Lumbar Radiographs IMPRESSION: 1. Mild curvature of the lumbar spine, apex to the right. Trace retrolisthesis L1 versus L2. 2. Degenerative disc disease and lower lumbar facet degenerative changes. 3. Calcified atherosclerotic changes in the abdominal aorta.  Pain:  Pain Intensity: Present: 0/10, Best: 0/10, Worst: 7/10 Pain location: Bilateral hands, digits 1-3. Also complains of R upper arm soreness Pain Quality:  soreness   Radiating: No  Numbness/Tingling:  Yes, digits 1-3 both hands Focal Weakness: Yes, bilateral hand gripping weakness Aggravating factors: sleeping, gripping steering wheel, writing Relieving factors: rest, massaging hands 24-hour pain behavior: worse overnight History of prior neck injury, pain, surgery, or therapy: No Falls: Has patient fallen in last 6 months? No Dominant hand: right Imaging: Yes, see history Prior level of function: Independent Occupational demands: Retired Surveyor, quantity: Used to [employer], reading, crafts, gardening Red flags: Denies personal history of cancer, h/o spinal tumors, history of compression fracture, chills/fever, night sweats, nausea, vomiting, unrelenting pain;  Precautions: None  Weight Bearing Restrictions: No  Living Environment Lives with: lives alone Lives in: House/apartment, two story, no recent trouble with steps  Patient Goals: Decrease hand symptoms   OBJECTIVE:   Patient Surveys  FOTO : 66, predicted improvement to 43  Cognition Patient is oriented to person, place, and time.  Recent memory is intact.  Remote memory is intact.  Attention span and concentration are intact.  Expressive speech is intact.  Patient's fund of knowledge is within normal limits for educational level.    Gross Musculoskeletal Assessment Tremor: None Bulk: Normal Tone: Normal  Gait Deferred  Posture Mild forward head and rounded shoulders, full postural assessment deferred  AROM  AROM (Normal range in degrees) AROM 06/20/2022  Cervical  Flexion (50) 60  Extension (80) 30  Right lateral flexion (45) 30  Left lateral flexion (45) 25  Right rotation (85) 74  Left rotation (85) 71  (* = pain; Blank rows = not tested)  Pain at end range shoulder flexion and abduction on the right;  AROM  AROM (Normal range in degrees) AROM   Right Left  Shoulder    Flexion 174 168  Extension    Abduction 169 171  External Rotation 81 81  Internal Rotation 68 69  Hands Behind Head     Hands Behind Back        Elbow    Flexion WNL WNL  Extension WNL WNL  Pronation    Supination    (* = pain; Blank rows = not tested)   MMT  MMT (out of 5) Right 06/20/2022 Left 06/20/2022  Cervical (isometric)  Flexion WNL  Extension WNL  Lateral Flexion WNL WNL  Rotation WNL WNL      Shoulder   Flexion 5 5  Extension    Abduction 4* 4*  External rotation 4 4  Internal rotation 4+ 4+  Horizontal abduction    Horizontal adduction    Lower Trapezius    Rhomboids        Elbow  Flexion 4+ 4+  Extension 4 4  Pronation 4   Supination 4       Wrist  Flexion 5 5  Extension 4 4  Radial deviation    Ulnar deviation        MCP  Flexion 5 5  Extension 4 4  Abduction 5 5  Adduction 5 5  (* = pain; Blank rows = not tested)  Grip Strength:  R: 39.5#, 36.4#, 35.7# (37.2#) L: 22.7#, 25.3#, 29.9# (26.0#)  Sensation Deferred  Reflexes Deferred  Palpation Deferred  Repeated Movements Deferred  Passive Accessory Intervertebral Motion Cervical passive accessory mobility testing: tender and hypomobile throughout but no reproduction of R shoulder pain;  SPECIAL TESTS Spurlings A (ipsilateral lateral flexion/axial compression): R: Negative L: Negative Spurlings B (ipsilateral lateral flexion/contralateral rotation/axial compression): R: Negative L: Negative Distraction Test: Not examined  Hoffman Sign (cervical cord compression): R: Positive L: Positive ULTT Median: R: Negative L: Negative ULTT Ulnar: R: Negative L: Negative ULTT Radial: R: Negative L: Negative  Phalen: Positive Reverse Phalen: Positive Tinel: Positive; Carpal Compression Test: Positive  Rotator Cuff  Drop Arm Test: Negative Painful Arc (Pain from 60 to 120 degrees scaption): Negative Infraspinatus Muscle Test: Negative If all 3 tests positive, the probability of a full-thickness rotator cuff tear is 91%  Subacromial Impingement Hawkins-Kennedy: Negative Neer (Block scapula, PROM  flexion): Negative Painful Arc (Pain from 60 to 120 degrees scaption): Negative Empty Can: Negative External Rotation Resistance: Negative Horizontal Adduction: Negative Scapular Assist: Not examined  Labral Tear Biceps Load II (120 elevation, full ER, 90 elbow flexion, full supination, resisted elbow flexion): Negative Crank (160 scaption, axial load with IR/ER): Negative Active Compression Test: Negative  Bicep Tendon Pathology Speed (shoulder flexion to 90, external rotation, full elbow extension, and forearm supination with resistance: Positive Yergason's (resisted shoulder ER and supination/biceps tendon pathology): Negative  Shoulder Instability Sulcus Sign: Negative Anterior Apprehension: Negative   TODAY'S TREATMENT    SUBJECTIVE: Pt reports that she was very sore for multiple days after the last therapy session. She has continued to notice improvement in her carpal tunnel symptoms since wearing her wrist braces at night. If she doesn't wear her braces she notices worsening numbness in her hands during the day.    PAIN: None currently   Ther-ex  UBE x 4 minutes for warm-up and UE strengthening (2 forward/2 backward); Supine bilateral shoulder flexion with 2# dumbbells (DB) 2 x 10 BUE; Supine serraturs punch with 2# DB 2 x 10 BUE; Supine R shoulder cirlces with 2# DB CW/CCW 2 x 10 each direction; Supine pvc chest press with manual resistance 2 x 10; Supine bicep curls with 2# DB 2 x 10 BUE; Supine R tricep extension with manual resistance 2 x 10; L sidelying R shoulder abduction with 2# DB x 10, x 8; L sidelying R shoulder ER with 2# DB 2 x 10; Cold pack applied to R shoulder during HEP review;   Not performed: Seated Nautilus lat pull down 60# x 10, 70# x 10;  Seated Nautilus row 30# x 10, 40# x 15; Standing Nautilus shoulder extension 40# x 10; Seated bilateral shoulder flexion with 2# dumbbells (DB) 2 x 10 BUE; Seated bicep curls with 2# DB x 10, 4# x 10  BUE; Seated bilateral shoulder scaption with 2# DB 2 x 10 BUE; Seated bilateral shoulder "W's" with green tband 2 x 10;    PATIENT EDUCATION:  Education details: Pt educated throughout session about proper posture and technique with exercises. Improved exercise technique, movement at target joints, use of target muscles after min to mod verbal, visual, tactile cues.  Person educated: Patient Education method: Explanation Education comprehension: verbalized understanding   HOME EXERCISE PROGRAM: Access Code: [email or web address] URL: [email or web address] Date: 07/02/2022 Prepared by: Roxana Hires  Exercises - Shoulder W - External Rotation with Resistance  - 1 x daily - 7 x weekly - 2 sets - 10 reps - 2s hold - Seated Shoulder Flexion with Dumbbells  - 1 x daily - 7 x weekly - 2 sets - 10 reps - 2s hold - Seated Bilateral Shoulder Scaption with Dumbbells  - 1 x daily - 7 x weekly - 2 sets - 10 reps - 2s hold - Standing Shoulder Row with Anchored Resistance  - 1 x daily - 7 x weekly - 2 sets - 10 reps - 2s hold  Pt advised to wear carpal tunnel wrist braces at night;   ASSESSMENT:  CLINICAL IMPRESSION: Progressed pain-free/pain-limited R shoulder strengthening during session today. Regressed to supine and sidelying exercises due to reports of excessive soreness after the last therapy session. She is able to complete all exercises as instructed today and only report mild pain during supine shoulder flexion. Reinforced HEP for strengthening and encouraged pt to continue wearing wrist braces at night for bilateral CTS. Plan ot progress R upper quarter strengthening at future sessions. Will consider updating HEP at next session if appropriate. She will benefit from skilled PT to address above impairments and improve overall function with less pain.  REHAB POTENTIAL: Fair    CLINICAL DECISION MAKING: Unstable/unpredictable  EVALUATION COMPLEXITY: High   GOALS: Goals reviewed  with patient? Yes  SHORT TERM GOALS: Target date: 07/18/2022  Pt will be independent with HEP to improve strength and decrease neck pain to improve pain-free function at home and work. Baseline:  Goal status: INITIAL   LONG TERM GOALS: Target date: 08/15/2022  Pt will increase FOTO to at least 66 to demonstrate significant improvement in function at home and work related to neck pain  Baseline: 06/20/22: 63 Goal status: INITIAL  2.  Pt will report at least 75% improvement in hand numbness symptoms in order to improve her function at home with less symptoms. Baseline:  Goal status: INITIAL   PLAN: PT FREQUENCY: 2x/week  PT DURATION: 8 weeks  PLANNED INTERVENTIONS: Therapeutic exercises, Therapeutic activity, Neuromuscular re-education, Balance training, Gait training, Patient/Family education, Joint manipulation, Joint mobilization, Vestibular training, Canalith repositioning, Dry Needling, Electrical stimulation, Spinal manipulation, Spinal mobilization, Cryotherapy, Moist heat, Taping, Traction, Ultrasound, Ionotophoresis '4mg'$ /ml Dexamethasone, and Manual therapy  PLAN FOR NEXT SESSION: Progressive R upper quarter strengthening;  Lyndel Safe Stanley Lyness PT, DPT, GCS  Rawad Bochicchio 07/30/2022, 1:44 PM

## 2022-07-30 ENCOUNTER — Ambulatory Visit: Payer: Medicare PPO

## 2022-07-30 DIAGNOSIS — M6281 Muscle weakness (generalized): Secondary | ICD-10-CM | POA: Diagnosis not present

## 2022-07-30 DIAGNOSIS — G8929 Other chronic pain: Secondary | ICD-10-CM | POA: Diagnosis not present

## 2022-07-30 DIAGNOSIS — M25511 Pain in right shoulder: Secondary | ICD-10-CM | POA: Diagnosis not present

## 2022-08-05 NOTE — Therapy (Signed)
OUTPATIENT PHYSICAL THERAPY NECK/UPPER EXTREMITY TREATMENT/GOAL UPDATE   Patient Name: Angie Copeland MRN: [id number] DOB:[birth year], 72 y.o., female Today's Date: 08/05/2022    Past Medical History:  Diagnosis Date   Allergy    DDD (degenerative disc disease), lumbar    Esophageal mass 2015   Found incidently on CXR- Saw ENT and for visualization and nothing was there.    GERD (gastroesophageal reflux disease)    Hyperlipidemia    Osteoporosis    Past Surgical History:  Procedure Laterality Date   COLONOSCOPY WITH PROPOFOL N/A 02/06/2021   Procedure: COLONOSCOPY WITH PROPOFOL;  Surgeon: Virgel Manifold, MD;  Location: [facility] ENDOSCOPY;  Service: Endoscopy;  Laterality: N/A;   EYE SURGERY     cataract surgery    NOSE SURGERY     Cartilage removed from left side of nose   Patient Active Problem List   Diagnosis Date Noted   COVID-19 05/14/2022   Family history of colon cancer    Cecal polyp    Polyp of ascending colon    Vitamin D deficiency 12/19/2016   Advance directive discussed with patient 12/17/2016   Rosacea 10/15/2016   Allergic rhinitis 10/15/2016   GE reflux 10/15/2016   Hyperlipidemia, unspecified 10/15/2016   Osteoporosis 10/15/2016   Fever blister 10/15/2016   Anxiety 10/15/2016   DDD (degenerative disc disease), lumbar 01/20/2012   PCP: Valerie Roys, DO  REFERRING PROVIDER: Valerie Roys, DO  REFERRING DIAGNOSIS: Paraesthesias  THERAPY DIAG: Muscle weakness (generalized)  Chronic right shoulder pain  RATIONALE FOR EVALUATION AND TREATMENT: Rehabilitation  ONSET DATE: 06/20/12 (approximate)  FOLLOW UP APPT WITH PROVIDER: Yes, January 2024  FROM INITIAL EVALUATION SUBJECTIVE:                                                                                                                                                                                         Chief Complaint: Bilateral hand numbness  Pertinent History Pt reports that  she wakes up at the night and her hands are numb. She frequently rolls and changes position while she is sleeping. When she lays on her side she puts her upper arm down in front of her with her wrist extended on the bed and notices that this aggravates her symptoms. She also gets numbness with hard gripping on the steering wheel with driving. Pt first noticed symptoms "about a decade ago." She notices that she drops things a lot and states, "my grip is not like it used to be." She is having bilateral hand numbness occasionally in the entire hand but mostly just in digits 1-3 BUE as well as frequent hand cramps. She  denies any color changes or temperature changes in her hands. She also complains of her feet "turning white on the bottom." Pt reports that she can turn her head in the shower and will feel her tongue go numb. She also complains of soreness in her R lateral upper arm.   06/11/22 Cervical Radiographs IMPRESSION: 1. 3 mm anterolisthesis of C3 versus C4. 2. Degenerative disc disease at C5-6. 3. Uncovertebral degenerative changes. 4. No other abnormalities.  06/11/22 Lumbar Radiographs IMPRESSION: 1. Mild curvature of the lumbar spine, apex to the right. Trace retrolisthesis L1 versus L2. 2. Degenerative disc disease and lower lumbar facet degenerative changes. 3. Calcified atherosclerotic changes in the abdominal aorta.  Pain:  Pain Intensity: Present: 0/10, Best: 0/10, Worst: 7/10 Pain location: Bilateral hands, digits 1-3. Also complains of R upper arm soreness Pain Quality:  soreness   Radiating: No  Numbness/Tingling: Yes, digits 1-3 both hands Focal Weakness: Yes, bilateral hand gripping weakness Aggravating factors: sleeping, gripping steering wheel, writing Relieving factors: rest, massaging hands 24-hour pain behavior: worse overnight History of prior neck injury, pain, surgery, or therapy: No Falls: Has patient fallen in last 6 months? No Dominant hand: right Imaging:  Yes, see history Prior level of function: Independent Occupational demands: Retired Surveyor, quantity: Used to [employer], reading, crafts, gardening Red flags: Denies personal history of cancer, h/o spinal tumors, history of compression fracture, chills/fever, night sweats, nausea, vomiting, unrelenting pain;  Precautions: None  Weight Bearing Restrictions: No  Living Environment Lives with: lives alone Lives in: House/apartment, two story, no recent trouble with steps  Patient Goals: Decrease hand symptoms   OBJECTIVE:   Patient Surveys  FOTO : 60, predicted improvement to 38  Cognition Patient is oriented to person, place, and time.  Recent memory is intact.  Remote memory is intact.  Attention span and concentration are intact.  Expressive speech is intact.  Patient's fund of knowledge is within normal limits for educational level.    Gross Musculoskeletal Assessment Tremor: None Bulk: Normal Tone: Normal  Gait Deferred  Posture Mild forward head and rounded shoulders, full postural assessment deferred  AROM  AROM (Normal range in degrees) AROM 06/20/2022  Cervical  Flexion (50) 60  Extension (80) 30  Right lateral flexion (45) 30  Left lateral flexion (45) 25  Right rotation (85) 74  Left rotation (85) 71  (* = pain; Blank rows = not tested)  Pain at end range shoulder flexion and abduction on the right;  AROM  AROM (Normal range in degrees) AROM   Right Left  Shoulder    Flexion 174 168  Extension    Abduction 169 171  External Rotation 81 81  Internal Rotation 68 69  Hands Behind Head    Hands Behind Back        Elbow    Flexion WNL WNL  Extension WNL WNL  Pronation    Supination    (* = pain; Blank rows = not tested)   MMT MMT (out of 5) Right 06/20/2022 Left 06/20/2022  Cervical (isometric)  Flexion WNL  Extension WNL  Lateral Flexion WNL WNL  Rotation WNL WNL      Shoulder   Flexion 5 5  Extension    Abduction 4* 4*   External rotation 4 4  Internal rotation 4+ 4+  Horizontal abduction    Horizontal adduction    Lower Trapezius    Rhomboids        Elbow  Flexion 4+ 4+  Extension 4  4  Pronation 4   Supination 4       Wrist  Flexion 5 5  Extension 4 4  Radial deviation    Ulnar deviation        MCP  Flexion 5 5  Extension 4 4  Abduction 5 5  Adduction 5 5  (* = pain; Blank rows = not tested)  Grip Strength:  R: 39.5#, 36.4#, 35.7# (37.2#) L: 22.7#, 25.3#, 29.9# (26.0#)  Sensation Deferred  Reflexes Deferred  Palpation Deferred  Repeated Movements Deferred  Passive Accessory Intervertebral Motion Cervical passive accessory mobility testing: tender and hypomobile throughout but no reproduction of R shoulder pain;  SPECIAL TESTS Spurlings A (ipsilateral lateral flexion/axial compression): R: Negative L: Negative Spurlings B (ipsilateral lateral flexion/contralateral rotation/axial compression): R: Negative L: Negative Distraction Test: Not examined  Hoffman Sign (cervical cord compression): R: Positive L: Positive ULTT Median: R: Negative L: Negative ULTT Ulnar: R: Negative L: Negative ULTT Radial: R: Negative L: Negative  Phalen: Positive Reverse Phalen: Positive Tinel: Positive; Carpal Compression Test: Positive  Rotator Cuff  Drop Arm Test: Negative Painful Arc (Pain from 60 to 120 degrees scaption): Negative Infraspinatus Muscle Test: Negative If all 3 tests positive, the probability of a full-thickness rotator cuff tear is 91%  Subacromial Impingement Hawkins-Kennedy: Negative Neer (Block scapula, PROM flexion): Negative Painful Arc (Pain from 60 to 120 degrees scaption): Negative Empty Can: Negative External Rotation Resistance: Negative Horizontal Adduction: Negative Scapular Assist: Not examined  Labral Tear Biceps Load II (120 elevation, full ER, 90 elbow flexion, full supination, resisted elbow flexion): Negative Crank (160 scaption, axial load  with IR/ER): Negative Active Compression Test: Negative  Bicep Tendon Pathology Speed (shoulder flexion to 90, external rotation, full elbow extension, and forearm supination with resistance: Positive Yergason's (resisted shoulder ER and supination/biceps tendon pathology): Negative  Shoulder Instability Sulcus Sign: Negative Anterior Apprehension: Negative   TODAY'S TREATMENT    SUBJECTIVE: Pt reports that she was very sore for multiple days after the last therapy session. She has continued to notice improvement in her carpal tunnel symptoms since wearing her wrist braces at night. If she doesn't wear her braces she notices worsening numbness in her hands during the day.    PAIN: None currently   Ther-ex  Update goals with patient, consider adding new goals regarding R shoulder? (Have pt complete QuickDASH, FOTO for R shoulder)  UBE x 4 minutes for warm-up and UE strengthening (2 forward/2 backward); Supine bilateral shoulder flexion with 2# dumbbells (DB) 2 x 10 BUE; Supine serraturs punch with 2# DB 2 x 10 BUE; Supine R shoulder cirlces with 2# DB CW/CCW 2 x 10 each direction; Supine pvc chest press with manual resistance 2 x 10; Supine bicep curls with 2# DB 2 x 10 BUE; Supine R tricep extension with manual resistance 2 x 10; L sidelying R shoulder abduction with 2# DB x 10, x 8; L sidelying R shoulder ER with 2# DB 2 x 10; Cold pack applied to R shoulder during HEP review;   Not performed: Seated Nautilus lat pull down 60# x 10, 70# x 10; Seated Nautilus row 30# x 10, 40# x 15; Standing Nautilus shoulder extension 40# x 10; Seated bilateral shoulder flexion with 2# dumbbells (DB) 2 x 10 BUE; Seated bicep curls with 2# DB x 10, 4# x 10 BUE; Seated bilateral shoulder scaption with 2# DB 2 x 10 BUE; Seated bilateral shoulder "W's" with green tband 2 x 10;    PATIENT EDUCATION:  Education details: Pt educated throughout session about proper posture and technique with  exercises. Improved exercise technique, movement at target joints, use of target muscles after min to mod verbal, visual, tactile cues.  Person educated: Patient Education method: Explanation Education comprehension: verbalized understanding   HOME EXERCISE PROGRAM: Access Code: [email or web address] URL: [email or web address] Date: 07/02/2022 Prepared by: Roxana Hires  Exercises - Shoulder W - External Rotation with Resistance  - 1 x daily - 7 x weekly - 2 sets - 10 reps - 2s hold - Seated Shoulder Flexion with Dumbbells  - 1 x daily - 7 x weekly - 2 sets - 10 reps - 2s hold - Seated Bilateral Shoulder Scaption with Dumbbells  - 1 x daily - 7 x weekly - 2 sets - 10 reps - 2s hold - Standing Shoulder Row with Anchored Resistance  - 1 x daily - 7 x weekly - 2 sets - 10 reps - 2s hold  Pt advised to wear carpal tunnel wrist braces at night;   ASSESSMENT:  CLINICAL IMPRESSION: Progressed pain-free/pain-limited R shoulder strengthening during session today. Regressed to supine and sidelying exercises due to reports of excessive soreness after the last therapy session. She is able to complete all exercises as instructed today and only report mild pain during supine shoulder flexion. Reinforced HEP for strengthening and encouraged pt to continue wearing wrist braces at night for bilateral CTS. Plan ot progress R upper quarter strengthening at future sessions. Will consider updating HEP at next session if appropriate. She will benefit from skilled PT to address above impairments and improve overall function with less pain.  REHAB POTENTIAL: Fair    CLINICAL DECISION MAKING: Unstable/unpredictable  EVALUATION COMPLEXITY: High   GOALS: Goals reviewed with patient? Yes  SHORT TERM GOALS: Target date: 07/18/2022  Pt will be independent with HEP to improve strength and decrease neck pain to improve pain-free function at home and work. Baseline:  Goal status: INITIAL   LONG TERM GOALS:  Target date: 08/15/2022  Pt will increase FOTO to at least 66 to demonstrate significant improvement in function at home and work related to neck pain  Baseline: 06/20/22: 63 Goal status: INITIAL  2.  Pt will report at least 75% improvement in hand numbness symptoms in order to improve her function at home with less symptoms. Baseline:  Goal status: INITIAL   PLAN: PT FREQUENCY: 2x/week  PT DURATION: 8 weeks  PLANNED INTERVENTIONS: Therapeutic exercises, Therapeutic activity, Neuromuscular re-education, Balance training, Gait training, Patient/Family education, Joint manipulation, Joint mobilization, Vestibular training, Canalith repositioning, Dry Needling, Electrical stimulation, Spinal manipulation, Spinal mobilization, Cryotherapy, Moist heat, Taping, Traction, Ultrasound, Ionotophoresis '4mg'$ /ml Dexamethasone, and Manual therapy  PLAN FOR NEXT SESSION: Progressive R upper quarter strengthening;  Lyndel Safe Lailani Tool PT, DPT, G[facility]  Delawrence Fridman 08/05/2022, 8:39 AM

## 2022-08-06 ENCOUNTER — Ambulatory Visit: Payer: Medicare PPO

## 2022-08-06 DIAGNOSIS — G8929 Other chronic pain: Secondary | ICD-10-CM | POA: Diagnosis not present

## 2022-08-06 DIAGNOSIS — M6281 Muscle weakness (generalized): Secondary | ICD-10-CM | POA: Diagnosis not present

## 2022-08-06 DIAGNOSIS — M25511 Pain in right shoulder: Secondary | ICD-10-CM | POA: Diagnosis not present

## 2022-08-09 ENCOUNTER — Encounter: Payer: Self-pay | Admitting: Family Medicine

## 2022-08-09 ENCOUNTER — Telehealth (INDEPENDENT_AMBULATORY_CARE_PROVIDER_SITE_OTHER): Payer: Medicare PPO | Admitting: Family Medicine

## 2022-08-09 DIAGNOSIS — R202 Paresthesia of skin: Secondary | ICD-10-CM | POA: Diagnosis not present

## 2022-08-09 NOTE — Progress Notes (Signed)
Pt scheduled for 6 month follow up in May 2nd at 11am.

## 2022-08-09 NOTE — Progress Notes (Signed)
There were no vitals taken for this visit.   Subjective:    Patient ID: Angie Copeland, female    DOB: [birth year], 72 y.o.   MRN: [id number]  HPI: Angie Copeland is a 72 y.o. female  Chief Complaint  Patient presents with   Numbness        NUMBNESS Duration: chronic Onset: gradual Location: hands and wrists Bilateral: yes Symmetric: no R>L Decreased sensation: yes- when they are numb  Weakness: no Pain: no Quality:  numb and tingling Severity: mild  Frequency: intermittent- much less Trauma: no Recent illness: no Diabetes: no Thyroid disease: no  HIV: no  Alcoholism: no  Spinal cord injury: no Status: better Treatments attempted:   Relevant past medical, surgical, family and social history reviewed and updated as indicated. Interim medical history since our last visit reviewed. Allergies and medications reviewed and updated.  Review of Systems  Constitutional: Negative.   Respiratory: Negative.    Cardiovascular: Negative.   Gastrointestinal: Negative.   Musculoskeletal: Negative.   Neurological: Negative.   Psychiatric/Behavioral: Negative.      Per HPI unless specifically indicated above     Objective:    There were no vitals taken for this visit.  Wt Readings from Last 3 Encounters:  06/11/22 158 lb 4.8 oz (71.8 kg)  05/17/22 152 lb 9.6 oz (69.2 kg)  02/06/21 148 lb (67.1 kg)    Physical Exam Vitals and nursing note reviewed.  Constitutional:      General: She is not in acute distress.    Appearance: Normal appearance. She is normal weight. She is not ill-appearing, toxic-appearing or diaphoretic.  HENT:     Head: Normocephalic and atraumatic.     Right Ear: External ear normal.     Left Ear: External ear normal.     Nose: Nose normal.     Mouth/Throat:     Mouth: Mucous membranes are moist.     Pharynx: Oropharynx is clear.  Eyes:     General: No scleral icterus.       Right eye: No discharge.        Left eye: No discharge.      Conjunctiva/sclera: Conjunctivae normal.     Pupils: Pupils are equal, round, and reactive to light.  Pulmonary:     Effort: Pulmonary effort is normal. No respiratory distress.     Comments: Speaking in full sentences Musculoskeletal:        General: Normal range of motion.     Cervical back: Normal range of motion.  Skin:    Coloration: Skin is not jaundiced or pale.     Findings: No bruising, erythema, lesion or rash.  Neurological:     Mental Status: She is alert and oriented to person, place, and time. Mental status is at baseline.  Psychiatric:        Mood and Affect: Mood normal.        Behavior: Behavior normal.        Thought Content: Thought content normal.        Judgment: Judgment normal.     Results for orders placed or performed in visit on 05/17/22  CBC with Differential/Platelet  Result Value Ref Range   WBC 4.8 3.4 - 10.8 x10E3/uL   RBC 4.84 3.77 - 5.28 x10E6/uL   Hemoglobin 14.9 11.1 - 15.9 g/dL   Hematocrit 44.8 34.0 - 46.6 %   MCV 93 79 - 97 fL   MCH 30.8 26.6 - 33.0 pg  MCHC 33.3 31.5 - 35.7 g/dL   RDW 12.0 11.7 - 15.4 %   Platelets 208 150 - 450 x10E3/uL   Neutrophils 47 Not Estab. %   Lymphs 43 Not Estab. %   Monocytes 8 Not Estab. %   Eos 1 Not Estab. %   Basos 1 Not Estab. %   Neutrophils Absolute 2.3 1.4 - 7.0 x10E3/uL   Lymphocytes Absolute 2.1 0.7 - 3.1 x10E3/uL   Monocytes Absolute 0.4 0.1 - 0.9 x10E3/uL   EOS (ABSOLUTE) 0.1 0.0 - 0.4 x10E3/uL   Basophils Absolute 0.0 0.0 - 0.2 x10E3/uL   Immature Granulocytes 0 Not Estab. %   Immature Grans (Abs) 0.0 0.0 - 0.1 x10E3/uL  Comprehensive metabolic panel  Result Value Ref Range   Glucose 72 70 - 99 mg/dL   BUN 16 8 - 27 mg/dL   Creatinine, Ser 0.87 0.57 - 1.00 mg/dL   eGFR 71 >59 mL/min/1.73   BUN/Creatinine Ratio 18 12 - 28   Sodium 139 134 - 144 mmol/L   Potassium 4.0 3.5 - 5.2 mmol/L   Chloride 100 96 - 106 mmol/L   CO2 22 20 - 29 mmol/L   Calcium 9.4 8.7 - 10.3 mg/dL   Total  Protein 7.0 6.0 - 8.5 g/dL   Albumin 4.4 3.8 - 4.8 g/dL   Globulin, Total 2.6 1.5 - 4.5 g/dL   Albumin/Globulin Ratio 1.7 1.2 - 2.2   Bilirubin Total 0.3 0.0 - 1.2 mg/dL   Alkaline Phosphatase 57 44 - 121 IU/L   AST 20 0 - 40 IU/L   ALT 14 0 - 32 IU/L  Lipid Panel w/o Chol/HDL Ratio  Result Value Ref Range   Cholesterol, Total 257 (H) 100 - 199 mg/dL   Triglycerides 129 0 - 149 mg/dL   HDL 62 >39 mg/dL   VLDL Cholesterol Cal 23 5 - 40 mg/dL   LDL Chol Calc (NIH) 172 (H) 0 - 99 mg/dL  Urinalysis, Routine w reflex microscopic  Result Value Ref Range   Specific Gravity, UA 1.015 1.005 - 1.030   pH, UA 5.5 5.0 - 7.5   Color, UA Yellow Yellow   Appearance Ur Clear Clear   Leukocytes,UA Negative Negative   Protein,UA Negative Negative/Trace   Glucose, UA Negative Negative   Ketones, UA Trace (A) Negative   RBC, UA Negative Negative   Bilirubin, UA Negative Negative   Urobilinogen, Ur 0.2 0.2 - 1.0 mg/dL   Nitrite, UA Negative Negative   Microscopic Examination Comment   TSH  Result Value Ref Range   TSH 1.950 0.450 - 4.500 uIU/mL  VITAMIN D 25 Hydroxy (Vit-D Deficiency, Fractures)  Result Value Ref Range   Vit D, 25-Hydroxy 24.3 (L) 30.0 - 100.0 ng/mL      Assessment & Plan:   Problem List Items Addressed This Visit   None Visit Diagnoses     Paresthesias    -  Primary   Significantly better with PT and CT braces. Will hold on medicine for now. Call with any concerns. Continue to monitor.        Follow up plan: Return for May, 6 months follow up.   This visit was completed via video visit through MyChart due to the restrictions of the COVID-19 pandemic. All issues as above were discussed and addressed. Physical exam was done as above through visual confirmation on video through MyChart. If it was felt that the patient should be evaluated in the office, they were directed there. The patient verbally  consented to this visit. Location of the patient: home Location of  the provider: home Those involved with this call:  Provider: Park Liter, DO CMA: Irena Reichmann, [place] Desk/Registration: [employer]  Time spent on call:  15 minutes with patient face to face via video conference. More than 50% of this time was spent in counseling and coordination of care. 23 minutes total spent in review of patient's record and preparation of their chart.

## 2022-08-09 NOTE — Patient Instructions (Addendum)
[facility] [address] [place], [place] [address] Phone: [phone]  Please call to schedule your mammogram and/or bone density: [facility] [place]: [address], [place], [place] [address] Phone: [phone]  [place] [facility] [address] [place],  [place]  [address] Phone: [phone]

## 2022-08-19 ENCOUNTER — Ambulatory Visit
Admission: RE | Admit: 2022-08-19 | Discharge: 2022-08-19 | Disposition: A | Discharge status: Home / Self Care | Payer: Medicare PPO | Source: Ambulatory Visit | Attending: Family Medicine | Admitting: Family Medicine

## 2022-08-19 DIAGNOSIS — Z1231 Encounter for screening mammogram for malignant neoplasm of breast: Secondary | ICD-10-CM | POA: Insufficient documentation

## 2022-08-29 DIAGNOSIS — L570 Actinic keratosis: Secondary | ICD-10-CM | POA: Diagnosis not present

## 2022-08-29 DIAGNOSIS — L821 Other seborrheic keratosis: Secondary | ICD-10-CM | POA: Diagnosis not present

## 2022-08-29 DIAGNOSIS — L720 Epidermal cyst: Secondary | ICD-10-CM | POA: Diagnosis not present

## 2022-08-29 DIAGNOSIS — L298 Other pruritus: Secondary | ICD-10-CM | POA: Diagnosis not present

## 2022-09-11 IMAGING — MG MM DIGITAL SCREENING BILAT W/ TOMO AND CAD
8 series · 9 of 24 positions shown · non-contrast
Comparison: Previous exam(s).

CLINICAL DATA: Screening.

EXAM:
DIGITAL SCREENING BILATERAL MAMMOGRAM WITH TOMOSYNTHESIS AND CAD
TECHNIQUE: Bilateral screening digital craniocaudal and mediolateral oblique
mammograms were obtained. Bilateral screening digital breast
tomosynthesis was performed. The images were evaluated with
computer-aided detection.

[R CC synth-2D]
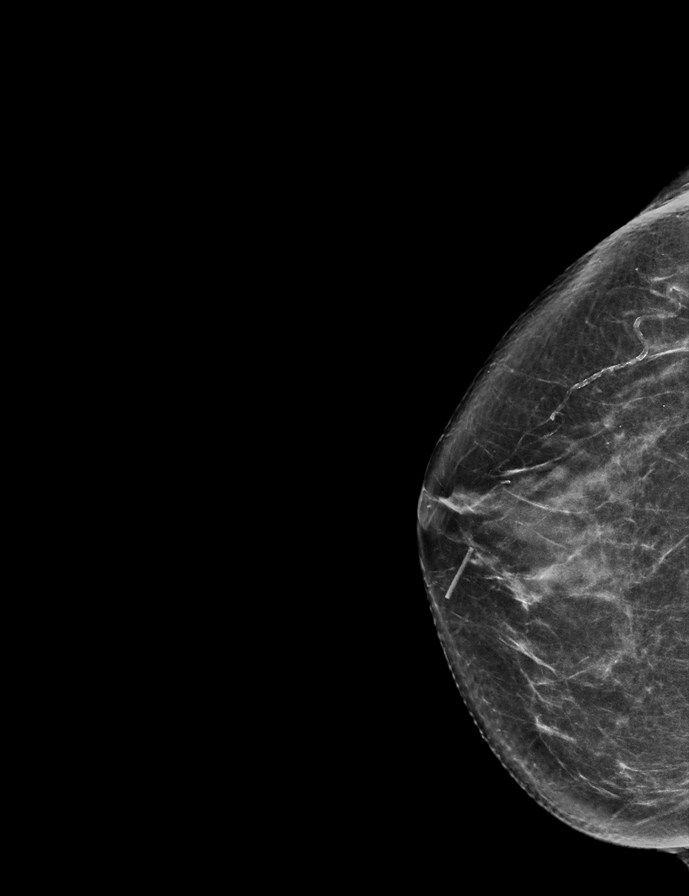

[L MLO synth-2D]
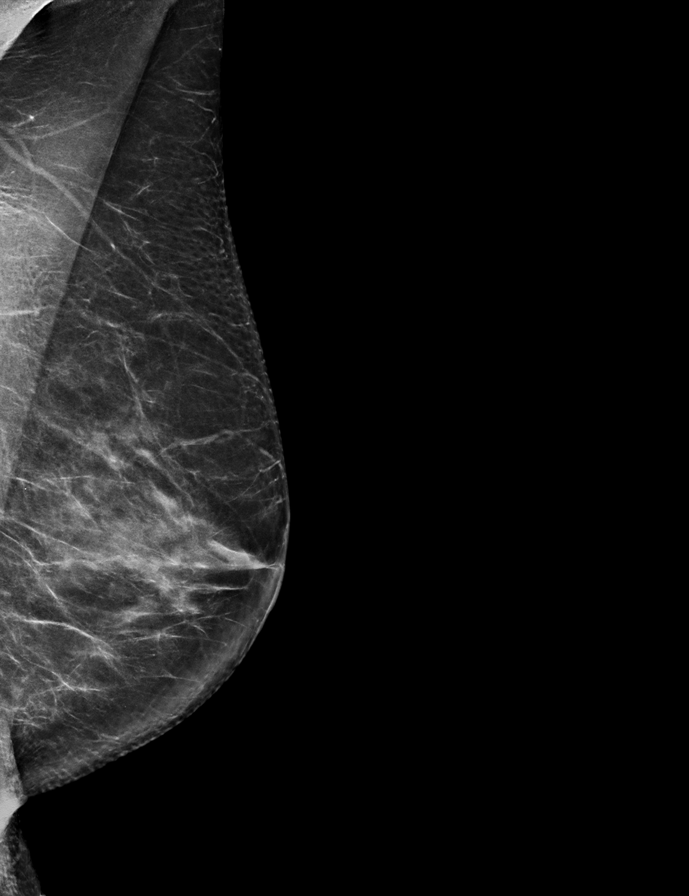

[R MLO synth-2D]
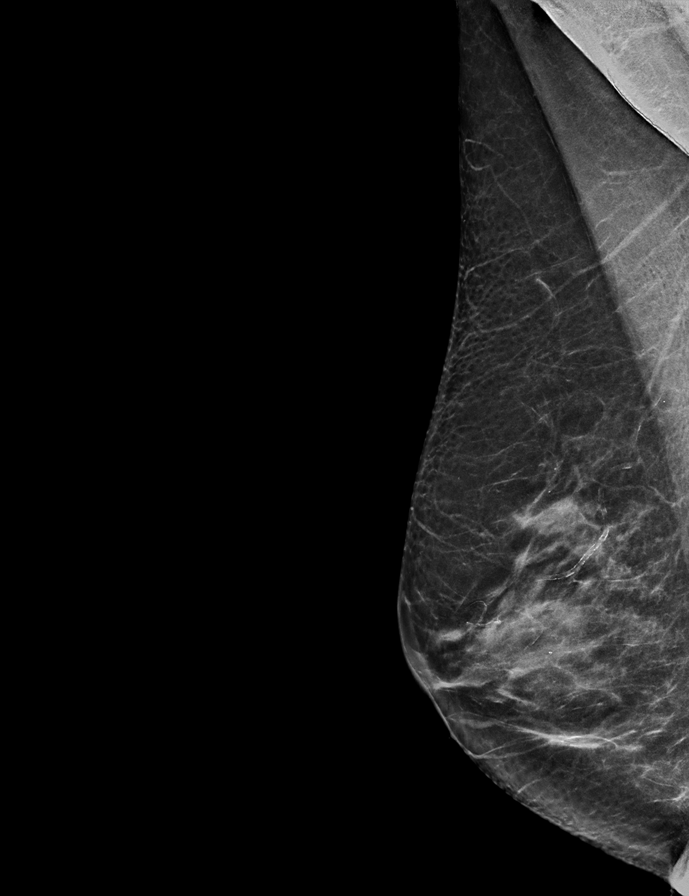

[L CC synth-2D]
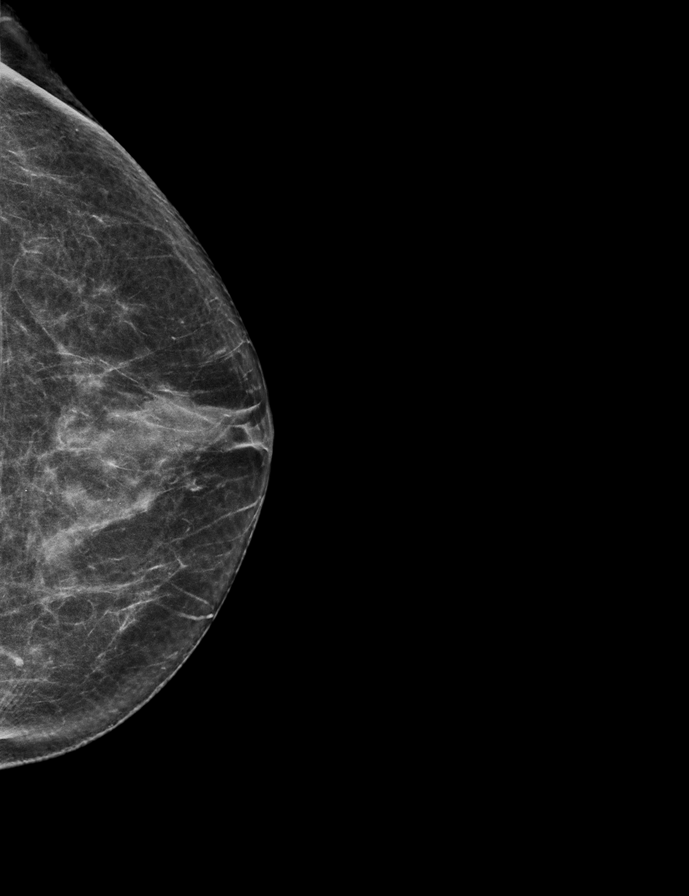

[R MLO tomo · 2 of 65 frames shown]
[frame 21/65]
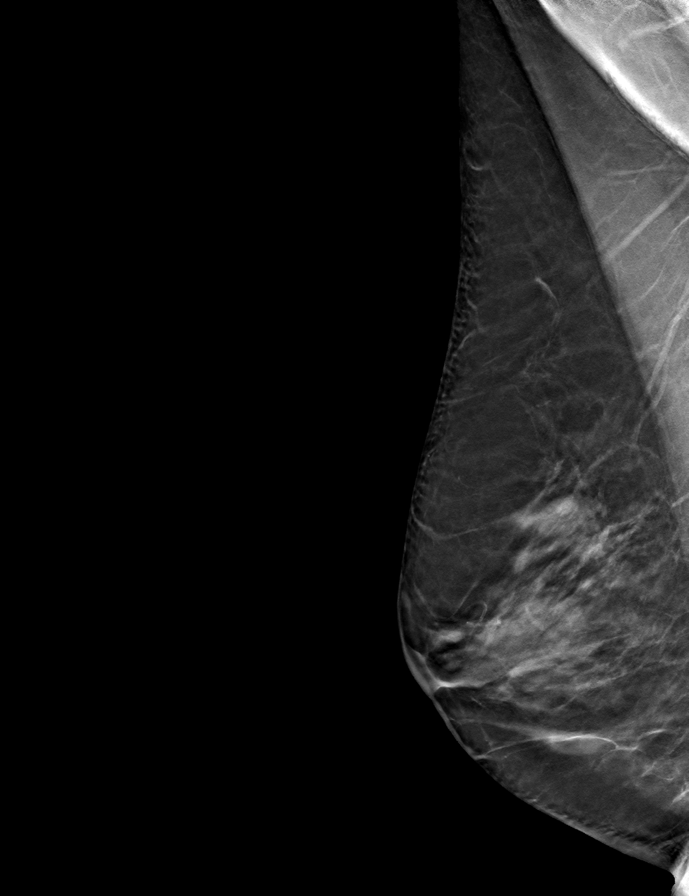
[frame 33/65]
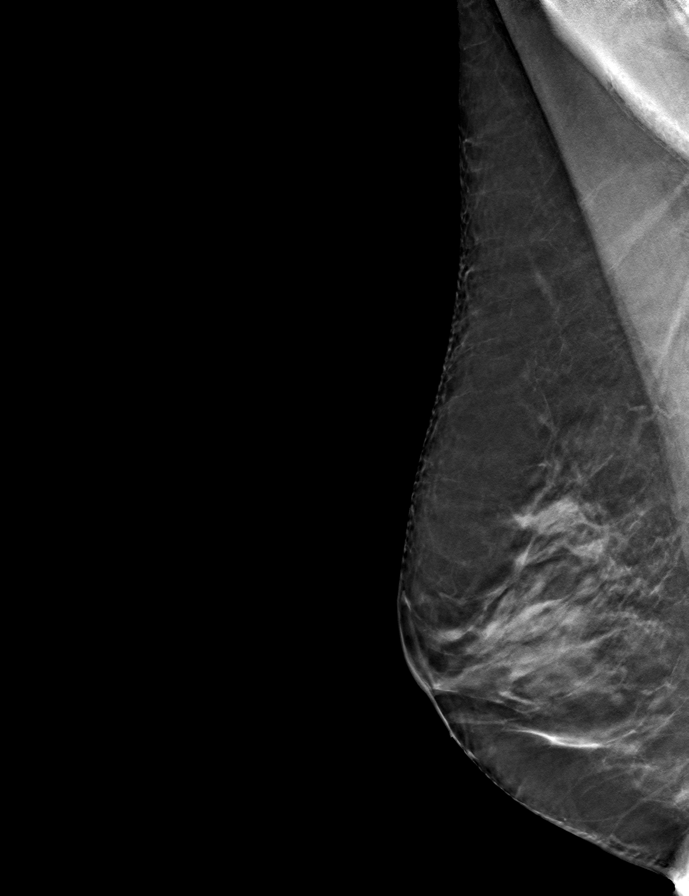

[L MLO tomo · tomo slice 35/69.0]
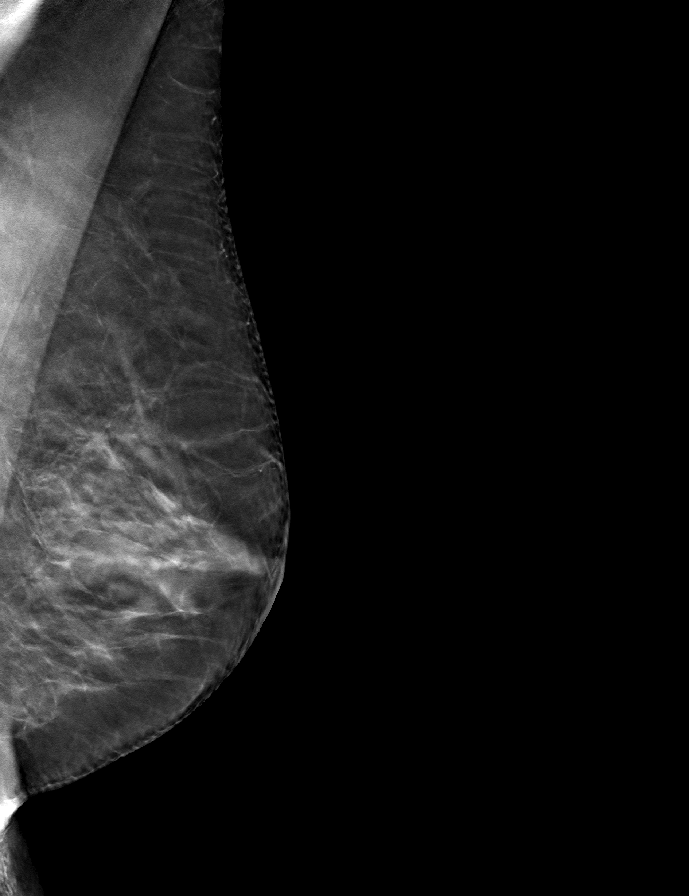

[L CC tomo · tomo slice 33/64.0]
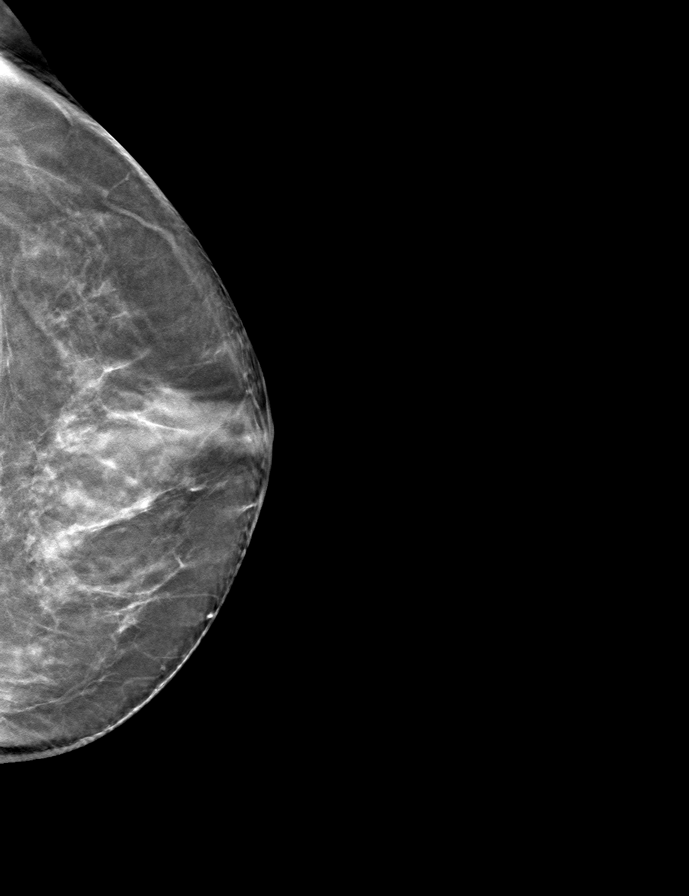

[R CC tomo · tomo slice 37/72.0]
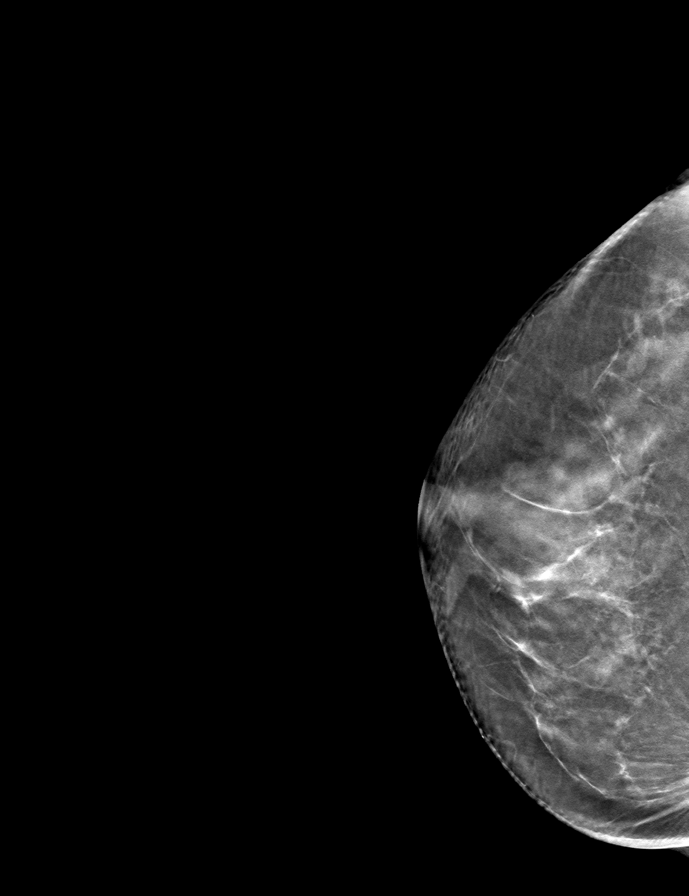

[9 of 24 positions shown; findings below may reference images not displayed]

ACR Breast Density Category c: The breast tissue is heterogeneously
dense, which may obscure small masses.
FINDINGS: There are no findings suspicious for malignancy.
IMPRESSION: No mammographic evidence of malignancy. A result letter of this
screening mammogram will be mailed directly to the patient.

RECOMMENDATION:
Screening mammogram in one year. (Code:Q3-W-BC3)

BI-RADS CATEGORY  1: Negative.

## 2022-11-07 ENCOUNTER — Encounter: Payer: Self-pay | Admitting: Family Medicine

## 2022-11-07 ENCOUNTER — Ambulatory Visit: Payer: Medicare PPO | Admitting: Family Medicine

## 2022-11-07 VITALS — BP 138/77 | HR 67 | Temp 98.5°F | Ht 63.0 in | Wt 161.5 lb

## 2022-11-07 DIAGNOSIS — R202 Paresthesia of skin: Secondary | ICD-10-CM | POA: Diagnosis not present

## 2022-11-07 DIAGNOSIS — E559 Vitamin D deficiency, unspecified: Secondary | ICD-10-CM | POA: Diagnosis not present

## 2022-11-07 DIAGNOSIS — K219 Gastro-esophageal reflux disease without esophagitis: Secondary | ICD-10-CM

## 2022-11-07 DIAGNOSIS — E782 Mixed hyperlipidemia: Secondary | ICD-10-CM

## 2022-11-07 NOTE — Assessment & Plan Note (Signed)
Under good control on current regimen. Continue current regimen. Continue to monitor. Call with any concerns. Labs drawn today.  

## 2022-11-07 NOTE — Assessment & Plan Note (Signed)
Rechecking labs today. Await results. Treat as needed.  °

## 2022-11-07 NOTE — Progress Notes (Signed)
BP 138/77 (BP Location: Left Arm, Cuff Size: Normal)   Pulse 67   Temp 98.5 F (36.9 C) (Oral)   Ht 5\' 3"  (1.6 m)   Wt 161 lb 8 oz (73.3 kg)   SpO2 98%   BMI 28.61 kg/m    Subjective:    Patient ID: Angie Copeland, female    DOB: March 11, 1951, 72 y.o.   MRN: [id number]  HPI: Angie Copeland is a 72 y.o. female  Chief Complaint  Patient presents with   Numbness    Patient says she is waking up every night with a limb feeling "asleep" and says she will walk around and then she is able to go back to sleep. Patient says she is not sleeping well, but it is due to the numbness. Patient says she has sleep on her back to get a little relief.    NUMBNESS Duration: chronic Onset: gradual Location: R arm, bilateral feet and hands Bilateral: yes Symmetric: no Decreased sensation: yes  Weakness: no Pain: no Quality:  numb and tingling Severity: moderate  Frequency: intermittent Trauma: no Recent illness: no Diabetes: no Thyroid disease: no  HIV: no  Alcoholism: no  Spinal cord injury: no Status: stable  HYPERLIPIDEMIA Hyperlipidemia status: Stable Satisfied with current treatment?  yes Side effects:  no Medication compliance: Not on anything Past cholesterol meds: none Supplements: none Aspirin:  no The 10-year ASCVD risk score (Arnett DK, et al., 2019) is: 15.4%   Values used to calculate the score:     Age: 68 years     Sex: Female     Is Non-Hispanic African American: No     Diabetic: No     Tobacco smoker: No     Systolic Blood Pressure: 138 mmHg     Is BP treated: No     HDL Cholesterol: 62 mg/dL     Total Cholesterol: 257 mg/dL Chest pain:  no Coronary artery disease:  no  GERD GERD control status: stable Satisfied with current treatment? yes Heartburn frequency: occasionally Medication side effects: N/A  Medication compliance: not on anything Dysphagia: no Odynophagia:  no Hematemesis: no Blood in stool: no EGD: no   Relevant past medical, surgical,  family and social history reviewed and updated as indicated. Interim medical history since our last visit reviewed. Allergies and medications reviewed and updated.  Review of Systems  Constitutional: Negative.   Respiratory: Negative.    Cardiovascular: Negative.   Gastrointestinal: Negative.   Musculoskeletal: Negative.   Neurological: Negative.   Psychiatric/Behavioral: Negative.      Per HPI unless specifically indicated above     Objective:    BP 138/77 (BP Location: Left Arm, Cuff Size: Normal)   Pulse 67   Temp 98.5 F (36.9 C) (Oral)   Ht 5\' 3"  (1.6 m)   Wt 161 lb 8 oz (73.3 kg)   SpO2 98%   BMI 28.61 kg/m   Wt Readings from Last 3 Encounters:  11/07/22 161 lb 8 oz (73.3 kg)  06/11/22 158 lb 4.8 oz (71.8 kg)  05/17/22 152 lb 9.6 oz (69.2 kg)    Physical Exam Vitals and nursing note reviewed.  Constitutional:      General: She is not in acute distress.    Appearance: Normal appearance. She is normal weight. She is not ill-appearing, toxic-appearing or diaphoretic.  HENT:     Head: Normocephalic and atraumatic.     Right Ear: External ear normal.     Left Ear: External ear  normal.     Nose: Nose normal.     Mouth/Throat:     Mouth: Mucous membranes are moist.     Pharynx: Oropharynx is clear.  Eyes:     General: No scleral icterus.       Right eye: No discharge.        Left eye: No discharge.     Extraocular Movements: Extraocular movements intact.     Conjunctiva/sclera: Conjunctivae normal.     Pupils: Pupils are equal, round, and reactive to light.  Cardiovascular:     Rate and Rhythm: Normal rate and regular rhythm.     Pulses: Normal pulses.     Heart sounds: Normal heart sounds. No murmur heard.    No friction rub. No gallop.  Pulmonary:     Effort: Pulmonary effort is normal. No respiratory distress.     Breath sounds: Normal breath sounds. No stridor. No wheezing, rhonchi or rales.  Chest:     Chest wall: No tenderness.  Musculoskeletal:         General: Normal range of motion.     Cervical back: Normal range of motion and neck supple.  Skin:    General: Skin is warm and dry.     Capillary Refill: Capillary refill takes less than 2 seconds.     Coloration: Skin is not jaundiced or pale.     Findings: No bruising, erythema, lesion or rash.  Neurological:     General: No focal deficit present.     Mental Status: She is alert and oriented to person, place, and time. Mental status is at baseline.  Psychiatric:        Mood and Affect: Mood normal.        Behavior: Behavior normal.        Thought Content: Thought content normal.        Judgment: Judgment normal.     Results for orders placed or performed in visit on 05/17/22  CBC with Differential/Platelet  Result Value Ref Range   WBC 4.8 3.4 - 10.8 x10E3/uL   RBC 4.84 3.77 - 5.28 x10E6/uL   Hemoglobin 14.9 11.1 - 15.9 g/dL   Hematocrit 16.1 09.6 - 46.6 %   MCV 93 79 - 97 fL   MCH 30.8 26.6 - 33.0 pg   MCHC 33.3 31.5 - 35.7 g/dL   RDW 04.5 40.9 - 81.1 %   Platelets 208 150 - 450 x10E3/uL   Neutrophils 47 Not Estab. %   Lymphs 43 Not Estab. %   Monocytes 8 Not Estab. %   Eos 1 Not Estab. %   Basos 1 Not Estab. %   Neutrophils Absolute 2.3 1.4 - 7.0 x10E3/uL   Lymphocytes Absolute 2.1 0.7 - 3.1 x10E3/uL   Monocytes Absolute 0.4 0.1 - 0.9 x10E3/uL   EOS (ABSOLUTE) 0.1 0.0 - 0.4 x10E3/uL   Basophils Absolute 0.0 0.0 - 0.2 x10E3/uL   Immature Granulocytes 0 Not Estab. %   Immature Grans (Abs) 0.0 0.0 - 0.1 x10E3/uL  Comprehensive metabolic panel  Result Value Ref Range   Glucose 72 70 - 99 mg/dL   BUN 16 8 - 27 mg/dL   Creatinine, Ser 9.14 0.57 - 1.00 mg/dL   eGFR 71 >78 GN/FAO/1.30   BUN/Creatinine Ratio 18 12 - 28   Sodium 139 134 - 144 mmol/L   Potassium 4.0 3.5 - 5.2 mmol/L   Chloride 100 96 - 106 mmol/L   CO2 22 20 - 29 mmol/L   Calcium 9.4  8.7 - 10.3 mg/dL   Total Protein 7.0 6.0 - 8.5 g/dL   Albumin 4.4 3.8 - 4.8 g/dL   Globulin, Total 2.6 1.5 - 4.5  g/dL   Albumin/Globulin Ratio 1.7 1.2 - 2.2   Bilirubin Total 0.3 0.0 - 1.2 mg/dL   Alkaline Phosphatase 57 44 - 121 IU/L   AST 20 0 - 40 IU/L   ALT 14 0 - 32 IU/L  Lipid Panel w/o Chol/HDL Ratio  Result Value Ref Range   Cholesterol, Total 257 (H) 100 - 199 mg/dL   Triglycerides 161 0 - 149 mg/dL   HDL 62 >09 mg/dL   VLDL Cholesterol Cal 23 5 - 40 mg/dL   LDL Chol Calc (NIH) 604 (H) 0 - 99 mg/dL  Urinalysis, Routine w reflex microscopic  Result Value Ref Range   Specific Gravity, UA 1.015 1.005 - 1.030   pH, UA 5.5 5.0 - 7.5   Color, UA Yellow Yellow   Appearance Ur Clear Clear   Leukocytes,UA Negative Negative   Protein,UA Negative Negative/Trace   Glucose, UA Negative Negative   Ketones, UA Trace (A) Negative   RBC, UA Negative Negative   Bilirubin, UA Negative Negative   Urobilinogen, Ur 0.2 0.2 - 1.0 mg/dL   Nitrite, UA Negative Negative   Microscopic Examination Comment   TSH  Result Value Ref Range   TSH 1.950 0.450 - 4.500 uIU/mL  VITAMIN D 25 Hydroxy (Vit-D Deficiency, Fractures)  Result Value Ref Range   Vit D, 25-Hydroxy 24.3 (L) 30.0 - 100.0 ng/mL      Assessment & Plan:   Problem List Items Addressed This Visit       Digestive   GE reflux    Under good control on current regimen. Continue current regimen. Continue to monitor. Call with any concerns. Labs drawn today.        Relevant Orders   Comprehensive metabolic panel   CBC with Differential/Platelet     Other   Hyperlipidemia, unspecified    Rechecking labs today. Await results. Treat as needed.       Relevant Orders   Comprehensive metabolic panel   Lipid Panel w/o Chol/HDL Ratio   Vitamin D deficiency    Rechecking labs today. Await results. Treat as needed.       Relevant Orders   Comprehensive metabolic panel   VITAMIN D 25 Hydroxy (Vit-D Deficiency, Fractures)   Other Visit Diagnoses     Paresthesias    -  Primary   Does not want to start medicine at this time. Continue to  monitor. Call with any concerns.        Follow up plan: Return in about 6 months (around 05/10/2023) for physical.

## 2022-11-08 LAB — CBC WITH DIFFERENTIAL/PLATELET
Basophils Absolute: 0.1 10*3/uL (ref 0.0–0.2)
Basos: 1 %
EOS (ABSOLUTE): 0.1 10*3/uL (ref 0.0–0.4)
Eos: 2 %
Hematocrit: 43.2 % (ref 34.0–46.6)
Hemoglobin: 14.3 g/dL (ref 11.1–15.9)
Immature Grans (Abs): 0 10*3/uL (ref 0.0–0.1)
Immature Granulocytes: 0 %
Lymphocytes Absolute: 1.6 10*3/uL (ref 0.7–3.1)
Lymphs: 39 %
MCH: 30.6 pg (ref 26.6–33.0)
MCHC: 33.1 g/dL (ref 31.5–35.7)
MCV: 92 fL (ref 79–97)
Monocytes Absolute: 0.4 10*3/uL (ref 0.1–0.9)
Monocytes: 9 %
Neutrophils Absolute: 2 10*3/uL (ref 1.4–7.0)
Neutrophils: 49 %
Platelets: 207 10*3/uL (ref 150–450)
RBC: 4.68 x10E6/uL (ref 3.77–5.28)
RDW: 12.1 % (ref 11.7–15.4)
WBC: 4.2 10*3/uL (ref 3.4–10.8)

## 2022-11-08 LAB — COMPREHENSIVE METABOLIC PANEL
ALT: 14 IU/L (ref 0–32)
AST: 23 IU/L (ref 0–40)
Albumin/Globulin Ratio: 2.2 (ref 1.2–2.2)
Albumin: 4.6 g/dL (ref 3.8–4.8)
Alkaline Phosphatase: 58 IU/L (ref 44–121)
BUN/Creatinine Ratio: 19 (ref 12–28)
BUN: 16 mg/dL (ref 8–27)
Bilirubin Total: 0.5 mg/dL (ref 0.0–1.2)
CO2: 22 mmol/L (ref 20–29)
Calcium: 9.5 mg/dL (ref 8.7–10.3)
Chloride: 102 mmol/L (ref 96–106)
Creatinine, Ser: 0.83 mg/dL (ref 0.57–1.00)
Globulin, Total: 2.1 g/dL (ref 1.5–4.5)
Glucose: 84 mg/dL (ref 70–99)
Potassium: 4.2 mmol/L (ref 3.5–5.2)
Sodium: 141 mmol/L (ref 134–144)
Total Protein: 6.7 g/dL (ref 6.0–8.5)
eGFR: 74 mL/min/{1.73_m2} (ref >59)

## 2022-11-08 LAB — LIPID PANEL W/O CHOL/HDL RATIO
Cholesterol, Total: 267 mg/dL — ABNORMAL HIGH (ref 100–199)
HDL: 91 mg/dL (ref >39)
LDL Chol Calc (NIH): 168 mg/dL — ABNORMAL HIGH (ref 0–99)
Triglycerides: 55 mg/dL (ref 0–149)
VLDL Cholesterol Cal: 8 mg/dL (ref 5–40)

## 2022-11-08 LAB — VITAMIN D 25 HYDROXY (VIT D DEFICIENCY, FRACTURES): Vit D, 25-Hydroxy: 25.9 ng/mL — ABNORMAL LOW (ref 30.0–100.0)

## 2023-01-11 ENCOUNTER — Ambulatory Visit
Admission: EM | Admit: 2023-01-11 | Discharge: 2023-01-11 | Disposition: A | Discharge status: Home / Self Care | Payer: Medicare PPO

## 2023-01-11 ENCOUNTER — Ambulatory Visit (INDEPENDENT_AMBULATORY_CARE_PROVIDER_SITE_OTHER): Payer: Medicare PPO

## 2023-01-11 DIAGNOSIS — M5442 Lumbago with sciatica, left side: Secondary | ICD-10-CM | POA: Diagnosis not present

## 2023-01-11 DIAGNOSIS — M438X6 Other specified deforming dorsopathies, lumbar region: Secondary | ICD-10-CM | POA: Diagnosis not present

## 2023-01-11 DIAGNOSIS — M25562 Pain in left knee: Secondary | ICD-10-CM | POA: Diagnosis not present

## 2023-01-11 DIAGNOSIS — M79605 Pain in left leg: Secondary | ICD-10-CM | POA: Diagnosis not present

## 2023-01-11 DIAGNOSIS — M545 Low back pain, unspecified: Secondary | ICD-10-CM

## 2023-01-11 DIAGNOSIS — G8929 Other chronic pain: Secondary | ICD-10-CM

## 2023-01-11 DIAGNOSIS — M79662 Pain in left lower leg: Secondary | ICD-10-CM

## 2023-01-11 DIAGNOSIS — I700 Atherosclerosis of aorta: Secondary | ICD-10-CM | POA: Diagnosis not present

## 2023-01-11 MED ORDER — PREDNISONE 10 MG PO TABS: ORAL_TABLET | ORAL | 0 refills | Status: DC

## 2023-01-11 NOTE — Discharge Instructions (Signed)
You were seen today for left hip, left knee and left foot pain.  The x-ray of your lumbar spine did show some arthritic changes as well as osteoporosis.  The x-ray of your left hip and left knee are normal.  We are putting you on steroids for 9 days for possible sciatica.  Heat and massage may also be helpful.  Please follow-up with your PCP if symptoms persist or worsen.

## 2023-01-11 NOTE — ED Provider Notes (Signed)
[facility]    CSN: [id number] Arrival date & time: 01/11/23  1211      History   Chief Complaint Chief Complaint  Patient presents with   Leg Pain    HPI Angie Copeland is a 72 y.o. female with a history of lumbar DDD presents to urgent care today with complaint of worsening low back pain, left hip pain, left knee pain and left foot pain.  Angie Copeland reports this started 1 week ago.  Angie Copeland describes the pain as sore and achy but if Angie Copeland bears any weight on her left lower extremity, Angie Copeland describes the pain as sharp and stabbing.  The pain can radiate from her back all the way down to her left foot.  Angie Copeland reports her left leg feels heavy.  Angie Copeland reports associated numbness, tingling and weakness in the left lower extremity.  Angie Copeland denies loss of bowel or bladder control.  Angie Copeland denies recent injury and has never had back surgery.  Angie Copeland has tried Tylenol OTC with minimal relief of symptoms.  HPI  Past Medical History:  Diagnosis Date   Allergy    DDD (degenerative disc disease), lumbar    Esophageal mass 2015   Found incidently on CXR- Saw ENT and for visualization and nothing was there.    GERD (gastroesophageal reflux disease)    Hyperlipidemia    Osteoporosis     Patient Active Problem List   Diagnosis Date Noted   COVID-19 05/14/2022   Family history of colon cancer    Cecal polyp    Polyp of ascending colon    Vitamin D deficiency 12/19/2016   Advance directive discussed with patient 12/17/2016   Rosacea 10/15/2016   Allergic rhinitis 10/15/2016   GE reflux 10/15/2016   Hyperlipidemia, unspecified 10/15/2016   Osteoporosis 10/15/2016   Fever blister 10/15/2016   Anxiety 10/15/2016   DDD (degenerative disc disease), lumbar 01/20/2012    Past Surgical History:  Procedure Laterality Date   COLONOSCOPY WITH PROPOFOL N/A 02/06/2021   Procedure: COLONOSCOPY WITH PROPOFOL;  Surgeon: Pasty Spillers, MD;  Location: [facility] END[facility];  Service: Endoscopy;  Laterality: N/A;   EYE  SURGERY     cataract surgery    NOSE SURGERY     Cartilage removed from left side of nose    OB History   No obstetric history on file.      Home Medications    Prior to Admission medications   Medication Sig Start Date End Date Taking? Authorizing Provider  cholecalciferol (VITAMIN D3) 25 MCG (1000 UNIT) tablet Take 1,000 Units by mouth daily.   Yes [provider]  predniSONE (DELTASONE) 10 MG tablet Take 3 tabs on days 1-3, 2 tabs on days 4-6, 1 tab on days 7-9 01/11/23  Yes Cleave Ternes, Salvadore Oxford, NP  diclofenac Sodium (VOLTAREN) 1 % GEL APPLY 4 GRAMS TOPICALLY TO THE AFFECTED AREA FOUR TIMES DAILY 06/20/22   Johnson, Megan P, DO  Glycerin-Polysorbate 80 (REFRESH DRY EYE THERAPY OP) Apply to eye.    [provider]  triamcinolone (KENALOG) 0.025 % cream Apply topically. 08/30/22   [provider]    Family History Family History  Problem Relation Age of Onset   Heart disease Mother    Diabetes Mother    Hiatal hernia Mother    Arthritis Mother    Cancer Father 52       Colon   Colitis Sister    Irritable bowel syndrome Sister    Arthritis Sister  Diabetes Brother    Stroke Maternal Grandmother    Pneumonia Maternal Grandmother    Kidney failure Paternal Grandmother    Leukemia Paternal Grandfather    Cancer Paternal Grandfather        Leukemia   Heart disease Sister    Irregular heart beat Sister    Thyroid disease Sister     Social History Social History   Tobacco Use   Smoking status: Former    Types: Cigarettes    Quit date: 07/09/1971    Years since quitting: 51.5   Smokeless tobacco: Never  Vaping Use   Vaping Use: Never used  Substance Use Topics   Alcohol use: Yes    Comment: wine   Drug use: No     Allergies   Penicillins   Review of Systems Review of Systems  Respiratory:  Negative for cough and shortness of breath.   Cardiovascular:  Negative for chest pain and leg swelling.  Gastrointestinal: Negative.    Genitourinary: Negative.   Musculoskeletal:  Positive for back pain and gait problem.  Neurological:  Positive for weakness and numbness.     Physical Exam Triage Vital Signs ED Triage Vitals  Enc Vitals Group     BP 01/11/23 1315 (!) 160/59     Pulse Rate 01/11/23 1315 68     Resp 01/11/23 1315 17     Temp 01/11/23 1315 (!) 97.5 F (36.4 C)     Temp Source 01/11/23 1315 Oral     SpO2 01/11/23 1315 100 %     Weight 01/11/23 1311 155 lb (70.3 kg)     Height 01/11/23 1311 5\' 3"  (1.6 m)     Head Circumference --      Peak Flow --      Pain Score 01/11/23 1311 8     Pain Loc --      Pain Edu? --      Excl. in GC? --    No data found.  Updated Vital Signs BP (!) 160/59 (BP Location: Left Arm)   Pulse 68   Temp (!) 97.5 F (36.4 C) (Oral)   Resp 17   Ht 5\' 3"  (1.6 m)   Wt 155 lb (70.3 kg)   SpO2 100%   BMI 27.46 kg/m   Visual Acuity Right Eye Distance:   Left Eye Distance:   Bilateral Distance:    Right Eye Near:   Left Eye Near:    Bilateral Near:     Physical Exam Constitutional:      General: Angie Copeland is not in acute distress. HENT:     Head: Normocephalic.  Cardiovascular:     Rate and Rhythm: Normal rate and regular rhythm.     Heart sounds: Normal heart sounds.     Comments: Pedal pulse 1+ on the left. Pulmonary:     Effort: Pulmonary effort is normal.     Breath sounds: Normal breath sounds.  Musculoskeletal:     Comments: Decreased flexion of the spine but that is because Angie Copeland does not want to bear weight on her left lower extremity.  Normal extension and rotation of the spine.  Bony tenderness noted over the sacrum.  No pain noted over the left SI joint.  Decreased internal and external rotation of the left hip.  Pain with palpation over the left trochanter.  Normal flexion and extension of the left knee.  Joint enlargement noted of the left knee but no pain with palpation.  Normal flexion, extension and rotation  of the left ankle.  No pain with palpation  of the Achilles tendon or the heel.  Limping gait with use of walker.  Angie Copeland is unable to stand on tiptoes or heels.  Strength 5/5 RLE.  Strength 3/5 LLE.  Skin:    General: Skin is warm and dry.     Capillary Refill: Capillary refill takes 2 to 3 seconds.  Neurological:     Mental Status: Angie Copeland is alert and oriented to person, place, and time.     Motor: Weakness present.     Gait: Gait abnormal.      UC Treatments / Results  Labs  EKG   Radiology  Imaging Orders         DG Lumbar Spine Complete         DG Hip Unilat W or Wo Pelvis 2-3 Views Left         DG Knee Complete 4 Views Left       Medications Ordered in UC Medications - No data to display  Initial Impression / Assessment and Plan / UC Course  I have reviewed the triage vital signs and the nursing notes.  Pertinent labs & imaging results that were available during my care of the patient were reviewed by me and considered in my medical decision making (see chart for details).     72 year old with acute on chronic low back pain with associated left hip, left knee and left foot pain.  DDx include osteoarthritis versus sciatica.  X-ray lumbar spine showed mild osteoarthritic changes with significant demineralization but this would be expected given her known history of osteoporosis.  X-ray left hip does not show any acute findings.  X-ray left knee does not show any evidence of acute findings.  At this point, will treat for a presumed sciatica with prednisone.  Angie Copeland is requesting an injection in her knee today, advised her that this is not something we do at urgent care.  Encouraged heat and stretching.  Advised her to follow-up with her PCP early next week if symptoms persist or worsen. Final Clinical Impressions(s) / UC Diagnoses   Final diagnoses:  Chronic left-sided low back pain with left-sided sciatica     Discharge Instructions      You were seen today for left hip, left knee and left foot pain.  The x-ray of  your lumbar spine did show some arthritic changes as well as osteoporosis.  The x-ray of your left hip and left knee are normal.  We are putting you on steroids for 9 days for possible sciatica.  Heat and massage may also be helpful.  Please follow-up with your PCP if symptoms persist or worsen.     ED Prescriptions     Medication Sig Dispense Auth. Provider   predniSONE (DELTASONE) 10 MG tablet Take 3 tabs on days 1-3, 2 tabs on days 4-6, 1 tab on days 7-9 18 tablet Nattie Lazenby, Salvadore Oxford, NP      PDMP not reviewed this encounter.   Lorre Munroe, NP 01/11/23 [phone]

## 2023-01-11 NOTE — ED Triage Notes (Addendum)
Pt reports left leg pain x one week. Pt states if she puts pressure on her left foot or heel the pain shoots up her leg. Sometimes the pain is in her lower leg, sometimes in her knee and sometimes shoots all the way to her left hip. Limping gait with walker in triage. Sometimes feels like pins and needles

## 2023-01-23 ENCOUNTER — Telehealth: Payer: Self-pay | Admitting: Family Medicine

## 2023-01-23 NOTE — Telephone Encounter (Signed)
Please call and schedule visit per Jolene to assess patient and determine next steps.

## 2023-01-23 NOTE — Telephone Encounter (Signed)
Routing to provider to advise.  

## 2023-01-23 NOTE — Telephone Encounter (Signed)
Appointment has been made

## 2023-01-23 NOTE — Telephone Encounter (Signed)
Pt is calling in because she was seen at Urgent Care about a week ago for her knee and was given prednisone. Pt says since visiting UC her knee hasn't gotten better and she is wondering if she may need physical therapy for her knee. Pt says she would like Dr. Henriette Combs opinion on next steps and what she should do. Please follow up with pt.

## 2023-01-25 NOTE — Patient Instructions (Signed)
Acute Knee Pain, Adult Many things can cause knee pain. Sometimes, knee pain is sudden (acute) and may be caused by damage, swelling, or irritation of the muscles and tissues that support your knee. The pain often goes away on its own with time and rest. If the pain does not go away, tests may be done to find out what is causing the pain. Follow these instructions at home: If you have a knee sleeve or brace:  Wear the knee sleeve or brace as told by your doctor. Take it off only as told by your doctor. Loosen it if your toes: Tingle. Become numb. Turn cold and blue. Keep it clean. If the knee sleeve or brace is not waterproof: Do not let it get wet. Cover it with a watertight covering when you take a bath or shower. Activity Rest your knee. Do not do things that cause pain or make pain worse. Avoid activities where both feet leave the ground at the same time (high-impact activities). Examples are running, jumping rope, and doing jumping jacks. Work with a physical therapist to make a safe exercise program, as told by your doctor. Managing pain, stiffness, and swelling  If told, put ice on the knee. To do this: If you have a removable knee sleeve or brace, take it off as told by your doctor. Put ice in a plastic bag. Place a towel between your skin and the bag. Leave the ice on for 20 minutes, 2-3 times a day. Take off the ice if your skin turns bright red. This is very important. If you cannot feel pain, heat, or cold, you have a greater risk of damage to the area. If told, use an elastic bandage to put pressure (compression) on your injured knee. Raise your knee above the level of your heart while you are sitting or lying down. Sleep with a pillow under your knee. General instructions Take over-the-counter and prescription medicines only as told by your doctor. Do not smoke or use any products that contain nicotine or tobacco. If you need help quitting, ask your doctor. If you are  overweight, work with your doctor and a food expert (dietitian) to set goals to lose weight. Being overweight can make your knee hurt more. Watch for any changes in your symptoms. Keep all follow-up visits. Contact a doctor if: The knee pain does not stop. The knee pain changes or gets worse. You have a fever along with knee pain. Your knee is red or feels warm when you touch it. Your knee gives out or locks up. Get help right away if: Your knee swells, and the swelling gets worse. You cannot move your knee. You have very bad knee pain that does not get better with pain medicine. Summary Many things can cause knee pain. The pain often goes away on its own with time and rest. Your doctor may do tests to find out the cause of the pain. Watch for any changes in your symptoms. Relieve your pain with rest, medicines, light activity, and use of ice. Get help right away if you cannot move your knee or your knee pain is very bad. This information is not intended to replace advice given to you by your health care provider. Make sure you discuss any questions you have with your health care provider. Document Revised: 12/07/2019 Document Reviewed: 12/08/2019 Elsevier Patient Education  2024 [employer]  

## 2023-01-27 ENCOUNTER — Encounter: Payer: Self-pay | Admitting: Nurse Practitioner

## 2023-01-27 ENCOUNTER — Ambulatory Visit: Payer: Medicare PPO | Admitting: Nurse Practitioner

## 2023-01-27 VITALS — BP 138/70 | HR 79 | Temp 97.9°F | Ht 63.11 in | Wt 159.8 lb

## 2023-01-27 DIAGNOSIS — M25562 Pain in left knee: Secondary | ICD-10-CM | POA: Diagnosis not present

## 2023-01-27 MED ORDER — MELOXICAM 7.5 MG PO TABS: 7.5000 mg | ORAL_TABLET | Freq: Every day | ORAL | 0 refills | Status: DC

## 2023-01-27 NOTE — Assessment & Plan Note (Signed)
Ongoing for weeks, imaging in UC was reassuring however she doe have ongoing pain.  Will start Meloxicam 7.5 MG daily, stop Aleeve, and start Tylenol 1000 MG TID PRN.  Use ice as needed to knee for swelling and recommend use of knee sleeve for comfort.  Will get into ortho for further assessment and possible PT time.

## 2023-01-27 NOTE — Progress Notes (Signed)
BP 138/70 (BP Location: Left Arm, Patient Position: Sitting)   Pulse 79   Temp 97.9 F (36.6 C)   Ht 5' 3.11" (1.603 m)   Wt 159 lb 12.8 oz (72.5 kg)   SpO2 99%   BMI 28.21 kg/m    Subjective:    Patient ID: Angie Copeland, female    DOB: 1950/12/26, 72 y.o.   MRN: [id number]  HPI: Angie Copeland is a 72 y.o. female  Chief Complaint  Patient presents with   Knee Pain    Left knee pain, was seen in UC about 2 weeks ago, patient states that the medial side of knee is warm to the touch   KNEE PAIN Left knee pain ongoing. Went to urgent care 01/11/23 with imaging done noting no acute issues with knee or left hip. Mild degenerative changes lower back and mild scoliosis.  Was given Prednisone taper from UC, which offered benefit for period.  Takes Aleeve at night for pain.  Has been using walker at home to assist with movement. Duration: weeks Involved knee: left Mechanism of injury: unknown Location:diffuse -- more medial aspect Onset: sudden Severity: 6/10  Quality:  sharp, dull, aching, and throbbing -- if moves certain way is sharp Frequency: constant Radiation: no Aggravating factors: weight bearing, walking, stairs, bending, and movement  Alleviating factors:  Aleeve   Status: fluctuating Treatments attempted: Voltaren, Aleeve  Relief with NSAIDs?:  moderate Weakness with weight bearing or walking: yes Sensation of giving way: no Locking: no Popping: yes Bruising: no Swelling: yes Redness: no Paresthesias/decreased sensation: no Fevers: no   Relevant past medical, surgical, family and social history reviewed and updated as indicated. Interim medical history since our last visit reviewed. Allergies and medications reviewed and updated.  Review of Systems  Constitutional:  Negative for activity change, appetite change, diaphoresis, fatigue and fever.  Respiratory:  Negative for cough, chest tightness and shortness of breath.   Cardiovascular:  Negative for chest pain,  palpitations and leg swelling.  Musculoskeletal:  Positive for arthralgias.  Neurological: Negative.   Psychiatric/Behavioral: Negative.      Per HPI unless specifically indicated above     Objective:    BP 138/70 (BP Location: Left Arm, Patient Position: Sitting)   Pulse 79   Temp 97.9 F (36.6 C)   Ht 5' 3.11" (1.603 m)   Wt 159 lb 12.8 oz (72.5 kg)   SpO2 99%   BMI 28.21 kg/m   Wt Readings from Last 3 Encounters:  01/27/23 159 lb 12.8 oz (72.5 kg)  01/11/23 155 lb (70.3 kg)  11/07/22 161 lb 8 oz (73.3 kg)    Physical Exam Vitals and nursing note reviewed.  Constitutional:      General: She is awake. She is not in acute distress.    Appearance: She is well-developed and well-groomed. She is obese. She is not ill-appearing or toxic-appearing.  HENT:     Head: Normocephalic.     Right Ear: Hearing and external ear normal.     Left Ear: Hearing and external ear normal.  Eyes:     General: Lids are normal.        Right eye: No discharge.        Left eye: No discharge.     Conjunctiva/sclera: Conjunctivae normal.     Pupils: Pupils are equal, round, and reactive to light.  Neck:     Thyroid: No thyromegaly.     Vascular: No carotid bruit.  Cardiovascular:  Rate and Rhythm: Normal rate and regular rhythm.     Heart sounds: Normal heart sounds. No murmur heard.    No gallop.  Pulmonary:     Effort: Pulmonary effort is normal. No accessory muscle usage or respiratory distress.     Breath sounds: Normal breath sounds.  Abdominal:     General: Bowel sounds are normal. There is no distension.     Palpations: Abdomen is soft.     Tenderness: There is no abdominal tenderness.  Musculoskeletal:     Cervical back: Normal range of motion and neck supple.     Right knee: Crepitus present. No swelling. Normal range of motion. Normal pulse.     Left knee: Swelling (mild) and crepitus present. No erythema, ecchymosis or bony tenderness. Decreased range of motion. Tenderness  present over the medial joint line. Normal alignment and normal meniscus. Normal pulse.     Instability Tests: Anterior drawer test negative. Posterior drawer test negative. Medial McMurray test negative and lateral McMurray test negative.     Right lower leg: No edema.     Left lower leg: No edema.     Comments: Antalgic gait  Lymphadenopathy:     Cervical: No cervical adenopathy.  Skin:    General: Skin is warm and dry.  Neurological:     Mental Status: She is alert and oriented to person, place, and time.     Deep Tendon Reflexes: Reflexes are normal and symmetric.     Reflex Scores:      Brachioradialis reflexes are 2+ on the right side and 2+ on the left side.      Patellar reflexes are 2+ on the right side and 2+ on the left side. Psychiatric:        Attention and Perception: Attention normal.        Mood and Affect: Mood normal.        Speech: Speech normal.        Behavior: Behavior normal. Behavior is cooperative.        Thought Content: Thought content normal.     Results for orders placed or performed in visit on 11/07/22  Comprehensive metabolic panel  Result Value Ref Range   Glucose 84 70 - 99 mg/dL   BUN 16 8 - 27 mg/dL   Creatinine, Ser 0.98 0.57 - 1.00 mg/dL   eGFR 74 >11 BJ/YNW/2.95   BUN/Creatinine Ratio 19 12 - 28   Sodium 141 134 - 144 mmol/L   Potassium 4.2 3.5 - 5.2 mmol/L   Chloride 102 96 - 106 mmol/L   CO2 22 20 - 29 mmol/L   Calcium 9.5 8.7 - 10.3 mg/dL   Total Protein 6.7 6.0 - 8.5 g/dL   Albumin 4.6 3.8 - 4.8 g/dL   Globulin, Total 2.1 1.5 - 4.5 g/dL   Albumin/Globulin Ratio 2.2 1.2 - 2.2   Bilirubin Total 0.5 0.0 - 1.2 mg/dL   Alkaline Phosphatase 58 44 - 121 IU/L   AST 23 0 - 40 IU/L   ALT 14 0 - 32 IU/L  CBC with Differential/Platelet  Result Value Ref Range   WBC 4.2 3.4 - 10.8 x10E3/uL   RBC 4.68 3.77 - 5.28 x10E6/uL   Hemoglobin 14.3 11.1 - 15.9 g/dL   Hematocrit 62.1 30.8 - 46.6 %   MCV 92 79 - 97 fL   MCH 30.6 26.6 - 33.0 pg    MCHC 33.1 31.5 - 35.7 g/dL   RDW 65.7 84.6 - 96.2 %  Platelets 207 150 - 450 x10E3/uL   Neutrophils 49 Not Estab. %   Lymphs 39 Not Estab. %   Monocytes 9 Not Estab. %   Eos 2 Not Estab. %   Basos 1 Not Estab. %   Neutrophils Absolute 2.0 1.4 - 7.0 x10E3/uL   Lymphocytes Absolute 1.6 0.7 - 3.1 x10E3/uL   Monocytes Absolute 0.4 0.1 - 0.9 x10E3/uL   EOS (ABSOLUTE) 0.1 0.0 - 0.4 x10E3/uL   Basophils Absolute 0.1 0.0 - 0.2 x10E3/uL   Immature Granulocytes 0 Not Estab. %   Immature Grans (Abs) 0.0 0.0 - 0.1 x10E3/uL  Lipid Panel w/o Chol/HDL Ratio  Result Value Ref Range   Cholesterol, Total 267 (H) 100 - 199 mg/dL   Triglycerides 55 0 - 149 mg/dL   HDL 91 >82 mg/dL   VLDL Cholesterol Cal 8 5 - 40 mg/dL   LDL Chol Calc (NIH) 956 (H) 0 - 99 mg/dL  VITAMIN D 25 Hydroxy (Vit-D Deficiency, Fractures)  Result Value Ref Range   Vit D, 25-Hydroxy 25.9 (L) 30.0 - 100.0 ng/mL      Assessment & Plan:   Problem List Items Addressed This Visit       Other   Acute pain of left knee - Primary    Ongoing for weeks, imaging in UC was reassuring however she doe have ongoing pain.  Will start Meloxicam 7.5 MG daily, stop Aleeve, and start Tylenol 1000 MG TID PRN.  Use ice as needed to knee for swelling and recommend use of knee sleeve for comfort.  Will get into ortho for further assessment and possible PT time.        Relevant Orders   Ambulatory referral to Orthopedic Surgery     Follow up plan: Return if symptoms worsen or fail to improve.

## 2023-01-30 ENCOUNTER — Telehealth: Payer: Self-pay | Admitting: Family Medicine

## 2023-01-30 NOTE — Telephone Encounter (Signed)
Placed form in provider folder to be signed

## 2023-01-30 NOTE — Telephone Encounter (Signed)
Copied from CRM [id number]. Topic: General - Inquiry >> Jan 29, 2023  2:33 PM Patsy Lager T wrote: Reason for CRM: patient called stated she is having issues with her knee and have an appt with Ortho on 8/2 but she needs a handicap plaque. Please f/u with patient

## 2023-01-30 NOTE — Telephone Encounter (Signed)
Form is signed and placed in completed bin to be picked up by patient.

## 2023-02-07 DIAGNOSIS — S83412A Sprain of medial collateral ligament of left knee, initial encounter: Secondary | ICD-10-CM | POA: Diagnosis not present

## 2023-02-07 DIAGNOSIS — M2242 Chondromalacia patellae, left knee: Secondary | ICD-10-CM | POA: Diagnosis not present

## 2023-02-12 ENCOUNTER — Ambulatory Visit: Payer: Medicare PPO

## 2023-02-12 DIAGNOSIS — M6281 Muscle weakness (generalized): Secondary | ICD-10-CM | POA: Insufficient documentation

## 2023-02-12 DIAGNOSIS — M25562 Pain in left knee: Secondary | ICD-10-CM | POA: Diagnosis not present

## 2023-02-12 DIAGNOSIS — G8929 Other chronic pain: Secondary | ICD-10-CM | POA: Insufficient documentation

## 2023-02-12 NOTE — Therapy (Cosign Needed)
OUTPATIENT PHYSICAL THERAPY THORACOLUMBAR EVALUATION   Patient Name: Angie Copeland MRN: [id number] DOB:[birth year], 72 y.o., female Today's Date: 02/13/2023  END OF SESSION:  PT End of Session - 02/12/23 0829     Visit Number 1    Number of Visits 9    Date for PT Re-Evaluation 04/09/23    PT Start Time 0845    PT Stop Time 0930    PT Time Calculation (min) 45 min    Activity Tolerance Patient tolerated treatment well    Behavior During Therapy [facility] for tasks assessed/performed            Past Medical History:  Diagnosis Date   Allergy    DDD (degenerative disc disease), lumbar    Esophageal mass 2015   Found incidently on CXR- Saw ENT and for visualization and nothing was there.    GERD (gastroesophageal reflux disease)    Hyperlipidemia    Osteoporosis    Past Surgical History:  Procedure Laterality Date   COLONOSCOPY WITH PROPOFOL N/A 02/06/2021   Procedure: COLONOSCOPY WITH PROPOFOL;  Surgeon: Pasty Spillers, MD;  Location: [facility] ENDOSCOPY;  Service: Endoscopy;  Laterality: N/A;   EYE SURGERY     cataract surgery    NOSE SURGERY     Cartilage removed from left side of nose   Patient Active Problem List   Diagnosis Date Noted   Acute pain of left knee 01/27/2023   Family history of colon cancer    Cecal polyp    Polyp of ascending colon    Vitamin D deficiency 12/19/2016   Advance directive discussed with patient 12/17/2016   Rosacea 10/15/2016   Allergic rhinitis 10/15/2016   GE reflux 10/15/2016   Hyperlipidemia, unspecified 10/15/2016   Osteoporosis 10/15/2016   Fever blister 10/15/2016   Anxiety 10/15/2016   DDD (degenerative disc disease), lumbar 01/20/2012    PCP: Dorcas Carrow, DO  REFERRING PROVIDER: Juanell Fairly, MD  REFERRING DIAG: Sprain of Medial Collateral Ligament of Left Knee V78.469G; Chondromalacia Patellae, Left Knee M22.42  RATIONALE FOR EVALUATION AND TREATMENT: Rehabilitation  THERAPY DIAG: Chronic pain of left  knee  ONSET DATE: 01/11/2023  FOLLOW-UP APPT SCHEDULED WITH REFERRING PROVIDER:  Did Not Address   SUBJECTIVE:                                                                                                                                                                                         SUBJECTIVE STATEMENT: "I've been having pain in my left knee following a bad step on the stairs. My doctor has mentioned that I have sprained the ligament in my left knee."  PERTINENT HISTORY:  Patient reports walking down the steps 01/11/2023, "I stepped wrong and I had this shooting pain in my left leg. A history of lumbar DDD, low back pain, left hip pain, left knee pain and left foot pain. The pain can radiate from her back all the way down to her left foot. She reports associated numbness, tingling and weakness in the left lower extremity. She reports that walking on harder surfaces (I.e. the store, cement, etc) for prolonged time. She denies loss of bowel or bladder control. She denies recent injury and has never had back surgery.  She has tried Tylenol OTC with minimal relief of symptoms.   PAIN:    Pain Intensity: Present: 5/10, Best: 2/10, Worst: 9/10 Pain location: Left Anteromedial Pain  Pain Quality: aching  Radiating: Yes, radiates superior to the hip  Numbness/Tingling: Yes, bilateral feet   Focal Weakness: Yes Pt reports "knee feels like it will give out in the shower"  Aggravating factors: "Harder Floors", Prolonged Walking Relieving factors: Rest, Elevation, Massage 24-hour pain behavior: Increased pain with prolonged activities throughout the day.  History of prior back injury, pain, surgery, or therapy: Yes Dominant hand: right Imaging: No  Red flags: Negative for bowel/bladder changes, saddle paresthesia, personal history of cancer, h/o spinal tumors, h/o compression fx, h/o abdominal aneurysm, abdominal pain, chills/fever, night sweats, nausea, vomiting, unrelenting  pain,  PRECAUTIONS: None  WEIGHT BEARING RESTRICTIONS: No  FALLS: Has patient fallen in last 6 months? No  Living Environment Lives with: lives alone Lives in: House/apartment Stairs: Yes: Internal: 12 steps; on left going up Has following equipment at home: Dan Humphreys - 2 wheeled  Prior level of function: Independent  Occupational demands: Retired  Presenter, broadcasting: Social research officer, government, Walking with friend (30 min), Chair Yoga   Patient Goals: Patient would like to walk without pain and return to walking with my friend.    OBJECTIVE:  Patient Surveys  FOTO 40%; Predicted Value 59%  Cognition Patient is oriented to person, place, and time.  Recent memory is intact.  Remote memory is intact.  Attention span and concentration are intact.  Expressive speech is intact.  Patient's fund of knowledge is within normal limits for educational level.    Gross Musculoskeletal Assessment Tremor: None Tone: Normal Noted minor swelling and slightly darker skin tone in medial LLE.   GAIT: Distance walked: 10 Assistive device utilized: None Level of assistance: Complete Independence Comments: No gross abnormalities noted at the ankle and knee joint; decreased trunk rotation.   Posture: Lumbar lordosis: WNL Iliac crest height: Equal bilaterally Lumbar lateral shift: Negative  AROM AROM (Normal range in degrees) AROM   Lumbar   Flexion (65)   Extension (30)   Right lateral flexion (25)   Left lateral flexion (25)   Right rotation (30)   Left rotation (30)       Hip Right Left  Flexion (125) WNL WNL  Extension (15) WNL WNL  Abduction (40)    Adduction     Internal Rotation (45)    External Rotation (45)        Knee    Flexion (135) WNL WNL  Extension (0) WNL* WNL      Ankle    Dorsiflexion (20)    Plantarflexion (50)    Inversion (35)    Eversion (15)    (* = pain; Blank rows = not tested)  LE MMT: MMT (out of 5) Right  Left   Hip flexion 4- 4-  Hip extension  Hip  abduction 4- 4-  Hip adduction 4- 4-  Hip internal rotation 4 4  Hip external rotation 4 4-*  Knee flexion 4 4  Knee extension 5 5  Ankle dorsiflexion 5 5  Ankle plantarflexion 5 5  Ankle inversion    Ankle eversion    (* = pain; Blank rows = not tested)  Sensation Grossly intact to light touch throughout bilateral LEs as determined by testing dermatomes L2-S2. Proprioception, stereognosis, and hot/cold testing deferred on this date.  Reflexes Deferred  Muscle Length Deferred  Palpation Location LEFT  RIGHT           Quadriceps    Medial Hamstrings    Lateral Hamstrings    Lateral Hamstring tendon    Medial Hamstring tendon    Quadriceps tendon    Patella    Patellar Tendon    Tibial Tuberosity    Medial joint line    Lateral joint line    MCL    LCL    Adductor Tubercle    Pes Anserine tendon    Infrapatellar fat pad    Fibular head    Popliteal fossa    (Blank rows = not tested) Graded on 0-4 scale (0 = no pain, 1 = pain, 2 = pain with wincing/grimacing/flinching, 3 = pain with withdrawal, 4 = unwilling to allow palpation), (Blank rows = not tested)  Passive Accessory Motion Deferred to next session  VASCULAR  SPECIAL TESTS  Ligamentous Stability   MCL: Valgus Stress (30 degrees flexion): R: Not done L: Not done  LCL: Varus Stress (30 degrees flexion): R: Not done L: Not done  Meniscus Tests McMurray's Test:  Medial Meniscus (Tibial ER): R: Not done L: Not done Lateral Meniscus (Tibial IR): R: Not done L: Not done  [facility]: R: Not done L: Not done Steinmann Sign I: R: Not done L: Not done Steinmann Sign II: R: Not done L: Not done Effusion: R: Not done L: Not done   Patellofemoral Pain Syndrome Patellar Tilt (Lateral): R: Not done L: Not done Squatting pain: R: Not done L: Not done Stair climbing pain: R: Not done L: Not done Kneeling pain: R: Not done L: Not done Resisted knee extension pain: R: Not done L: Not done Compression: R: Not  done L: Not done Clarke's sign: R: Not done L: Not done Lateral Pull: R: Not done L: Not done  Patellar Tendinopathy Inferior pole palpation with anterior tilt: R: Not done L: Not done   Beighton Scale  LEFT  RIGHT           1. Passive dorsiflexion and hyperextension of the fifth MCP joint beyond 90  0 0   2. Passive apposition of the thumb to the flexor aspect of the forearm  0  0   3. Passive hyperextension of the elbow beyond 10  0  0   4. Passive hyperextension of the knee beyond 10  0  0   5. Active forward flexion of the trunk with the knees fully extended so that the palms of the hands rest flat on the floor   0   TOTAL         0/ 9    [place] Knee Rules for Acute Knee injury  Yes/No         1. Aged 55 years or over Yes  2. Tenderness at the head of the fibula Not tested   3. Isolated tenderness of the patella  No  4.  Inability to flex knee to 90 degrees No  5. Inability to bear weight (defined as an inability to take 4 steps, ie two steps on each leg, regardless of limping) immediately and at presentation No     TODAY'S TREATMENT: DATE: 02/12/2023  Initial Evaluation    PATIENT EDUCATION:  Education details: Plan of Care  Person educated: Patient Education method: Explanation Education comprehension: verbalized understanding   HOME EXERCISE PROGRAM:  Deferred to next session   ASSESSMENT:  CLINICAL IMPRESSION: Patient is a 72 y.o. female who was seen today for physical therapy evaluation and treatment for left knee pain. Patient reported chronic knee pain but it has been significantly getting worse with prolonged standing. Currently, the pain limits her ability to walk prolonged distances "more than 30 min", garden or rest with her knee completely straightened. No notable differences between BLE in ROM and MMT. Patient demonstrates good strength but muscles fatigue and cramp easily. Palpation to medial knee presented with minor pain "soreness". Patient does  report numbness in LLE in non-WB short sitting position; improves with weight bearing on LE. PT recommends 1x/week skilled PT services to address deficits in strength, balance, and mobility in order to return to full function at home.   OBJECTIVE IMPAIRMENTS: decreased activity tolerance, decreased endurance, difficulty walking, decreased strength, and pain.   ACTIVITY LIMITATIONS: bending, standing, squatting, and stairs  PARTICIPATION LIMITATIONS: shopping and yard work  PERSONAL FACTORS: Age, Fitness, Past/current experiences, and 3+ comorbidities: DDD, GERD, HLD, OP are also affecting patient's functional outcome.   REHAB POTENTIAL: Good  CLINICAL DECISION MAKING: Evolving/moderate complexity  EVALUATION COMPLEXITY: Moderate   GOALS: Goals reviewed with patient? Yes  SHORT TERM GOALS: Target date: 03/26/2023  Pt will be independent with HEP to improve strength and decrease knee pain to improve pain-free function at home and work. Baseline: Deferred Goal status: INITIAL   LONG TERM GOALS: Target date: 04/09/2023  Pt will increase FOTO to at least 59 to demonstrate significant improvement in function at home and work related to knee pain  Baseline: Eval: 40% Goal status: INITIAL  2.  Pt will decrease worst knee pain by at least 3 points on the NPRS in order to demonstrate clinically significant reduction in back pain. Baseline: Eval: 9/10 Goal status: INITIAL  3.  Pt will increase strength of BLE hip abductors by at least 1/2 MMT grade in order to demonstrate improvement in strength and function  Baseline: Eval: 4- Goal status: INITIAL   PLAN: PT FREQUENCY: 1-2x/week  PT DURATION: 8 weeks  PLANNED INTERVENTIONS: Therapeutic exercises, Therapeutic activity, Neuromuscular re-education, Balance training, Gait training, Patient/Family education, Self Care, Joint mobilization, Joint manipulation, Vestibular training, Canalith repositioning, Orthotic/Fit training, DME  instructions, Dry Needling, Electrical stimulation, Spinal manipulation, Spinal mobilization, Cryotherapy, Moist heat, Taping, Traction, Ultrasound, Ionotophoresis 4mg /ml Dexamethasone, Manual therapy, and Re-evaluation.  PLAN FOR NEXT SESSION: PAM, Knee ST, Palpation, Provide HEP   Alyia Lacerte, SPT Jason D Huprich PT, DPT, [facility]

## 2023-02-13 NOTE — Addendum Note (Signed)
Addended by: Ria Comment D on: 02/13/2023 01:05 PM   Modules accepted: Orders

## 2023-02-20 ENCOUNTER — Ambulatory Visit: Payer: Medicare PPO

## 2023-02-20 DIAGNOSIS — M25562 Pain in left knee: Secondary | ICD-10-CM | POA: Diagnosis not present

## 2023-02-20 DIAGNOSIS — M6281 Muscle weakness (generalized): Secondary | ICD-10-CM

## 2023-02-20 DIAGNOSIS — G8929 Other chronic pain: Secondary | ICD-10-CM | POA: Diagnosis not present

## 2023-02-20 NOTE — Therapy (Addendum)
OUTPATIENT PHYSICAL THERAPY THORACOLUMBAR TREATMENT   Patient Name: Angie Copeland MRN: [id number] DOB:1951/01/30, 72 y.o., female Today's Date: 02/20/2023  END OF SESSION:  PT End of Session - 02/20/23 1401     Visit Number 2    Number of Visits 9    Date for PT Re-Evaluation 04/09/23    PT Start Time 1405    PT Stop Time 1445    PT Time Calculation (min) 40 min    Activity Tolerance Patient tolerated treatment well    Behavior During Therapy [facility] for tasks assessed/performed             Past Medical History:  Diagnosis Date   Allergy    DDD (degenerative disc disease), lumbar    Esophageal mass 2015   Found incidently on CXR- Saw ENT and for visualization and nothing was there.    GERD (gastroesophageal reflux disease)    Hyperlipidemia    Osteoporosis    Past Surgical History:  Procedure Laterality Date   COLONOSCOPY WITH PROPOFOL N/A 02/06/2021   Procedure: COLONOSCOPY WITH PROPOFOL;  Surgeon: Pasty Spillers, MD;  Location: [facility] ENDOSCOPY;  Service: Endoscopy;  Laterality: N/A;   EYE SURGERY     cataract surgery    NOSE SURGERY     Cartilage removed from left side of nose   Patient Active Problem List   Diagnosis Date Noted   Acute pain of left knee 01/27/2023   Family history of colon cancer    Cecal polyp    Polyp of ascending colon    Vitamin D deficiency 12/19/2016   Advance directive discussed with patient 12/17/2016   Rosacea 10/15/2016   Allergic rhinitis 10/15/2016   GE reflux 10/15/2016   Hyperlipidemia, unspecified 10/15/2016   Osteoporosis 10/15/2016   Fever blister 10/15/2016   Anxiety 10/15/2016   DDD (degenerative disc disease), lumbar 01/20/2012    PCP: Dorcas Carrow, DO  REFERRING PROVIDER: Juanell Fairly, MD  REFERRING DIAG: Sprain of Medial Collateral Ligament of Left Knee H08.657Q; Chondromalacia Patellae, Left Knee M22.42  RATIONALE FOR EVALUATION AND TREATMENT: Rehabilitation  THERAPY DIAG: Chronic pain of left  knee  Muscle weakness (generalized)  ONSET DATE: 01/11/2023  FOLLOW-UP APPT SCHEDULED WITH REFERRING PROVIDER:  Did Not Address   SUBJECTIVE:                                                                                                                                                                                         SUBJECTIVE STATEMENT: "I've been having pain in my left knee following a bad step on the stairs. My doctor has mentioned that I have sprained the ligament  in my left knee."  PERTINENT HISTORY:  Patient reports walking down the steps 01/11/2023, "I stepped wrong and I had this shooting pain in my left leg. A history of lumbar DDD, low back pain, left hip pain, left knee pain and left foot pain. The pain can radiate from her back all the way down to her left foot. She reports associated numbness, tingling and weakness in the left lower extremity. She reports that walking on harder surfaces (I.e. the store, cement, etc) for prolonged time. She denies loss of bowel or bladder control. She denies recent injury and has never had back surgery.  She has tried Tylenol OTC with minimal relief of symptoms.   PAIN:    Pain Intensity: Present: 5/10, Best: 2/10, Worst: 9/10 Pain location: Left Anteromedial Pain  Pain Quality: aching  Radiating: Yes, radiates superior to the hip  Numbness/Tingling: Yes, bilateral feet   Focal Weakness: Yes Pt reports "knee feels like it will give out in the shower"  Aggravating factors: "Harder Floors", Prolonged Walking Relieving factors: Rest, Elevation, Massage 24-hour pain behavior: Increased pain with prolonged activities throughout the day.  History of prior back injury, pain, surgery, or therapy: Yes Dominant hand: right Imaging: No  Red flags: Negative for bowel/bladder changes, saddle paresthesia, personal history of cancer, h/o spinal tumors, h/o compression fx, h/o abdominal aneurysm, abdominal pain, chills/fever, night sweats, nausea,  vomiting, unrelenting pain,  PRECAUTIONS: None  WEIGHT BEARING RESTRICTIONS: No  FALLS: Has patient fallen in last 6 months? No  Living Environment Lives with: lives alone Lives in: House/apartment Stairs: Yes: Internal: 12 steps; on left going up Has following equipment at home: Dan Humphreys - 2 wheeled  Prior level of function: Independent  Occupational demands: Retired  Presenter, broadcasting: Social research officer, government, Walking with friend (30 min), Chair Yoga   Patient Goals: Patient would like to walk without pain and return to walking with my friend.    OBJECTIVE:  Patient Surveys  FOTO 40%; Predicted Value 59%  Cognition Patient is oriented to person, place, and time.  Recent memory is intact.  Remote memory is intact.  Attention span and concentration are intact.  Expressive speech is intact.  Patient's fund of knowledge is within normal limits for educational level.    Gross Musculoskeletal Assessment Tremor: None Tone: Normal Noted minor swelling and slightly darker skin tone in medial LLE.   GAIT: Distance walked: 10 Assistive device utilized: None Level of assistance: Complete Independence Comments: No gross abnormalities noted at the ankle and knee joint; decreased trunk rotation.   Posture: Lumbar lordosis: WNL Iliac crest height: Equal bilaterally Lumbar lateral shift: Negative  AROM AROM (Normal range in degrees) AROM   Lumbar   Flexion (65)   Extension (30)   Right lateral flexion (25)   Left lateral flexion (25)   Right rotation (30)   Left rotation (30)       Hip Right Left  Flexion (125) WNL WNL  Extension (15) WNL WNL  Abduction (40)    Adduction     Internal Rotation (45)    External Rotation (45)        Knee    Flexion (135) WNL WNL  Extension (0) WNL* WNL      Ankle    Dorsiflexion (20)    Plantarflexion (50)    Inversion (35)    Eversion (15)    (* = pain; Blank rows = not tested)  LE MMT: MMT (out of 5) Right  Left   Hip flexion 4- 4-  Hip  extension    Hip abduction 4- 4-  Hip adduction 4- 4-  Hip internal rotation 4 4  Hip external rotation 4 4-*  Knee flexion 4 4  Knee extension 5 5  Ankle dorsiflexion 5 5  Ankle plantarflexion 5 5  Ankle inversion    Ankle eversion    (* = pain; Blank rows = not tested)  Sensation Grossly intact to light touch throughout bilateral LEs as determined by testing dermatomes L2-S2. Proprioception, stereognosis, and hot/cold testing deferred on this date.  Reflexes Deferred  Muscle Length Deferred   Palpation Location LEFT  RIGHT           Quadriceps    Medial Hamstrings    Lateral Hamstrings    Lateral Hamstring tendon    Medial Hamstring tendon    Quadriceps tendon    Patella    Patellar Tendon    Tibial Tuberosity    Medial joint line    Lateral joint line    MCL    LCL    Adductor Tubercle    Pes Anserine tendon    Infrapatellar fat pad    Fibular head    Popliteal fossa    (Blank rows = not tested) Graded on 0-4 scale (0 = no pain, 1 = pain, 2 = pain with wincing/grimacing/flinching, 3 = pain with withdrawal, 4 = unwilling to allow palpation), (Blank rows = not tested)  Passive Accessory Motion Deferred to next session  VASCULAR  SPECIAL TESTS  Ligamentous Stability   MCL: Valgus Stress (30 degrees flexion): R: Not done L: Not done  LCL: Varus Stress (30 degrees flexion): R: Not done L: Not done  Meniscus Tests McMurray's Test:  Medial Meniscus (Tibial ER): R: Not done L: Not done Lateral Meniscus (Tibial IR): R: Not done L: Not done  [facility]: R: Not done L: Not done Steinmann Sign I: R: Not done L: Not done Steinmann Sign II: R: Not done L: Not done Effusion: R: Not done L: Not done   Patellofemoral Pain Syndrome Patellar Tilt (Lateral): R: Not done L: Not done Squatting pain: R: Not done L: Not done Stair climbing pain: R: Not done L: Not done Kneeling pain: R: Not done L: Not done Resisted knee extension pain: R: Not done L: Not  done Compression: R: Not done L: Not done Clarke's sign: R: Not done L: Not done Lateral Pull: R: Not done L: Not done  Patellar Tendinopathy Inferior pole palpation with anterior tilt: R: Not done L: Not done   Beighton Scale  LEFT  RIGHT           1. Passive dorsiflexion and hyperextension of the fifth MCP joint beyond 90  0 0   2. Passive apposition of the thumb to the flexor aspect of the forearm  0  0   3. Passive hyperextension of the elbow beyond 10  0  0   4. Passive hyperextension of the knee beyond 10  0  0   5. Active forward flexion of the trunk with the knees fully extended so that the palms of the hands rest flat on the floor   0   TOTAL         0/ 9    [place] Knee Rules for Acute Knee injury  Yes/No         1. Aged 55 years or over Yes  2. Tenderness at the head of the fibula Not tested   3. Isolated tenderness of  the patella  No  4. Inability to flex knee to 90 degrees No  5. Inability to bear weight (defined as an inability to take 4 steps, ie two steps on each leg, regardless of limping) immediately and at presentation No   Ligamentous Stability   MCL: Valgus Stress (30 degrees flexion): R: Negative : Positive with grimace and recoil.   TODAY'S TREATMENT:  Subjective: Patient arrived to physical therapy feeling soreness in the left knee after increased activity during the weekend. No further questions or concerns.    Pain: 2-3/10 in the left knee   Location LEFT  RIGHT           Quadriceps 0   Medial Hamstrings 0   Lateral Hamstrings 0   Lateral Hamstring tendon 0   Medial Hamstring tendon 0   Quadriceps tendon 0   Patella 0   Patellar Tendon 0   Tibial Tuberosity 0   Medial joint line 0   Lateral joint line 0   MCL 2   LCL 0   Adductor Tubercle    Pes Anserine tendon 1   Infrapatellar fat pad    Fibular head    Popliteal fossa 0   (Blank rows = not tested) Graded on 0-4 scale (0 = no pain, 1 = pain, 2 = pain with  wincing/grimacing/flinching, 3 = pain with withdrawal, 4 = unwilling to allow palpation), (Blank rows = not tested)   There-ex: NuStep Level 0-1 x 6 min for warm up and interval history, PT adjusted resistance; Supine Bridge 2 x 10 x 3s hold; Supine Hip Abduction Red TB 2 x 10;  Supine SLR, second set with manual resistance 2 x 10; LAQ 3# AW 2 x 10 x 5s;   Manual Therapy: Manual Hamstring Stretch Left 3 x 60s (unbilled)   PATIENT EDUCATION:  Education details: Pt educated throughout session about proper posture and technique with exercises. Improved exercise technique, movement at target joints, use of target muscles after min to mod verbal, visual, tactile cues. N Person educated: Patient Education method: Explanation Education comprehension: verbalized understanding   HOME EXERCISE PROGRAM:  Access Code: [facility] URL: [email or web address] Date: 02/20/2023 Prepared by: Ria Comment  Exercises - Seated Hamstring Stretch  - 1 x daily - 7 x weekly - 3 sets - 30 hold - Supine Bridge  - 1 x daily - 7 x weekly - 2-3 sets - 10 reps - 3 hold - Hooklying Clamshell with Resistance  - 1 x daily - 7 x weekly - 2-3 sets - 10 reps - 3 hold - Supine Active Straight Leg Raise  - 1 x daily - 7 x weekly - 2-3 sets - 10 reps   ASSESSMENT:  CLINICAL IMPRESSION: Patient arrived to first follow up highly motivated. Palpation presented with pain across the medial knee. MCL valgus stress test presented with diffuse pain along medial knee; consistent with signs and symptoms relative to a ligamentous strain. Pt able to tolerate LE strengthening exercises following objective testing without additional reports of pain. PT provided initial HEP and educated patient on proper technique. Pt will continue to benefit from skilled PT services to address deficits in strength, balance, and mobility in order to return to full function at home.    OBJECTIVE IMPAIRMENTS: decreased activity  tolerance, decreased endurance, difficulty walking, decreased strength, and pain.   ACTIVITY LIMITATIONS: bending, standing, squatting, and stairs  PARTICIPATION LIMITATIONS: shopping and yard work  PERSONAL FACTORS: Age, Fitness, Past/current experiences, and 3+ comorbidities: DDD, GERD,  HLD, OP are also affecting patient's functional outcome.   REHAB POTENTIAL: Good  CLINICAL DECISION MAKING: Evolving/moderate complexity  EVALUATION COMPLEXITY: Moderate   GOALS: Goals reviewed with patient? Yes  SHORT TERM GOALS: Target date: 03/26/2023  Pt will be independent with HEP to improve strength and decrease knee pain to improve pain-free function at home and work. Baseline: Deferred Goal status: INITIAL   LONG TERM GOALS: Target date: 04/09/2023  Pt will increase FOTO to at least 59 to demonstrate significant improvement in function at home and work related to knee pain  Baseline: Eval: 40% Goal status: INITIAL  2.  Pt will decrease worst knee pain by at least 3 points on the NPRS in order to demonstrate clinically significant reduction in back pain. Baseline: Eval: 9/10 Goal status: INITIAL  3.  Pt will increase strength of BLE hip abductors by at least 1/2 MMT grade in order to demonstrate improvement in strength and function  Baseline: Eval: 4-/5 Goal status: INITIAL   PLAN: PT FREQUENCY: 1-2x/week  PT DURATION: 8 weeks  PLANNED INTERVENTIONS: Therapeutic exercises, Therapeutic activity, Neuromuscular re-education, Balance training, Gait training, Patient/Family education, Self Care, Joint mobilization, Joint manipulation, Vestibular training, Canalith repositioning, Orthotic/Fit training, DME instructions, Dry Needling, Electrical stimulation, Spinal manipulation, Spinal mobilization, Cryotherapy, Moist heat, Taping, Traction, Ultrasound, Ionotophoresis 4mg /ml Dexamethasone, Manual therapy, and Re-evaluation.  PLAN FOR NEXT SESSION: PAM, Knee strengthening, review and  modify HEP  Pranish Akhavan, SPT Jason D Huprich PT, DPT, GCS

## 2023-02-26 ENCOUNTER — Ambulatory Visit: Payer: Medicare PPO

## 2023-02-26 DIAGNOSIS — M6281 Muscle weakness (generalized): Secondary | ICD-10-CM

## 2023-02-26 DIAGNOSIS — G8929 Other chronic pain: Secondary | ICD-10-CM | POA: Diagnosis not present

## 2023-02-26 DIAGNOSIS — M25562 Pain in left knee: Secondary | ICD-10-CM | POA: Diagnosis not present

## 2023-02-26 NOTE — Therapy (Cosign Needed)
OUTPATIENT PHYSICAL THERAPY THORACOLUMBAR TREATMENT   Patient Name: Angie Copeland MRN: [id number] DOB:05-Jul-1951, 72 y.o., female Today's Date: 02/27/2023  END OF SESSION:  PT End of Session - 02/26/23 0806     Visit Number 3    Number of Visits 9    Date for PT Re-Evaluation 04/09/23    PT Start Time 0805    PT Stop Time 0845    PT Time Calculation (min) 40 min    Activity Tolerance Patient tolerated treatment well    Behavior During Therapy [facility] for tasks assessed/performed            Past Medical History:  Diagnosis Date   Allergy    DDD (degenerative disc disease), lumbar    Esophageal mass 2015   Found incidently on CXR- Saw ENT and for visualization and nothing was there.    GERD (gastroesophageal reflux disease)    Hyperlipidemia    Osteoporosis    Past Surgical History:  Procedure Laterality Date   COLONOSCOPY WITH PROPOFOL N/A 02/06/2021   Procedure: COLONOSCOPY WITH PROPOFOL;  Surgeon: Pasty Spillers, MD;  Location: [facility] ENDOSCOPY;  Service: Endoscopy;  Laterality: N/A;   EYE SURGERY     cataract surgery    NOSE SURGERY     Cartilage removed from left side of nose   Patient Active Problem List   Diagnosis Date Noted   Acute pain of left knee 01/27/2023   Family history of colon cancer    Cecal polyp    Polyp of ascending colon    Vitamin D deficiency 12/19/2016   Advance directive discussed with patient 12/17/2016   Rosacea 10/15/2016   Allergic rhinitis 10/15/2016   GE reflux 10/15/2016   Hyperlipidemia, unspecified 10/15/2016   Osteoporosis 10/15/2016   Fever blister 10/15/2016   Anxiety 10/15/2016   DDD (degenerative disc disease), lumbar 01/20/2012    PCP: Dorcas Carrow, DO  REFERRING PROVIDER: Juanell Fairly, MD  REFERRING DIAG: Sprain of Medial Collateral Ligament of Left Knee F62.130Q; Chondromalacia Patellae, Left Knee M22.42  RATIONALE FOR EVALUATION AND TREATMENT: Rehabilitation  THERAPY DIAG: Chronic pain of left  knee  Muscle weakness (generalized)  ONSET DATE: 01/11/2023  FOLLOW-UP APPT SCHEDULED WITH REFERRING PROVIDER:  Did Not Address   SUBJECTIVE:                                                                                                                                                                                         SUBJECTIVE STATEMENT: "I've been having pain in my left knee following a bad step on the stairs. My doctor has mentioned that I have sprained the ligament in  my left knee."  PERTINENT HISTORY:  Patient reports walking down the steps 01/11/2023, "I stepped wrong and I had this shooting pain in my left leg. A history of lumbar DDD, low back pain, left hip pain, left knee pain and left foot pain. The pain can radiate from her back all the way down to her left foot. She reports associated numbness, tingling and weakness in the left lower extremity. She reports that walking on harder surfaces (I.e. the store, cement, etc) for prolonged time. She denies loss of bowel or bladder control. She denies recent injury and has never had back surgery.  She has tried Tylenol OTC with minimal relief of symptoms.   PAIN:    Pain Intensity: Present: 5/10, Best: 2/10, Worst: 9/10 Pain location: Left Anteromedial Pain  Pain Quality: aching  Radiating: Yes, radiates superior to the hip  Numbness/Tingling: Yes, bilateral feet   Focal Weakness: Yes Pt reports "knee feels like it will give out in the shower"  Aggravating factors: "Harder Floors", Prolonged Walking Relieving factors: Rest, Elevation, Massage 24-hour pain behavior: Increased pain with prolonged activities throughout the day.  History of prior back injury, pain, surgery, or therapy: Yes Dominant hand: right Imaging: No  Red flags: Negative for bowel/bladder changes, saddle paresthesia, personal history of cancer, h/o spinal tumors, h/o compression fx, h/o abdominal aneurysm, abdominal pain, chills/fever, night sweats, nausea,  vomiting, unrelenting pain,  PRECAUTIONS: None  WEIGHT BEARING RESTRICTIONS: No  FALLS: Has patient fallen in last 6 months? No  Living Environment Lives with: lives alone Lives in: House/apartment Stairs: Yes: Internal: 12 steps; on left going up Has following equipment at home: Dan Humphreys - 2 wheeled  Prior level of function: Independent  Occupational demands: Retired  Presenter, broadcasting: Social research officer, government, Walking with friend (30 min), Chair Yoga   Patient Goals: Patient would like to walk without pain and return to walking with my friend.    OBJECTIVE:  Patient Surveys  FOTO 40%; Predicted Value 59%  Cognition Patient is oriented to person, place, and time.  Recent memory is intact.  Remote memory is intact.  Attention span and concentration are intact.  Expressive speech is intact.  Patient's fund of knowledge is within normal limits for educational level.    Gross Musculoskeletal Assessment Tremor: None Tone: Normal Noted minor swelling and slightly darker skin tone in medial LLE.   GAIT: Distance walked: 10 Assistive device utilized: None Level of assistance: Complete Independence Comments: No gross abnormalities noted at the ankle and knee joint; decreased trunk rotation.   Posture: Lumbar lordosis: WNL Iliac crest height: Equal bilaterally Lumbar lateral shift: Negative  AROM AROM (Normal range in degrees) AROM   Lumbar   Flexion (65)   Extension (30)   Right lateral flexion (25)   Left lateral flexion (25)   Right rotation (30)   Left rotation (30)       Hip Right Left  Flexion (125) WNL WNL  Extension (15) WNL WNL  Abduction (40)    Adduction     Internal Rotation (45) WNL WNL  External Rotation (45) WNL WNL      Knee    Flexion (135) WNL WNL  Extension (0) WNL* WNL      Ankle    Dorsiflexion (20)    Plantarflexion (50)    Inversion (35)    Eversion (15)    (* = pain; Blank rows = not tested)  LE MMT: MMT (out of 5) Right  Left   Hip flexion 4-  4-  Hip extension    Hip abduction 4- 4-  Hip adduction 4- 4-  Hip internal rotation 4 4  Hip external rotation 4 4-*  Knee flexion 4 4  Knee extension 5 5  Ankle dorsiflexion 5 5  Ankle plantarflexion 5 5  Ankle inversion    Ankle eversion    (* = pain; Blank rows = not tested)  Sensation Grossly intact to light touch throughout bilateral LEs as determined by testing dermatomes L2-S2. Proprioception, stereognosis, and hot/cold testing deferred on this date.  Reflexes Deferred  Muscle Length Deferred   Palpation Location LEFT  RIGHT           Quadriceps    Medial Hamstrings    Lateral Hamstrings    Lateral Hamstring tendon    Medial Hamstring tendon    Quadriceps tendon    Patella    Patellar Tendon    Tibial Tuberosity    Medial joint line    Lateral joint line    MCL    LCL    Adductor Tubercle    Pes Anserine tendon    Infrapatellar fat pad    Fibular head    Popliteal fossa    (Blank rows = not tested) Graded on 0-4 scale (0 = no pain, 1 = pain, 2 = pain with wincing/grimacing/flinching, 3 = pain with withdrawal, 4 = unwilling to allow palpation), (Blank rows = not tested)  Passive Accessory Motion Deferred to next session  VASCULAR  SPECIAL TESTS  Ligamentous Stability   MCL: Valgus Stress (30 degrees flexion): R: Not done L: Not done  LCL: Varus Stress (30 degrees flexion): R: Not done L: Not done  Meniscus Tests McMurray's Test:  Medial Meniscus (Tibial ER): R: Not done L: Not done Lateral Meniscus (Tibial IR): R: Not done L: Not done  [facility]: R: Not done L: Not done Steinmann Sign I: R: Not done L: Not done Steinmann Sign II: R: Not done L: Not done Effusion: R: Not done L: Not done   Patellofemoral Pain Syndrome Patellar Tilt (Lateral): R: Not done L: Not done Squatting pain: R: Not done L: Not done Stair climbing pain: R: Not done L: Not done Kneeling pain: R: Not done L: Not done Resisted knee extension pain: R: Not done L: Not  done Compression: R: Not done L: Not done Clarke's sign: R: Not done L: Not done Lateral Pull: R: Not done L: Not done  Patellar Tendinopathy Inferior pole palpation with anterior tilt: R: Not done L: Not done   Beighton Scale  LEFT  RIGHT           1. Passive dorsiflexion and hyperextension of the fifth MCP joint beyond 90  0 0   2. Passive apposition of the thumb to the flexor aspect of the forearm  0  0   3. Passive hyperextension of the elbow beyond 10  0  0   4. Passive hyperextension of the knee beyond 10  0  0   5. Active forward flexion of the trunk with the knees fully extended so that the palms of the hands rest flat on the floor   0   TOTAL         0/ 9    [place] Knee Rules for Acute Knee injury  Yes/No         1. Aged 55 years or over Yes  2. Tenderness at the head of the fibula Not tested   3. Isolated tenderness of the  patella  No  4. Inability to flex knee to 90 degrees No  5. Inability to bear weight (defined as an inability to take 4 steps, ie two steps on each leg, regardless of limping) immediately and at presentation No   Ligamentous Stability   MCL: Valgus Stress (30 degrees flexion): R: Negative : Positive with grimace and recoil.   TODAY'S TREATMENT:  Subjective: Patient reported minor soreness in the knee following a weekend with increased activity in the yard. No further questions or concerns.    Pain: 3/10 in the left knee   There-ex: NuStep Level 1-3 x 6 min for warm up and interval history, PT adjusted resistance; Prone Manual Resisted Knee Flexion 1 x 10 (Right Leg Cramping);  Supine Hip Abduction Green & Blue TB 2 x 10;  Supine Bridge with Hip Abduction Blue TB 1 x 10;  Standing Knee Flexion 2 x 10 4# Ankle Weight;  LAQ 4# 2 x 10;  No updates to HEP;  Manual Therapy: Manual Hamstring Stretch Right 3 x 30s  Myofascial Release to the Right Hamstring 2 x 30s   Passive Accessory Motion Tibiofemoral: Anterior Glide: R: Negative L:  Negative Posterior Glide: R: Negative L: Negative  Patellofemoral: Superior Glide: R: Negative L: Negative Inferior Glide: R: Negative L: Negative Medial Glide: R: Not examined L: Negative Lateral Glide: R: Negative L: Negative  Medial Tilt: R: Negative L: Negative Lateral Tilt: R: Negative L: Negative   PATIENT EDUCATION:  Education details: Pt educated throughout session about proper posture and technique with exercises. Improved exercise technique, movement at target joints, use of target muscles after min to mod verbal, visual, tactile cues.  Person educated: Patient Education method: Explanation Education comprehension: verbalized understanding   HOME EXERCISE PROGRAM:  Access Code: [facility] URL: [email or web address] Date: 02/20/2023 Prepared by: Ria Comment  Exercises - Seated Hamstring Stretch  - 1 x daily - 7 x weekly - 3 sets - 30 hold - Supine Bridge  - 1 x daily - 7 x weekly - 2-3 sets - 10 reps - 3 hold - Hooklying Clamshell with Resistance  - 1 x daily - 7 x weekly - 2-3 sets - 10 reps - 3 hold - Supine Active Straight Leg Raise  - 1 x daily - 7 x weekly - 2-3 sets - 10 reps   ASSESSMENT:  CLINICAL IMPRESSION: Patient arrived to physical therapy highly motivated. Progressed strengthening exercises for the LE. Patient still presents with tightness and intermittent pain at the medial hamstring muscle belly/tendon. Pt encouraged to perform stretch program throughout the day in order to improve pain. Objective findings of PAM were non significant for concordant pain. Patient has endorsed that pain is improving and has become less diffuse across the medial knee. Pt encouraged to adhere to HEP in order to increase strength. No updates to HEP. Pt will continue to benefit from skilled PT services to address deficits in strength, balance, and mobility in order to return to full function at home.    OBJECTIVE IMPAIRMENTS: decreased activity tolerance,  decreased endurance, difficulty walking, decreased strength, and pain.   ACTIVITY LIMITATIONS: bending, standing, squatting, and stairs  PARTICIPATION LIMITATIONS: shopping and yard work  PERSONAL FACTORS: Age, Fitness, Past/current experiences, and 3+ comorbidities: DDD, GERD, HLD, OP are also affecting patient's functional outcome.   REHAB POTENTIAL: Good  CLINICAL DECISION MAKING: Evolving/moderate complexity  EVALUATION COMPLEXITY: Moderate   GOALS: Goals reviewed with patient? Yes  SHORT TERM GOALS: Target date: 03/26/2023  Pt will be  independent with HEP to improve strength and decrease knee pain to improve pain-free function at home and work. Baseline: Deferred Goal status: INITIAL   LONG TERM GOALS: Target date: 04/09/2023  Pt will increase FOTO to at least 59 to demonstrate significant improvement in function at home and work related to knee pain  Baseline: Eval: 40% Goal status: INITIAL  2.  Pt will decrease worst knee pain by at least 3 points on the NPRS in order to demonstrate clinically significant reduction in back pain. Baseline: Eval: 9/10 Goal status: INITIAL  3.  Pt will increase strength of BLE hip abductors by at least 1/2 MMT grade in order to demonstrate improvement in strength and function  Baseline: Eval: 4-/5 Goal status: INITIAL   PLAN: PT FREQUENCY: 1-2x/week  PT DURATION: 8 weeks  PLANNED INTERVENTIONS: Therapeutic exercises, Therapeutic activity, Neuromuscular re-education, Balance training, Gait training, Patient/Family education, Self Care, Joint mobilization, Joint manipulation, Vestibular training, Canalith repositioning, Orthotic/Fit training, DME instructions, Dry Needling, Electrical stimulation, Spinal manipulation, Spinal mobilization, Cryotherapy, Moist heat, Taping, Traction, Ultrasound, Ionotophoresis 4mg /ml Dexamethasone, Manual therapy, and Re-evaluation.  PLAN FOR NEXT SESSION:  Stairway navigation, Squatting, Continue Hip  and Knee strengthening, review and modify HEP  Devory Mckinzie, SPT Jason D Huprich PT, DPT, GCS

## 2023-03-05 ENCOUNTER — Ambulatory Visit: Payer: Medicare PPO

## 2023-03-05 DIAGNOSIS — G8929 Other chronic pain: Secondary | ICD-10-CM | POA: Diagnosis not present

## 2023-03-05 DIAGNOSIS — M6281 Muscle weakness (generalized): Secondary | ICD-10-CM

## 2023-03-05 DIAGNOSIS — M25562 Pain in left knee: Secondary | ICD-10-CM | POA: Diagnosis not present

## 2023-03-05 NOTE — Therapy (Cosign Needed Addendum)
OUTPATIENT PHYSICAL THERAPY THORACOLUMBAR TREATMENT   Patient Name: Angie Copeland MRN: [id number] DOB:1950-10-03, 72 y.o., female Today's Date: 03/06/2023  END OF SESSION:  PT End of Session - 03/05/23 0813     Visit Number 4    Number of Visits 9    Date for PT Re-Evaluation 04/09/23    PT Start Time 0808    PT Stop Time 0846    PT Time Calculation (min) 38 min    Activity Tolerance Patient tolerated treatment well    Behavior During Therapy [facility] for tasks assessed/performed            Past Medical History:  Diagnosis Date   Allergy    DDD (degenerative disc disease), lumbar    Esophageal mass 2015   Found incidently on CXR- Saw ENT and for visualization and nothing was there.    GERD (gastroesophageal reflux disease)    Hyperlipidemia    Osteoporosis    Past Surgical History:  Procedure Laterality Date   COLONOSCOPY WITH PROPOFOL N/A 02/06/2021   Procedure: COLONOSCOPY WITH PROPOFOL;  Surgeon: Pasty Spillers, MD;  Location: [facility];  Service: Endoscopy;  Laterality: N/A;   EYE SURGERY     cataract surgery    NOSE SURGERY     Cartilage removed from left side of nose   Patient Active Problem List   Diagnosis Date Noted   Acute pain of left knee 01/27/2023   Family history of colon cancer    Cecal polyp    Polyp of ascending colon    Vitamin D deficiency 12/19/2016   Advance directive discussed with patient 12/17/2016   Rosacea 10/15/2016   Allergic rhinitis 10/15/2016   GE reflux 10/15/2016   Hyperlipidemia, unspecified 10/15/2016   Osteoporosis 10/15/2016   Fever blister 10/15/2016   Anxiety 10/15/2016   DDD (degenerative disc disease), lumbar 01/20/2012    PCP: Dorcas Carrow, DO  REFERRING PROVIDER: Juanell Fairly, MD  REFERRING DIAG: Sprain of Medial Collateral Ligament of Left Knee N56.213Y; Chondromalacia Patellae, Left Knee M22.42  RATIONALE FOR EVALUATION AND TREATMENT: Rehabilitation  THERAPY DIAG: Chronic pain of left  knee  Muscle weakness (generalized)  ONSET DATE: 01/11/2023  FOLLOW-UP APPT SCHEDULED WITH REFERRING PROVIDER:  Did Not Address   SUBJECTIVE:                                                                                                                                                                                         SUBJECTIVE STATEMENT: "I've been having pain in my left knee following a bad step on the stairs. My doctor has mentioned that I have sprained the ligament in  my left knee."  PERTINENT HISTORY:  Patient reports walking down the steps 01/11/2023, "I stepped wrong and I had this shooting pain in my left leg. A history of lumbar DDD, low back pain, left hip pain, left knee pain and left foot pain. The pain can radiate from her back all the way down to her left foot. She reports associated numbness, tingling and weakness in the left lower extremity. She reports that walking on harder surfaces (I.e. the store, cement, etc) for prolonged time. She denies loss of bowel or bladder control. She denies recent injury and has never had back surgery.  She has tried Tylenol OTC with minimal relief of symptoms.   PAIN:    Pain Intensity: Present: 5/10, Best: 2/10, Worst: 9/10 Pain location: Left Anteromedial Pain  Pain Quality: aching  Radiating: Yes, radiates superior to the hip  Numbness/Tingling: Yes, bilateral feet   Focal Weakness: Yes Pt reports "knee feels like it will give out in the shower"  Aggravating factors: "Harder Floors", Prolonged Walking Relieving factors: Rest, Elevation, Massage 24-hour pain behavior: Increased pain with prolonged activities throughout the day.  History of prior back injury, pain, surgery, or therapy: Yes Dominant hand: right Imaging: No  Red flags: Negative for bowel/bladder changes, saddle paresthesia, personal history of cancer, h/o spinal tumors, h/o compression fx, h/o abdominal aneurysm, abdominal pain, chills/fever, night sweats, nausea,  vomiting, unrelenting pain,  PRECAUTIONS: None  WEIGHT BEARING RESTRICTIONS: No  FALLS: Has patient fallen in last 6 months? No  Living Environment Lives with: lives alone Lives in: House/apartment Stairs: Yes: Internal: 12 steps; on left going up Has following equipment at home: Dan Humphreys - 2 wheeled  Prior level of function: Independent  Occupational demands: Retired  Presenter, broadcasting: Social research officer, government, Walking with friend (30 min), Chair Yoga   Patient Goals: Patient would like to walk without pain and return to walking with my friend.    OBJECTIVE:  Patient Surveys  FOTO 40%; Predicted Value 59%  Cognition Patient is oriented to person, place, and time.  Recent memory is intact.  Remote memory is intact.  Attention span and concentration are intact.  Expressive speech is intact.  Patient's fund of knowledge is within normal limits for educational level.    Gross Musculoskeletal Assessment Tremor: None Tone: Normal Noted minor swelling and slightly darker skin tone in medial LLE.   GAIT: Distance walked: 10 Assistive device utilized: None Level of assistance: Complete Independence Comments: No gross abnormalities noted at the ankle and knee joint; decreased trunk rotation.   Posture: Lumbar lordosis: WNL Iliac crest height: Equal bilaterally Lumbar lateral shift: Negative  AROM AROM (Normal range in degrees) AROM   Lumbar   Flexion (65)   Extension (30)   Right lateral flexion (25)   Left lateral flexion (25)   Right rotation (30)   Left rotation (30)       Hip Right Left  Flexion (125) WNL WNL  Extension (15) WNL WNL  Abduction (40)    Adduction     Internal Rotation (45) WNL WNL  External Rotation (45) WNL WNL      Knee    Flexion (135) WNL WNL  Extension (0) WNL* WNL      Ankle    Dorsiflexion (20)    Plantarflexion (50)    Inversion (35)    Eversion (15)    (* = pain; Blank rows = not tested)  LE MMT: MMT (out of 5) Right  Left   Hip flexion 4-  4-  Hip extension    Hip abduction 4- 4-  Hip adduction 4- 4-  Hip internal rotation 4 4  Hip external rotation 4 4-*  Knee flexion 4 4  Knee extension 5 5  Ankle dorsiflexion 5 5  Ankle plantarflexion 5 5  Ankle inversion    Ankle eversion    (* = pain; Blank rows = not tested)  Sensation Grossly intact to light touch throughout bilateral LEs as determined by testing dermatomes L2-S2. Proprioception, stereognosis, and hot/cold testing deferred on this date.  Reflexes Deferred  Muscle Length Deferred   Palpation Location LEFT  RIGHT           Quadriceps    Medial Hamstrings    Lateral Hamstrings    Lateral Hamstring tendon    Medial Hamstring tendon    Quadriceps tendon    Patella    Patellar Tendon    Tibial Tuberosity    Medial joint line    Lateral joint line    MCL    LCL    Adductor Tubercle    Pes Anserine tendon    Infrapatellar fat pad    Fibular head    Popliteal fossa    (Blank rows = not tested) Graded on 0-4 scale (0 = no pain, 1 = pain, 2 = pain with wincing/grimacing/flinching, 3 = pain with withdrawal, 4 = unwilling to allow palpation), (Blank rows = not tested)  Passive Accessory Motion Deferred to next session  VASCULAR  SPECIAL TESTS  Ligamentous Stability   MCL: Valgus Stress (30 degrees flexion): R: Not done L: Not done  LCL: Varus Stress (30 degrees flexion): R: Not done L: Not done  Meniscus Tests McMurray's Test:  Medial Meniscus (Tibial ER): R: Not done L: Not done Lateral Meniscus (Tibial IR): R: Not done L: Not done  [facility]: R: Not done L: Not done Steinmann Sign I: R: Not done L: Not done Steinmann Sign II: R: Not done L: Not done Effusion: R: Not done L: Not done   Patellofemoral Pain Syndrome Patellar Tilt (Lateral): R: Not done L: Not done Squatting pain: R: Not done L: Not done Stair climbing pain: R: Not done L: Not done Kneeling pain: R: Not done L: Not done Resisted knee extension pain: R: Not done L: Not  done Compression: R: Not done L: Not done Clarke's sign: R: Not done L: Not done Lateral Pull: R: Not done L: Not done  Patellar Tendinopathy Inferior pole palpation with anterior tilt: R: Not done L: Not done   Beighton Scale  LEFT  RIGHT           1. Passive dorsiflexion and hyperextension of the fifth MCP joint beyond 90  0 0   2. Passive apposition of the thumb to the flexor aspect of the forearm  0  0   3. Passive hyperextension of the elbow beyond 10  0  0   4. Passive hyperextension of the knee beyond 10  0  0   5. Active forward flexion of the trunk with the knees fully extended so that the palms of the hands rest flat on the floor   0   TOTAL         0/ 9    [place] Knee Rules for Acute Knee injury  Yes/No         1. Aged 55 years or over Yes  2. Tenderness at the head of the fibula Not tested   3. Isolated tenderness of the  patella  No  4. Inability to flex knee to 90 degrees No  5. Inability to bear weight (defined as an inability to take 4 steps, ie two steps on each leg, regardless of limping) immediately and at presentation No   Ligamentous Stability   MCL: Valgus Stress (30 degrees flexion): R: Negative : Positive with grimace and recoil.    TODAY'S TREATMENT:  Subjective: Patient arrived at physical therapy with no new reports. Patient stated her left hip has been hurting.   No further questions or concerns.     Pain: 4/10 in the left knee    Ther-ex: NuStep Level 1-4 x 6 min for warm up and interval history, PT adjusted resistance; SAQ with Ball Squeeze and 4# Ankle Weight 2 x 10 BLE;  Hooklying Hip Abduction Isometric Hold with Green Theraband 1 x 10 x 10s;  Supine Bridge with Hip Abduction 2 x 10;  Step Ups on 6" 2 x 10 BLE;  Manual Therapy:  Educated and Performed Left LE Stretches: Manual Crossover Left LE 2 x 30s hold  Manual Figure 4 Stretch 2 x 30s hold  Manual Hamstring Stretch 2 x 30s hold    PATIENT EDUCATION:  Education details: Pt  educated throughout session about proper posture and technique with exercises. Improved exercise technique, movement at target joints, use of target muscles after min to mod verbal, visual, tactile cues.  Person educated: Patient Education method: Explanation Education comprehension: verbalized understanding   HOME EXERCISE PROGRAM:  Access Code: [facility] URL: [email or web address] Date: 03/06/2023 Prepared by: Ria Comment  Exercises - Seated Hamstring Stretch  - 1 x daily - 7 x weekly - 3 sets - 30 hold - Supine Bridge  - 1 x daily - 7 x weekly - 2-3 sets - 10 reps - 3 hold - Hooklying Clamshell with Resistance  - 1 x daily - 7 x weekly - 2-3 sets - 10 reps - 3 hold - Supine Active Straight Leg Raise  - 1 x daily - 7 x weekly - 2-3 sets - 10 reps - Supine Figure 4 Piriformis Stretch  - 1 x daily - 7 x weekly - 3 sets - 10 reps - Supine Gluteus Stretch  - 1 x daily - 7 x weekly - 3 sets - 10 reps - Supine Hamstring Stretch  - 1 x daily - 7 x weekly - 3 sets - 10 reps   ASSESSMENT:  CLINICAL IMPRESSION: Patient arrived to physical therapy highly motivated. Progressed strengthening exercises for the LE. Pt demonstrated supine bridges with hip abduction without additional report of pain. Performed step ups at stairs with good ability to maintain proper knee alignment with ascending. Pt endorsed that descending staircase has been easier and she has more aware of decreasing knee valgus. PT provided handout and education on proper hip stretches in order to reduce pain and tightness in the AM. She will begin incorporating additional exercises at local gym in order to increase muscular endurance and strength. Updated HEP provided to patient to include stretches. Pt will continue to benefit from skilled PT services to address deficits in strength, balance, and mobility in order to return to full function at home.  OBJECTIVE IMPAIRMENTS: decreased activity tolerance, decreased  endurance, difficulty walking, decreased strength, and pain.   ACTIVITY LIMITATIONS: bending, standing, squatting, and stairs  PARTICIPATION LIMITATIONS: shopping and yard work  PERSONAL FACTORS: Age, Fitness, Past/current experiences, and 3+ comorbidities: DDD, GERD, HLD, OP are also affecting patient's functional outcome.   REHAB POTENTIAL:  Good  CLINICAL DECISION MAKING: Evolving/moderate complexity  EVALUATION COMPLEXITY: Moderate   GOALS: Goals reviewed with patient? Yes  SHORT TERM GOALS: Target date: 03/26/2023  Pt will be independent with HEP to improve strength and decrease knee pain to improve pain-free function at home and work. Baseline: Deferred Goal status: INITIAL   LONG TERM GOALS: Target date: 04/09/2023  Pt will increase FOTO to at least 59 to demonstrate significant improvement in function at home and work related to knee pain  Baseline: Eval: 40% Goal status: INITIAL  2.  Pt will decrease worst knee pain by at least 3 points on the NPRS in order to demonstrate clinically significant reduction in back pain. Baseline: Eval: 9/10 Goal status: INITIAL  3.  Pt will increase strength of BLE hip abductors by at least 1/2 MMT grade in order to demonstrate improvement in strength and function  Baseline: Eval: 4-/5 Goal status: INITIAL   PLAN: PT FREQUENCY: 1-2x/week  PT DURATION: 8 weeks  PLANNED INTERVENTIONS: Therapeutic exercises, Therapeutic activity, Neuromuscular re-education, Balance training, Gait training, Patient/Family education, Self Care, Joint mobilization, Joint manipulation, Vestibular training, Canalith repositioning, Orthotic/Fit training, DME instructions, Dry Needling, Electrical stimulation, Spinal manipulation, Spinal mobilization, Cryotherapy, Moist heat, Taping, Traction, Ultrasound, Ionotophoresis 4mg /ml Dexamethasone, Manual therapy, and Re-evaluation.  PLAN FOR NEXT SESSION:  Stairway navigation, Squatting, Continue Hip and Knee  strengthening, review and modify HEP  Madyn Ivins, SPT Jason D Huprich PT, DPT, GCS

## 2023-03-12 ENCOUNTER — Ambulatory Visit: Payer: Medicare PPO | Attending: Orthopedic Surgery

## 2023-03-12 DIAGNOSIS — M25562 Pain in left knee: Secondary | ICD-10-CM | POA: Insufficient documentation

## 2023-03-12 DIAGNOSIS — M6281 Muscle weakness (generalized): Secondary | ICD-10-CM | POA: Diagnosis not present

## 2023-03-12 DIAGNOSIS — G8929 Other chronic pain: Secondary | ICD-10-CM | POA: Insufficient documentation

## 2023-03-12 NOTE — Therapy (Addendum)
OUTPATIENT PHYSICAL THERAPY THORACOLUMBAR TREATMENT   Patient Name: Angie Copeland MRN: [id number] DOB:Dec 04, 1950, 72 y.o., female Today's Date: 03/12/2023  END OF SESSION:  PT End of Session - 03/12/23 0804     Visit Number 5    Number of Visits 9    Date for PT Re-Evaluation 04/09/23    PT Start Time 0801    PT Stop Time 0845    PT Time Calculation (min) 44 min    Activity Tolerance Patient tolerated treatment well    Behavior During Therapy [facility] for tasks assessed/performed            Past Medical History:  Diagnosis Date   Allergy    DDD (degenerative disc disease), lumbar    Esophageal mass 2015   Found incidently on CXR- Saw ENT and for visualization and nothing was there.    GERD (gastroesophageal reflux disease)    Hyperlipidemia    Osteoporosis    Past Surgical History:  Procedure Laterality Date   COLONOSCOPY WITH PROPOFOL N/A 02/06/2021   Procedure: COLONOSCOPY WITH PROPOFOL;  Surgeon: Pasty Spillers, MD;  Location: [facility] ENDOSCOPY;  Service: Endoscopy;  Laterality: N/A;   EYE SURGERY     cataract surgery    NOSE SURGERY     Cartilage removed from left side of nose   Patient Active Problem List   Diagnosis Date Noted   Acute pain of left knee 01/27/2023   Family history of colon cancer    Cecal polyp    Polyp of ascending colon    Vitamin D deficiency 12/19/2016   Advance directive discussed with patient 12/17/2016   Rosacea 10/15/2016   Allergic rhinitis 10/15/2016   GE reflux 10/15/2016   Hyperlipidemia, unspecified 10/15/2016   Osteoporosis 10/15/2016   Fever blister 10/15/2016   Anxiety 10/15/2016   DDD (degenerative disc disease), lumbar 01/20/2012    PCP: Dorcas Carrow, DO  REFERRING PROVIDER: Juanell Fairly, MD  REFERRING DIAG: Sprain of Medial Collateral Ligament of Left Knee G38.756E; Chondromalacia Patellae, Left Knee M22.42  RATIONALE FOR EVALUATION AND TREATMENT: Rehabilitation  THERAPY DIAG: Chronic pain of left  knee  Muscle weakness (generalized)  ONSET DATE: 01/11/2023  FOLLOW-UP APPT SCHEDULED WITH REFERRING PROVIDER:  Did Not Address   SUBJECTIVE:                                                                                                                                                                                         SUBJECTIVE STATEMENT: "I've been having pain in my left knee following a bad step on the stairs. My doctor has mentioned that I have sprained the ligament in  my left knee."  PERTINENT HISTORY:  Patient reports walking down the steps 01/11/2023, "I stepped wrong and I had this shooting pain in my left leg. A history of lumbar DDD, low back pain, left hip pain, left knee pain and left foot pain. The pain can radiate from her back all the way down to her left foot. She reports associated numbness, tingling and weakness in the left lower extremity. She reports that walking on harder surfaces (I.e. the store, cement, etc) for prolonged time. She denies loss of bowel or bladder control. She denies recent injury and has never had back surgery.  She has tried Tylenol OTC with minimal relief of symptoms.   PAIN:    Pain Intensity: Present: 5/10, Best: 2/10, Worst: 9/10 Pain location: Left Anteromedial Pain  Pain Quality: aching  Radiating: Yes, radiates superior to the hip  Numbness/Tingling: Yes, bilateral feet   Focal Weakness: Yes Pt reports "knee feels like it will give out in the shower"  Aggravating factors: "Harder Floors", Prolonged Walking Relieving factors: Rest, Elevation, Massage 24-hour pain behavior: Increased pain with prolonged activities throughout the day.  History of prior back injury, pain, surgery, or therapy: Yes Dominant hand: right Imaging: No  Red flags: Negative for bowel/bladder changes, saddle paresthesia, personal history of cancer, h/o spinal tumors, h/o compression fx, h/o abdominal aneurysm, abdominal pain, chills/fever, night sweats, nausea,  vomiting, unrelenting pain,  PRECAUTIONS: None  WEIGHT BEARING RESTRICTIONS: No  FALLS: Has patient fallen in last 6 months? No  Living Environment Lives with: lives alone Lives in: House/apartment Stairs: Yes: Internal: 12 steps; on left going up Has following equipment at home: Dan Humphreys - 2 wheeled  Prior level of function: Independent  Occupational demands: Retired  Presenter, broadcasting: Social research officer, government, Walking with friend (30 min), Chair Yoga   Patient Goals: Patient would like to walk without pain and return to walking with my friend.    OBJECTIVE:  Patient Surveys  FOTO 40%; Predicted Value 59%  Cognition Patient is oriented to person, place, and time.  Recent memory is intact.  Remote memory is intact.  Attention span and concentration are intact.  Expressive speech is intact.  Patient's fund of knowledge is within normal limits for educational level.    Gross Musculoskeletal Assessment Tremor: None Tone: Normal Noted minor swelling and slightly darker skin tone in medial LLE.   GAIT: Distance walked: 10 Assistive device utilized: None Level of assistance: Complete Independence Comments: No gross abnormalities noted at the ankle and knee joint; decreased trunk rotation.   Posture: Lumbar lordosis: WNL Iliac crest height: Equal bilaterally Lumbar lateral shift: Negative  AROM AROM (Normal range in degrees) AROM   Lumbar   Flexion (65)   Extension (30)   Right lateral flexion (25)   Left lateral flexion (25)   Right rotation (30)   Left rotation (30)       Hip Right Left  Flexion (125) WNL WNL  Extension (15) WNL WNL  Abduction (40)    Adduction     Internal Rotation (45) WNL WNL  External Rotation (45) WNL WNL      Knee    Flexion (135) WNL WNL  Extension (0) WNL* WNL      Ankle    Dorsiflexion (20)    Plantarflexion (50)    Inversion (35)    Eversion (15)    (* = pain; Blank rows = not tested)  LE MMT: MMT (out of 5) Right  Left   Hip flexion 4-  4-  Hip extension    Hip abduction 4- 4-  Hip adduction 4- 4-  Hip internal rotation 4 4  Hip external rotation 4 4-*  Knee flexion 4 4  Knee extension 5 5  Ankle dorsiflexion 5 5  Ankle plantarflexion 5 5  Ankle inversion    Ankle eversion    (* = pain; Blank rows = not tested)  Sensation Grossly intact to light touch throughout bilateral LEs as determined by testing dermatomes L2-S2. Proprioception, stereognosis, and hot/cold testing deferred on this date.  Reflexes Deferred  Muscle Length Deferred   Palpation Location LEFT  RIGHT           Quadriceps    Medial Hamstrings    Lateral Hamstrings    Lateral Hamstring tendon    Medial Hamstring tendon    Quadriceps tendon    Patella    Patellar Tendon    Tibial Tuberosity    Medial joint line    Lateral joint line    MCL    LCL    Adductor Tubercle    Pes Anserine tendon    Infrapatellar fat pad    Fibular head    Popliteal fossa    (Blank rows = not tested) Graded on 0-4 scale (0 = no pain, 1 = pain, 2 = pain with wincing/grimacing/flinching, 3 = pain with withdrawal, 4 = unwilling to allow palpation), (Blank rows = not tested)  Passive Accessory Motion Deferred to next session  VASCULAR  SPECIAL TESTS  Ligamentous Stability   MCL: Valgus Stress (30 degrees flexion): R: Not done L: Not done  LCL: Varus Stress (30 degrees flexion): R: Not done L: Not done  Meniscus Tests McMurray's Test:  Medial Meniscus (Tibial ER): R: Not done L: Not done Lateral Meniscus (Tibial IR): R: Not done L: Not done  [facility]: R: Not done L: Not done Steinmann Sign I: R: Not done L: Not done Steinmann Sign II: R: Not done L: Not done Effusion: R: Not done L: Not done   Patellofemoral Pain Syndrome Patellar Tilt (Lateral): R: Not done L: Not done Squatting pain: R: Not done L: Not done Stair climbing pain: R: Not done L: Not done Kneeling pain: R: Not done L: Not done Resisted knee extension pain: R: Not done L: Not  done Compression: R: Not done L: Not done Clarke's sign: R: Not done L: Not done Lateral Pull: R: Not done L: Not done  Patellar Tendinopathy Inferior pole palpation with anterior tilt: R: Not done L: Not done   Beighton Scale  LEFT  RIGHT           1. Passive dorsiflexion and hyperextension of the fifth MCP joint beyond 90  0 0   2. Passive apposition of the thumb to the flexor aspect of the forearm  0  0   3. Passive hyperextension of the elbow beyond 10  0  0   4. Passive hyperextension of the knee beyond 10  0  0   5. Active forward flexion of the trunk with the knees fully extended so that the palms of the hands rest flat on the floor   0   TOTAL         0/ 9    [place] Knee Rules for Acute Knee injury  Yes/No         1. Aged 55 years or over Yes  2. Tenderness at the head of the fibula Not tested   3. Isolated tenderness of the  patella  No  4. Inability to flex knee to 90 degrees No  5. Inability to bear weight (defined as an inability to take 4 steps, ie two steps on each leg, regardless of limping) immediately and at presentation No   Ligamentous Stability   MCL: Valgus Stress (30 degrees flexion): R: Negative : Positive with grimace and recoil.    TODAY'S TREATMENT:  Subjective: Patient arrived at physical therapy with no new reports. Patient reported that pain is improving and only occurs at night. No further questions or concerns.     Pain: 1/10 in the left knee  Ther-ex: NuStep Level 1-4 x 8 min for warm up and interval history, PT adjusted resistance; Supine Bridge with Ball Squeeze 1 x 10;  Supine Bridge with Hip Abduction, Blue Theraband 1 x 10;  Supine Bridge with Marches, alternating LE 2 x 10 steps; Sit to stand with 6# medicine ball 2 x 10;   TotalGym, Level 22 BLE Squat 2 x 10;  Forward Lunge in // bars x multiple lengths (verbal cues for step pattern);  Seated Calf Raises with 7# Dumbbell on BLE 2 x 10;  Heel Drop and Raise on Stairstep with BUE  support 1 x 10; Forward Lunge at Steps for Calves Stretch 2 x 5 BLE;   Educated pt on long sitting and seated Hamstring stretch   PATIENT EDUCATION:  Education details: Pt educated throughout session about proper posture and technique with exercises. Improved exercise technique, movement at target joints, use of target muscles after min to mod verbal, visual, tactile cues.  Person educated: Patient Education method: Explanation Education comprehension: verbalized understanding   HOME EXERCISE PROGRAM:  Access Code: [facility] URL: [email or web address] Date: 03/06/2023 Prepared by: Ria Comment  Exercises - Seated Hamstring Stretch  - 1 x daily - 7 x weekly - 3 sets - 30 hold - Supine Bridge  - 1 x daily - 7 x weekly - 2-3 sets - 10 reps - 3 hold - Hooklying Clamshell with Resistance  - 1 x daily - 7 x weekly - 2-3 sets - 10 reps - 3 hold - Supine Active Straight Leg Raise  - 1 x daily - 7 x weekly - 2-3 sets - 10 reps - Supine Figure 4 Piriformis Stretch  - 1 x daily - 7 x weekly - 3 sets - 10 reps - Supine Gluteus Stretch  - 1 x daily - 7 x weekly - 3 sets - 10 reps - Supine Hamstring Stretch  - 1 x daily - 7 x weekly - 3 sets - 10 reps   ASSESSMENT:  CLINICAL IMPRESSION: Patient arrived to physical therapy highly motivated. Progressed LE strengthening exercises in today's session. Pt has endorsed that daily stretches on HEP has improved pain and reduced tightness in hips and knee. Pt tolerated increase in resistance and weight bearing exercises; able to demonstrate squat on Total gym and forward lunge without additional report of pain in the knee. Pt encouraged to continue HEP. No updates to HEP today.  Pt will continue to benefit from skilled PT services to address deficits in strength, balance, and mobility in order to return to full function at home.  OBJECTIVE IMPAIRMENTS: decreased activity tolerance, decreased endurance, difficulty walking, decreased strength,  and pain.   ACTIVITY LIMITATIONS: bending, standing, squatting, and stairs  PARTICIPATION LIMITATIONS: shopping and yard work  PERSONAL FACTORS: Age, Fitness, Past/current experiences, and 3+ comorbidities: DDD, GERD, HLD, OP are also affecting patient's functional outcome.   REHAB POTENTIAL: Good  CLINICAL DECISION MAKING: Evolving/moderate complexity  EVALUATION COMPLEXITY: Moderate   GOALS: Goals reviewed with patient? Yes  SHORT TERM GOALS: Target date: 03/26/2023  Pt will be independent with HEP to improve strength and decrease knee pain to improve pain-free function at home and work. Baseline: Verbally reiterated HEP without handout Goal status: Achieved    LONG TERM GOALS: Target date: 04/09/2023  Pt will increase FOTO to at least 59 to demonstrate significant improvement in function at home and work related to knee pain  Baseline: Eval: 40% Goal status: INITIAL  2.  Pt will decrease worst knee pain by at least 3 points on the NPRS in order to demonstrate clinically significant reduction in back pain. Baseline: Eval: 9/10 Goal status: INITIAL  3.  Pt will increase strength of BLE hip abductors by at least 1/2 MMT grade in order to demonstrate improvement in strength and function  Baseline: Eval: 4-/5 Goal status: INITIAL  4.  Pt will demonstrate stairway navigation without knee valgus and report of pain in order to demonstrate improvement in functional mobility and ability to navigate to the second floor of her house. Baseline: n/a Goal status: INITIAL    PLAN: PT FREQUENCY: 1-2x/week  PT DURATION: 8 weeks  PLANNED INTERVENTIONS: Therapeutic exercises, Therapeutic activity, Neuromuscular re-education, Balance training, Gait training, Patient/Family education, Self Care, Joint mobilization, Joint manipulation, Vestibular training, Canalith repositioning, Orthotic/Fit training, DME instructions, Dry Needling, Electrical stimulation, Spinal manipulation, Spinal  mobilization, Cryotherapy, Moist heat, Taping, Traction, Ultrasound, Ionotophoresis 4mg /ml Dexamethasone, Manual therapy, and Re-evaluation.  PLAN FOR NEXT SESSION:  Stairway navigation, Squatting, Continue Hip and Knee strengthening, review and modify HEP  Ruhi Kopke, SPT  Maylon Peppers, PT, DPT Physical Therapist - [place]  [facility]

## 2023-03-19 ENCOUNTER — Ambulatory Visit: Payer: Medicare PPO

## 2023-03-19 DIAGNOSIS — G8929 Other chronic pain: Secondary | ICD-10-CM

## 2023-03-19 DIAGNOSIS — M6281 Muscle weakness (generalized): Secondary | ICD-10-CM | POA: Diagnosis not present

## 2023-03-19 DIAGNOSIS — M25562 Pain in left knee: Secondary | ICD-10-CM | POA: Diagnosis not present

## 2023-03-19 NOTE — Therapy (Addendum)
OUTPATIENT PHYSICAL THERAPY THORACOLUMBAR TREATMENT   Patient Name: Angie Copeland MRN: [id number] DOB:January 07, 1951, 72 y.o., female Today's Date: 03/19/2023  END OF SESSION:  PT End of Session - 03/19/23 0809     Visit Number 6    Number of Visits 9    Date for PT Re-Evaluation 04/09/23    PT Start Time 0809    PT Stop Time 0845    PT Time Calculation (min) 36 min    Activity Tolerance Patient tolerated treatment well    Behavior During Therapy [facility] for tasks assessed/performed             Past Medical History:  Diagnosis Date   Allergy    DDD (degenerative disc disease), lumbar    Esophageal mass 2015   Found incidently on CXR- Saw ENT and for visualization and nothing was there.    GERD (gastroesophageal reflux disease)    Hyperlipidemia    Osteoporosis    Past Surgical History:  Procedure Laterality Date   COLONOSCOPY WITH PROPOFOL N/A 02/06/2021   Procedure: COLONOSCOPY WITH PROPOFOL;  Surgeon: Pasty Spillers, MD;  Location: [facility] ENDOSCOPY;  Service: Endoscopy;  Laterality: N/A;   EYE SURGERY     cataract surgery    NOSE SURGERY     Cartilage removed from left side of nose   Patient Active Problem List   Diagnosis Date Noted   Acute pain of left knee 01/27/2023   Family history of colon cancer    Cecal polyp    Polyp of ascending colon    Vitamin D deficiency 12/19/2016   Advance directive discussed with patient 12/17/2016   Rosacea 10/15/2016   Allergic rhinitis 10/15/2016   GE reflux 10/15/2016   Hyperlipidemia, unspecified 10/15/2016   Osteoporosis 10/15/2016   Fever blister 10/15/2016   Anxiety 10/15/2016   DDD (degenerative disc disease), lumbar 01/20/2012    PCP: Dorcas Carrow, DO  REFERRING PROVIDER: Juanell Fairly, MD  REFERRING DIAG: Sprain of Medial Collateral Ligament of Left Knee Z66.063K; Chondromalacia Patellae, Left Knee M22.42  RATIONALE FOR EVALUATION AND TREATMENT: Rehabilitation  THERAPY DIAG: Chronic pain of left  knee  Muscle weakness (generalized)  ONSET DATE: 01/11/2023  FOLLOW-UP APPT SCHEDULED WITH REFERRING PROVIDER:  Did Not Address   SUBJECTIVE:                                                                                                                                                                                         SUBJECTIVE STATEMENT: "I've been having pain in my left knee following a bad step on the stairs. My doctor has mentioned that I have sprained the ligament  in my left knee."  PERTINENT HISTORY:  Patient reports walking down the steps 01/11/2023, "I stepped wrong and I had this shooting pain in my left leg. A history of lumbar DDD, low back pain, left hip pain, left knee pain and left foot pain. The pain can radiate from her back all the way down to her left foot. She reports associated numbness, tingling and weakness in the left lower extremity. She reports that walking on harder surfaces (I.e. the store, cement, etc) for prolonged time. She denies loss of bowel or bladder control. She denies recent injury and has never had back surgery.  She has tried Tylenol OTC with minimal relief of symptoms.   PAIN:    Pain Intensity: Present: 5/10, Best: 2/10, Worst: 9/10 Pain location: Left Anteromedial Pain  Pain Quality: aching  Radiating: Yes, radiates superior to the hip  Numbness/Tingling: Yes, bilateral feet   Focal Weakness: Yes Pt reports "knee feels like it will give out in the shower"  Aggravating factors: "Harder Floors", Prolonged Walking Relieving factors: Rest, Elevation, Massage 24-hour pain behavior: Increased pain with prolonged activities throughout the day.  History of prior back injury, pain, surgery, or therapy: Yes Dominant hand: right Imaging: No  Red flags: Negative for bowel/bladder changes, saddle paresthesia, personal history of cancer, h/o spinal tumors, h/o compression fx, h/o abdominal aneurysm, abdominal pain, chills/fever, night sweats, nausea,  vomiting, unrelenting pain,  PRECAUTIONS: None  WEIGHT BEARING RESTRICTIONS: No  FALLS: Has patient fallen in last 6 months? No  Living Environment Lives with: lives alone Lives in: House/apartment Stairs: Yes: Internal: 12 steps; on left going up Has following equipment at home: Dan Humphreys - 2 wheeled  Prior level of function: Independent  Occupational demands: Retired  Presenter, broadcasting: Social research officer, government, Walking with friend (30 min), Chair Yoga   Patient Goals: Patient would like to walk without pain and return to walking with my friend.    OBJECTIVE:  Patient Surveys  FOTO 40%; Predicted Value 59%  Cognition Patient is oriented to person, place, and time.  Recent memory is intact.  Remote memory is intact.  Attention span and concentration are intact.  Expressive speech is intact.  Patient's fund of knowledge is within normal limits for educational level.    Gross Musculoskeletal Assessment Tremor: None Tone: Normal Noted minor swelling and slightly darker skin tone in medial LLE.   GAIT: Distance walked: 10 Assistive device utilized: None Level of assistance: Complete Independence Comments: No gross abnormalities noted at the ankle and knee joint; decreased trunk rotation.   Posture: Lumbar lordosis: WNL Iliac crest height: Equal bilaterally Lumbar lateral shift: Negative  AROM AROM (Normal range in degrees) AROM   Lumbar   Flexion (65)   Extension (30)   Right lateral flexion (25)   Left lateral flexion (25)   Right rotation (30)   Left rotation (30)       Hip Right Left  Flexion (125) WNL WNL  Extension (15) WNL WNL  Abduction (40)    Adduction     Internal Rotation (45) WNL WNL  External Rotation (45) WNL WNL      Knee    Flexion (135) WNL WNL  Extension (0) WNL* WNL      Ankle    Dorsiflexion (20)    Plantarflexion (50)    Inversion (35)    Eversion (15)    (* = pain; Blank rows = not tested)  LE MMT: MMT (out of 5) Right  Left   Hip flexion 4-  4-  Hip extension    Hip abduction 4- 4-  Hip adduction 4- 4-  Hip internal rotation 4 4  Hip external rotation 4 4-*  Knee flexion 4 4  Knee extension 5 5  Ankle dorsiflexion 5 5  Ankle plantarflexion 5 5  Ankle inversion    Ankle eversion    (* = pain; Blank rows = not tested)  Sensation Grossly intact to light touch throughout bilateral LEs as determined by testing dermatomes L2-S2. Proprioception, stereognosis, and hot/cold testing deferred on this date.  Reflexes Deferred  Muscle Length Deferred   Palpation Location LEFT  RIGHT           Quadriceps    Medial Hamstrings    Lateral Hamstrings    Lateral Hamstring tendon    Medial Hamstring tendon    Quadriceps tendon    Patella    Patellar Tendon    Tibial Tuberosity    Medial joint line    Lateral joint line    MCL    LCL    Adductor Tubercle    Pes Anserine tendon    Infrapatellar fat pad    Fibular head    Popliteal fossa    (Blank rows = not tested) Graded on 0-4 scale (0 = no pain, 1 = pain, 2 = pain with wincing/grimacing/flinching, 3 = pain with withdrawal, 4 = unwilling to allow palpation), (Blank rows = not tested)  Passive Accessory Motion Deferred to next session  VASCULAR  SPECIAL TESTS  Ligamentous Stability   MCL: Valgus Stress (30 degrees flexion): R: Not done L: Not done  LCL: Varus Stress (30 degrees flexion): R: Not done L: Not done  Meniscus Tests McMurray's Test:  Medial Meniscus (Tibial ER): R: Not done L: Not done Lateral Meniscus (Tibial IR): R: Not done L: Not done  [facility]: R: Not done L: Not done Steinmann Sign I: R: Not done L: Not done Steinmann Sign II: R: Not done L: Not done Effusion: R: Not done L: Not done   Patellofemoral Pain Syndrome Patellar Tilt (Lateral): R: Not done L: Not done Squatting pain: R: Not done L: Not done Stair climbing pain: R: Not done L: Not done Kneeling pain: R: Not done L: Not done Resisted knee extension pain: R: Not done L: Not  done Compression: R: Not done L: Not done Clarke's sign: R: Not done L: Not done Lateral Pull: R: Not done L: Not done  Patellar Tendinopathy Inferior pole palpation with anterior tilt: R: Not done L: Not done   Beighton Scale  LEFT  RIGHT           1. Passive dorsiflexion and hyperextension of the fifth MCP joint beyond 90  0 0   2. Passive apposition of the thumb to the flexor aspect of the forearm  0  0   3. Passive hyperextension of the elbow beyond 10  0  0   4. Passive hyperextension of the knee beyond 10  0  0   5. Active forward flexion of the trunk with the knees fully extended so that the palms of the hands rest flat on the floor   0   TOTAL         0/ 9    [place] Knee Rules for Acute Knee injury  Yes/No         1. Aged 55 years or over Yes  2. Tenderness at the head of the fibula Not tested   3. Isolated tenderness  of the patella  No  4. Inability to flex knee to 90 degrees No  5. Inability to bear weight (defined as an inability to take 4 steps, ie two steps on each leg, regardless of limping) immediately and at presentation No   Ligamentous Stability   MCL: Valgus Stress (30 degrees flexion): R: Negative : Positive with grimace and recoil.    TODAY'S TREATMENT:  Subjective: Patient arrived at physical therapy with no new reports. Patient reported soreness in left knee  No further questions or concerns.     Pain: 1 /10 in the left knee   Ther-ex: NuStep Level 1-4 x 8 min for warm up and interval history, PT adjusted resistance; TRX Partial Squats 2 x 10 BLE; Step ups with 6" step 2 x 10;  Forward Lunge in // bars x 2 trials; Standing Exercises with Chilton Si Theraband: Hip Abduction Steps 2 x 10  Hip Extension Steps 2 x 10  Total Gym Level Level 22   Not Performed:  Supine Bridge with Ball Squeeze 1 x 10;  Supine Bridge with Hip Abduction, Blue Theraband 1 x 10;  Supine Bridge with Marches, alternating LE 2 x 10 steps; Sit to stand with 6# medicine ball  2 x 10;   TotalGym, Level 22 BLE Squat 2 x 10;  Forward Lunge in // bars x multiple lengths (verbal cues for step pattern);  Seated Calf Raises with 7# Dumbbell on BLE 2 x 10;  Heel Drop and Raise on Stairstep with BUE support 1 x 10; Forward Lunge at Steps for Calves Stretch 2 x 5 BLE;    PATIENT EDUCATION:  Education details: Pt educated throughout session about proper posture and technique with exercises. Improved exercise technique, movement at target joints, use of target muscles after min to mod verbal, visual, tactile cues.  Person educated: Patient Education method: Explanation Education comprehension: verbalized understanding   HOME EXERCISE PROGRAM:  Access Code: [facility] URL: [email or web address] Date: 03/06/2023 Prepared by: Ria Comment  Exercises - Seated Hamstring Stretch  - 1 x daily - 7 x weekly - 3 sets - 30 hold - Supine Bridge  - 1 x daily - 7 x weekly - 2-3 sets - 10 reps - 3 hold - Hooklying Clamshell with Resistance  - 1 x daily - 7 x weekly - 2-3 sets - 10 reps - 3 hold - Supine Active Straight Leg Raise  - 1 x daily - 7 x weekly - 2-3 sets - 10 reps - Partial Squat with Counter Top - 1 x daily - 7 x weekly - 2-3 sets - 10 reps - Supine Figure 4 Piriformis Stretch  - 1 x daily - 7 x weekly - 3 sets - 10 reps - Supine Gluteus Stretch  - 1 x daily - 7 x weekly - 3 sets - 10 reps - Supine Hamstring Stretch  - 1 x daily - 7 x weekly - 3 sets - 10 reps   ASSESSMENT:  CLINICAL IMPRESSION: Patient arrived to physical therapy highly motivated. Progressed LE strengthening exercises in today's session. Patient performed TRX squats with good depth without report of additional pain in leg. She is still limited to climbing her stairs at home due to anteromedial pain in her left knee. PT plan to continue strengthening LE.  Pt encouraged to continue HEP. No updates to HEP today.  Pt will continue to benefit from skilled PT services to address deficits in  strength, balance, and mobility in order to return to full function  at home.  OBJECTIVE IMPAIRMENTS: decreased activity tolerance, decreased endurance, difficulty walking, decreased strength, and pain.   ACTIVITY LIMITATIONS: bending, standing, squatting, and stairs  PARTICIPATION LIMITATIONS: shopping and yard work  PERSONAL FACTORS: Age, Fitness, Past/current experiences, and 3+ comorbidities: DDD, GERD, HLD, OP are also affecting patient's functional outcome.   REHAB POTENTIAL: Good  CLINICAL DECISION MAKING: Evolving/moderate complexity  EVALUATION COMPLEXITY: Moderate   GOALS: Goals reviewed with patient? Yes  SHORT TERM GOALS: Target date: 03/26/2023  Pt will be independent with HEP to improve strength and decrease knee pain to improve pain-free function at home and work. Baseline: Verbally reiterated HEP without handout Goal status: Achieved    LONG TERM GOALS: Target date: 04/09/2023  Pt will increase FOTO to at least 59 to demonstrate significant improvement in function at home and work related to knee pain  Baseline: Eval: 40% Goal status: INITIAL  2.  Pt will decrease worst knee pain by at least 3 points on the NPRS in order to demonstrate clinically significant reduction in back pain. Baseline: Eval: 9/10 Goal status: INITIAL  3.  Pt will increase strength of BLE hip abductors by at least 1/2 MMT grade in order to demonstrate improvement in strength and function  Baseline: Eval: 4-/5 Goal status: INITIAL  4.  Pt will demonstrate stairway navigation without knee valgus and report of pain in order to demonstrate improvement in functional mobility and ability to navigate to the second floor of her house. Baseline: n/a Goal status: INITIAL    PLAN: PT FREQUENCY: 1-2x/week  PT DURATION: 8 weeks  PLANNED INTERVENTIONS: Therapeutic exercises, Therapeutic activity, Neuromuscular re-education, Balance training, Gait training, Patient/Family education, Self Care,  Joint mobilization, Joint manipulation, Vestibular training, Canalith repositioning, Orthotic/Fit training, DME instructions, Dry Needling, Electrical stimulation, Spinal manipulation, Spinal mobilization, Cryotherapy, Moist heat, Taping, Traction, Ultrasound, Ionotophoresis 4mg /ml Dexamethasone, Manual therapy, and Re-evaluation.  PLAN FOR NEXT SESSION:  Stairway navigation, Squatting, Continue Hip and Knee strengthening, review and modify HEP  Temitope Griffing, SPT Lynnea Maizes PT, DPT, GCS  Physical Therapist -   [facility]

## 2023-03-26 ENCOUNTER — Ambulatory Visit: Payer: Medicare PPO

## 2023-03-26 DIAGNOSIS — G8929 Other chronic pain: Secondary | ICD-10-CM | POA: Diagnosis not present

## 2023-03-26 DIAGNOSIS — M6281 Muscle weakness (generalized): Secondary | ICD-10-CM

## 2023-03-26 DIAGNOSIS — M25562 Pain in left knee: Secondary | ICD-10-CM | POA: Diagnosis not present

## 2023-03-26 NOTE — Therapy (Addendum)
OUTPATIENT PHYSICAL THERAPY THORACOLUMBAR TREATMENT   Patient Name: Angie Copeland MRN: [id number] DOB:08/02/1950, 72 y.o., female Today's Date: 03/26/2023  END OF SESSION:  PT End of Session - 03/26/23 0805     Visit Number 7    Number of Visits 9    Date for PT Re-Evaluation 04/09/23    PT Start Time 0802    PT Stop Time 0845    PT Time Calculation (min) 43 min    Activity Tolerance Patient tolerated treatment well    Behavior During Therapy [facility] for tasks assessed/performed            Past Medical History:  Diagnosis Date   Allergy    DDD (degenerative disc disease), lumbar    Esophageal mass 2015   Found incidently on CXR- Saw ENT and for visualization and nothing was there.    GERD (gastroesophageal reflux disease)    Hyperlipidemia    Osteoporosis    Past Surgical History:  Procedure Laterality Date   COLONOSCOPY WITH PROPOFOL N/A 02/06/2021   Procedure: COLONOSCOPY WITH PROPOFOL;  Surgeon: Pasty Spillers, MD;  Location: [facility] ENDOSCOPY;  Service: Endoscopy;  Laterality: N/A;   EYE SURGERY     cataract surgery    NOSE SURGERY     Cartilage removed from left side of nose   Patient Active Problem List   Diagnosis Date Noted   Acute pain of left knee 01/27/2023   Family history of colon cancer    Cecal polyp    Polyp of ascending colon    Vitamin D deficiency 12/19/2016   Advance directive discussed with patient 12/17/2016   Rosacea 10/15/2016   Allergic rhinitis 10/15/2016   GE reflux 10/15/2016   Hyperlipidemia, unspecified 10/15/2016   Osteoporosis 10/15/2016   Fever blister 10/15/2016   Anxiety 10/15/2016   DDD (degenerative disc disease), lumbar 01/20/2012    PCP: Dorcas Carrow, DO  REFERRING PROVIDER: Juanell Fairly, MD  REFERRING DIAG: Sprain of Medial Collateral Ligament of Left Knee Q65.784O; Chondromalacia Patellae, Left Knee M22.42  RATIONALE FOR EVALUATION AND TREATMENT: Rehabilitation  THERAPY DIAG: Muscle weakness  (generalized)  Chronic pain of left knee  ONSET DATE: 01/11/2023  FOLLOW-UP APPT SCHEDULED WITH REFERRING PROVIDER:  Did Not Address   SUBJECTIVE:                                                                                                                                                                                         SUBJECTIVE STATEMENT: "I've been having pain in my left knee following a bad step on the stairs. My doctor has mentioned that I have sprained the ligament in  my left knee."  PERTINENT HISTORY:  Patient reports walking down the steps 01/11/2023, "I stepped wrong and I had this shooting pain in my left leg. A history of lumbar DDD, low back pain, left hip pain, left knee pain and left foot pain. The pain can radiate from her back all the way down to her left foot. She reports associated numbness, tingling and weakness in the left lower extremity. She reports that walking on harder surfaces (I.e. the store, cement, etc) for prolonged time. She denies loss of bowel or bladder control. She denies recent injury and has never had back surgery.  She has tried Tylenol OTC with minimal relief of symptoms.   PAIN:    Pain Intensity: Present: 5/10, Best: 2/10, Worst: 9/10 Pain location: Left Anteromedial Pain  Pain Quality: aching  Radiating: Yes, radiates superior to the hip  Numbness/Tingling: Yes, bilateral feet   Focal Weakness: Yes Pt reports "knee feels like it will give out in the shower"  Aggravating factors: "Harder Floors", Prolonged Walking Relieving factors: Rest, Elevation, Massage 24-hour pain behavior: Increased pain with prolonged activities throughout the day.  History of prior back injury, pain, surgery, or therapy: Yes Dominant hand: right Imaging: No  Red flags: Negative for bowel/bladder changes, saddle paresthesia, personal history of cancer, h/o spinal tumors, h/o compression fx, h/o abdominal aneurysm, abdominal pain, chills/fever, night sweats,  nausea, vomiting, unrelenting pain,  PRECAUTIONS: None  WEIGHT BEARING RESTRICTIONS: No  FALLS: Has patient fallen in last 6 months? No  Living Environment Lives with: lives alone Lives in: House/apartment Stairs: Yes: Internal: 12 steps; on left going up Has following equipment at home: Dan Humphreys - 2 wheeled  Prior level of function: Independent  Occupational demands: Retired  Presenter, broadcasting: Social research officer, government, Walking with friend (30 min), Chair Yoga   Patient Goals: Patient would like to walk without pain and return to walking with my friend.    OBJECTIVE:  Patient Surveys  FOTO 40%; Predicted Value 59%  Cognition Patient is oriented to person, place, and time.  Recent memory is intact.  Remote memory is intact.  Attention span and concentration are intact.  Expressive speech is intact.  Patient's fund of knowledge is within normal limits for educational level.    Gross Musculoskeletal Assessment Tremor: None Tone: Normal Noted minor swelling and slightly darker skin tone in medial LLE.   GAIT: Distance walked: 10 Assistive device utilized: None Level of assistance: Complete Independence Comments: No gross abnormalities noted at the ankle and knee joint; decreased trunk rotation.   Posture: Lumbar lordosis: WNL Iliac crest height: Equal bilaterally Lumbar lateral shift: Negative  AROM AROM (Normal range in degrees) AROM   Lumbar   Flexion (65)   Extension (30)   Right lateral flexion (25)   Left lateral flexion (25)   Right rotation (30)   Left rotation (30)       Hip Right Left  Flexion (125) WNL WNL  Extension (15) WNL WNL  Abduction (40)    Adduction     Internal Rotation (45) WNL WNL  External Rotation (45) WNL WNL      Knee    Flexion (135) WNL WNL  Extension (0) WNL* WNL      Ankle    Dorsiflexion (20)    Plantarflexion (50)    Inversion (35)    Eversion (15)    (* = pain; Blank rows = not tested)  LE MMT: MMT (out of 5) Right  Left   Hip  flexion 4- 4-  Hip extension    Hip abduction 4- 4-  Hip adduction 4- 4-  Hip internal rotation 4 4  Hip external rotation 4 4-*  Knee flexion 4 4  Knee extension 5 5  Ankle dorsiflexion 5 5  Ankle plantarflexion 5 5  Ankle inversion    Ankle eversion    (* = pain; Blank rows = not tested)  Sensation Grossly intact to light touch throughout bilateral LEs as determined by testing dermatomes L2-S2. Proprioception, stereognosis, and hot/cold testing deferred on this date.  Reflexes Deferred  Muscle Length Deferred   Palpation Location LEFT  RIGHT           Quadriceps    Medial Hamstrings    Lateral Hamstrings    Lateral Hamstring tendon    Medial Hamstring tendon    Quadriceps tendon    Patella    Patellar Tendon    Tibial Tuberosity    Medial joint line    Lateral joint line    MCL    LCL    Adductor Tubercle    Pes Anserine tendon    Infrapatellar fat pad    Fibular head    Popliteal fossa    (Blank rows = not tested) Graded on 0-4 scale (0 = no pain, 1 = pain, 2 = pain with wincing/grimacing/flinching, 3 = pain with withdrawal, 4 = unwilling to allow palpation), (Blank rows = not tested)  Passive Accessory Motion Deferred to next session  VASCULAR  SPECIAL TESTS  Ligamentous Stability   MCL: Valgus Stress (30 degrees flexion): R: Not done L: Not done  LCL: Varus Stress (30 degrees flexion): R: Not done L: Not done  Meniscus Tests McMurray's Test:  Medial Meniscus (Tibial ER): R: Not done L: Not done Lateral Meniscus (Tibial IR): R: Not done L: Not done  [facility]: R: Not done L: Not done Steinmann Sign I: R: Not done L: Not done Steinmann Sign II: R: Not done L: Not done Effusion: R: Not done L: Not done   Patellofemoral Pain Syndrome Patellar Tilt (Lateral): R: Not done L: Not done Squatting pain: R: Not done L: Not done Stair climbing pain: R: Not done L: Not done Kneeling pain: R: Not done L: Not done Resisted knee extension pain: R: Not  done L: Not done Compression: R: Not done L: Not done Clarke's sign: R: Not done L: Not done Lateral Pull: R: Not done L: Not done  Patellar Tendinopathy Inferior pole palpation with anterior tilt: R: Not done L: Not done   Beighton Scale  LEFT  RIGHT           1. Passive dorsiflexion and hyperextension of the fifth MCP joint beyond 90  0 0   2. Passive apposition of the thumb to the flexor aspect of the forearm  0  0   3. Passive hyperextension of the elbow beyond 10  0  0   4. Passive hyperextension of the knee beyond 10  0  0   5. Active forward flexion of the trunk with the knees fully extended so that the palms of the hands rest flat on the floor   0   TOTAL         0/ 9    [place] Knee Rules for Acute Knee injury  Yes/No         1. Aged 55 years or over Yes  2. Tenderness at the head of the fibula Not tested   3. Isolated tenderness of the  patella  No  4. Inability to flex knee to 90 degrees No  5. Inability to bear weight (defined as an inability to take 4 steps, ie two steps on each leg, regardless of limping) immediately and at presentation No   Ligamentous Stability   MCL: Valgus Stress (30 degrees flexion): R: Negative : Positive with grimace and recoil.    TODAY'S TREATMENT:  Subjective: Patient arrived at physical therapy with no new reports. Patient with improved knee pain but has started wearing an insole on her left foot and lately her left hip has developed some soreness.   No further questions or concerns.     Pain: 0/10 in the left knee   Ther-ex: NuStep Level 1-4 x 6 min for warm up and interval history, PT adjusted resistance; Lateral Band Walks with Red Theraband in // bars x 4 trials; Step Down with 6" step 2 x 10 BLE;  TRX Partial Squats 2 x 10 BLE; Forward Lunge on first step with BUE support 2 x 6;   Total Gym Level 22 BLE Squat with Red Theraband around thighs; Seated LAQ with ball squeeze 1 x 10 BLE;  Not Performed:  Supine Bridge with  Ball Squeeze 1 x 10;  Supine Bridge with Hip Abduction, Blue Theraband 1 x 10;  Supine Bridge with Marches, alternating LE 2 x 10 steps; Sit to stand with 6# medicine ball 2 x 10;   TotalGym, Level 22 BLE Squat 2 x 10;  Forward Lunge in // bars x multiple lengths (verbal cues for step pattern);  Seated Calf Raises with 7# Dumbbell on BLE 2 x 10;  Heel Drop and Raise on Stairstep with BUE support 1 x 10; Forward Lunge at Steps for Calves Stretch 2 x 5 BLE;    PATIENT EDUCATION:  Education details: Pt educated throughout session about proper posture and technique with exercises. Improved exercise technique, movement at target joints, use of target muscles after min to mod verbal, visual, tactile cues.  Person educated: Patient Education method: Explanation Education comprehension: verbalized understanding   HOME EXERCISE PROGRAM:  Access Code: [facility] URL: [email or web address] Date: 03/06/2023 Prepared by: Ria Comment  Exercises - Seated Hamstring Stretch  - 1 x daily - 7 x weekly - 3 sets - 30 hold - Supine Bridge  - 1 x daily - 7 x weekly - 2-3 sets - 10 reps - 3 hold - Hooklying Clamshell with Resistance  - 1 x daily - 7 x weekly - 2-3 sets - 10 reps - 3 hold - Supine Active Straight Leg Raise  - 1 x daily - 7 x weekly - 2-3 sets - 10 reps - Partial Squat with Counter Top - 1 x daily - 7 x weekly - 2-3 sets - 10 reps - Supine Figure 4 Piriformis Stretch  - 1 x daily - 7 x weekly - 3 sets - 10 reps - Supine Gluteus Stretch  - 1 x daily - 7 x weekly - 3 sets - 10 reps - Supine Hamstring Stretch  - 1 x daily - 7 x weekly - 3 sets - 10 reps   ASSESSMENT:  CLINICAL IMPRESSION: Patient arrived to physical therapy highly motivated. Progressed LE strengthening exercises in today's session. Patient performed forward step down at stairs and TRX squats with proper biomechanics and no additional reports of pain at the knee. She's recent'y obtained an insole for her left  shoe but has started developing left hip soreness. PT educated patient on tapering insole wear  for comfort. Pt endorsed discharging from PT next appointment. Pt encouraged to continue HEP. No updates to HEP today.  Pt will continue to benefit from skilled PT services to address deficits in strength, balance, and mobility in order to return to full function at home.  OBJECTIVE IMPAIRMENTS: decreased activity tolerance, decreased endurance, difficulty walking, decreased strength, and pain.   ACTIVITY LIMITATIONS: bending, standing, squatting, and stairs  PARTICIPATION LIMITATIONS: shopping and yard work  PERSONAL FACTORS: Age, Fitness, Past/current experiences, and 3+ comorbidities: DDD, GERD, HLD, OP are also affecting patient's functional outcome.   REHAB POTENTIAL: Good  CLINICAL DECISION MAKING: Evolving/moderate complexity  EVALUATION COMPLEXITY: Moderate   GOALS: Goals reviewed with patient? Yes  SHORT TERM GOALS: Target date: 03/26/2023  Pt will be independent with HEP to improve strength and decrease knee pain to improve pain-free function at home and work. Baseline: Verbally reiterated HEP without handout Goal status: Achieved    LONG TERM GOALS: Target date: 04/09/2023  Pt will increase FOTO to at least 59 to demonstrate significant improvement in function at home and work related to knee pain  Baseline: Eval: 40% Goal status: INITIAL  2.  Pt will decrease worst knee pain by at least 3 points on the NPRS in order to demonstrate clinically significant reduction in back pain. Baseline: Eval: 9/10 Goal status: INITIAL  3.  Pt will increase strength of BLE hip abductors by at least 1/2 MMT grade in order to demonstrate improvement in strength and function  Baseline: Eval: 4-/5 Goal status: INITIAL  4.  Pt will demonstrate stairway navigation without knee valgus and report of pain in order to demonstrate improvement in functional mobility and ability to navigate to the  second floor of her house. Baseline: n/a Goal status: INITIAL    PLAN: PT FREQUENCY: 1-2x/week  PT DURATION: 8 weeks  PLANNED INTERVENTIONS: Therapeutic exercises, Therapeutic activity, Neuromuscular re-education, Balance training, Gait training, Patient/Family education, Self Care, Joint mobilization, Joint manipulation, Vestibular training, Canalith repositioning, Orthotic/Fit training, DME instructions, Dry Needling, Electrical stimulation, Spinal manipulation, Spinal mobilization, Cryotherapy, Moist heat, Taping, Traction, Ultrasound, Ionotophoresis 4mg /ml Dexamethasone, Manual therapy, and Re-evaluation.  PLAN FOR NEXT SESSION:  Update outcome measures/goals, discharge, stairway navigation, Squatting, Continue Hip and Knee strengthening, review and modify HEP  Cristal Deer Genevie Elman, SPT Lynnea Maizes PT, DPT, GCS  Physical Therapist - [place]  [facility]

## 2023-04-02 ENCOUNTER — Ambulatory Visit: Payer: Medicare PPO

## 2023-04-02 DIAGNOSIS — M6281 Muscle weakness (generalized): Secondary | ICD-10-CM

## 2023-04-02 DIAGNOSIS — G8929 Other chronic pain: Secondary | ICD-10-CM

## 2023-04-02 DIAGNOSIS — M25562 Pain in left knee: Secondary | ICD-10-CM | POA: Diagnosis not present

## 2023-04-02 NOTE — Therapy (Cosign Needed)
OUTPATIENT PHYSICAL THERAPY THORACOLUMBAR DISCHARGE   Patient Name: Angie Copeland MRN: [id number] DOB:09/02/1950, 72 y.o., female Today's Date: 04/03/2023  END OF SESSION:  PT End of Session - 04/02/23 0803     Visit Number 8    Number of Visits 9    Date for PT Re-Evaluation 04/09/23    PT Start Time 0805    PT Stop Time 0850    PT Time Calculation (min) 45 min    Activity Tolerance Patient tolerated treatment well    Behavior During Therapy [facility] for tasks assessed/performed            Past Medical History:  Diagnosis Date   Allergy    DDD (degenerative disc disease), lumbar    Esophageal mass 2015   Found incidently on CXR- Saw ENT and for visualization and nothing was there.    GERD (gastroesophageal reflux disease)    Hyperlipidemia    Osteoporosis    Past Surgical History:  Procedure Laterality Date   COLONOSCOPY WITH PROPOFOL N/A 02/06/2021   Procedure: COLONOSCOPY WITH PROPOFOL;  Surgeon: Pasty Spillers, MD;  Location: [facility] ENDOSCOPY;  Service: Endoscopy;  Laterality: N/A;   EYE SURGERY     cataract surgery    NOSE SURGERY     Cartilage removed from left side of nose   Patient Active Problem List   Diagnosis Date Noted   Acute pain of left knee 01/27/2023   Family history of colon cancer    Cecal polyp    Polyp of ascending colon    Vitamin D deficiency 12/19/2016   Advance directive discussed with patient 12/17/2016   Rosacea 10/15/2016   Allergic rhinitis 10/15/2016   GE reflux 10/15/2016   Hyperlipidemia, unspecified 10/15/2016   Osteoporosis 10/15/2016   Fever blister 10/15/2016   Anxiety 10/15/2016   DDD (degenerative disc disease), lumbar 01/20/2012    PCP: Dorcas Carrow, DO  REFERRING PROVIDER: Juanell Fairly, MD  REFERRING DIAG: Sprain of Medial Collateral Ligament of Left Knee W09.811B; Chondromalacia Patellae, Left Knee M22.42  RATIONALE FOR EVALUATION AND TREATMENT: Rehabilitation  THERAPY DIAG: Chronic pain of left  knee  Muscle weakness (generalized)  ONSET DATE: 01/11/2023  FOLLOW-UP APPT SCHEDULED WITH REFERRING PROVIDER:  Did Not Address   SUBJECTIVE:                                                                                                                                                                                         SUBJECTIVE STATEMENT: "I've been having pain in my left knee following a bad step on the stairs. My doctor has mentioned that I have sprained the ligament in  my left knee."  PERTINENT HISTORY:  Patient reports walking down the steps 01/11/2023, "I stepped wrong and I had this shooting pain in my left leg. A history of lumbar DDD, low back pain, left hip pain, left knee pain and left foot pain. The pain can radiate from her back all the way down to her left foot. She reports associated numbness, tingling and weakness in the left lower extremity. She reports that walking on harder surfaces (I.e. the store, cement, etc) for prolonged time. She denies loss of bowel or bladder control. She denies recent injury and has never had back surgery.  She has tried Tylenol OTC with minimal relief of symptoms.   PAIN:    Pain Intensity: Present: 5/10, Best: 2/10, Worst: 9/10 Pain location: Left Anteromedial Pain  Pain Quality: aching  Radiating: Yes, radiates superior to the hip  Numbness/Tingling: Yes, bilateral feet   Focal Weakness: Yes Pt reports "knee feels like it will give out in the shower"  Aggravating factors: "Harder Floors", Prolonged Walking Relieving factors: Rest, Elevation, Massage 24-hour pain behavior: Increased pain with prolonged activities throughout the day.  History of prior back injury, pain, surgery, or therapy: Yes Dominant hand: right Imaging: No  Red flags: Negative for bowel/bladder changes, saddle paresthesia, personal history of cancer, h/o spinal tumors, h/o compression fx, h/o abdominal aneurysm, abdominal pain, chills/fever, night sweats, nausea,  vomiting, unrelenting pain,  PRECAUTIONS: None  WEIGHT BEARING RESTRICTIONS: No  FALLS: Has patient fallen in last 6 months? No  Living Environment Lives with: lives alone Lives in: House/apartment Stairs: Yes: Internal: 12 steps; on left going up Has following equipment at home: Angie Copeland - 2 wheeled  Prior level of function: Independent  Occupational demands: Retired  Presenter, broadcasting: Social research officer, government, Walking with friend (30 min), Chair Yoga   Patient Goals: Patient would like to walk without pain and return to walking with my friend.    OBJECTIVE:  Patient Surveys  FOTO 40%; Predicted Value 59%  Cognition Patient is oriented to person, place, and time.  Recent memory is intact.  Remote memory is intact.  Attention span and concentration are intact.  Expressive speech is intact.  Patient's fund of knowledge is within normal limits for educational level.    Gross Musculoskeletal Assessment Tremor: None Tone: Normal Noted minor swelling and slightly darker skin tone in medial LLE.   GAIT: Distance walked: 10 Assistive device utilized: None Level of assistance: Complete Independence Comments: No gross abnormalities noted at the ankle and knee joint; decreased trunk rotation.   Posture: Lumbar lordosis: WNL Iliac crest height: Equal bilaterally Lumbar lateral shift: Negative  AROM AROM (Normal range in degrees) AROM   Lumbar   Flexion (65)   Extension (30)   Right lateral flexion (25)   Left lateral flexion (25)   Right rotation (30)   Left rotation (30)       Hip Right Left  Flexion (125) WNL WNL  Extension (15) WNL WNL  Abduction (40)    Adduction     Internal Rotation (45) WNL WNL  External Rotation (45) WNL WNL      Knee    Flexion (135) WNL WNL  Extension (0) WNL* WNL      Ankle    Dorsiflexion (20)    Plantarflexion (50)    Inversion (35)    Eversion (15)    (* = pain; Blank rows = not tested)  LE MMT: MMT (out of 5) Right  Left   Hip flexion 4-  4-  Hip extension    Hip abduction 4- 4-  Hip adduction 4- 4-  Hip internal rotation 4 4  Hip external rotation 4 4-*  Knee flexion 4 4  Knee extension 5 5  Ankle dorsiflexion 5 5  Ankle plantarflexion 5 5  Ankle inversion    Ankle eversion    (* = pain; Blank rows = not tested)  Sensation Grossly intact to light touch throughout bilateral LEs as determined by testing dermatomes L2-S2. Proprioception, stereognosis, and hot/cold testing deferred on this date.  Reflexes Deferred  Muscle Length Deferred   Palpation Location LEFT  RIGHT           Quadriceps    Medial Hamstrings    Lateral Hamstrings    Lateral Hamstring tendon    Medial Hamstring tendon    Quadriceps tendon    Patella    Patellar Tendon    Tibial Tuberosity    Medial joint line    Lateral joint line    MCL    LCL    Adductor Tubercle    Pes Anserine tendon    Infrapatellar fat pad    Fibular head    Popliteal fossa    (Blank rows = not tested) Graded on 0-4 scale (0 = no pain, 1 = pain, 2 = pain with wincing/grimacing/flinching, 3 = pain with withdrawal, 4 = unwilling to allow palpation), (Blank rows = not tested)  Passive Accessory Motion Deferred to next session  VASCULAR  SPECIAL TESTS  Ligamentous Stability   MCL: Valgus Stress (30 degrees flexion): R: Not done L: Not done  LCL: Varus Stress (30 degrees flexion): R: Not done L: Not done  Meniscus Tests McMurray's Test:  Medial Meniscus (Tibial ER): R: Not done L: Not done Lateral Meniscus (Tibial IR): R: Not done L: Not done  [facility]: R: Not done L: Not done Steinmann Sign I: R: Not done L: Not done Steinmann Sign II: R: Not done L: Not done Effusion: R: Not done L: Not done   Patellofemoral Pain Syndrome Patellar Tilt (Lateral): R: Not done L: Not done Squatting pain: R: Not done L: Not done Stair climbing pain: R: Not done L: Not done Kneeling pain: R: Not done L: Not done Resisted knee extension pain: R: Not done L: Not  done Compression: R: Not done L: Not done Clarke's sign: R: Not done L: Not done Lateral Pull: R: Not done L: Not done  Patellar Tendinopathy Inferior pole palpation with anterior tilt: R: Not done L: Not done   Beighton Scale  LEFT  RIGHT           1. Passive dorsiflexion and hyperextension of the fifth MCP joint beyond 90  0 0   2. Passive apposition of the thumb to the flexor aspect of the forearm  0  0   3. Passive hyperextension of the elbow beyond 10  0  0   4. Passive hyperextension of the knee beyond 10  0  0   5. Active forward flexion of the trunk with the knees fully extended so that the palms of the hands rest flat on the floor   0   TOTAL         0/ 9    [place] Knee Rules for Acute Knee injury  Yes/No         1. Aged 55 years or over Yes  2. Tenderness at the head of the fibula Not tested   3. Isolated tenderness of the  patella  No  4. Inability to flex knee to 90 degrees No  5. Inability to bear weight (defined as an inability to take 4 steps, ie two steps on each leg, regardless of limping) immediately and at presentation No   Ligamentous Stability   MCL: Valgus Stress (30 degrees flexion): R: Negative : Positive with grimace and recoil.    TODAY'S TREATMENT:  Subjective: Patient arrived at physical therapy highly motivated. Patient reports improvements with walking with insole in shoes within the last week. No further questions or concerns.     Pain: 0/10 in the left knee   Ther-ex: NuStep Level 1- 4 x 8 min for warm up and interval history, PT adjusted resistance; Hooklying Hip Abduction against manual resistance 1 x 10 BLE;  Supine SLR with 2# Ankle Weight 2 x 10 x 3s hold; Seated Knee Extension and Flexion against manual resistance 2 x 10 BLE;  Lateral Step Downs 6" step with BUE Support 1 x 10 BLE; Partial Squat 1 x 10 (tactile and verbal cues for form);   Functional Outcomes Reassessed:  FOTO: 59 MMT (Hip Abductors): 4/5 BLE NRPS: 5/10  Not  Performed:  Supine Bridge with Ball Squeeze 1 x 10;  Supine Bridge with Hip Abduction, Blue Theraband 1 x 10;  Supine Bridge with Marches, alternating LE 2 x 10 steps; Sit to stand with 6# medicine ball 2 x 10;   TotalGym, Level 22 BLE Squat 2 x 10;  Forward Lunge in // bars x multiple lengths (verbal cues for step pattern);  Seated Calf Raises with 7# Dumbbell on BLE 2 x 10;  Heel Drop and Raise on Stairstep with BUE support 1 x 10; Forward Lunge at Steps for Calves Stretch 2 x 5 BLE;    PATIENT EDUCATION:  Education details: Pt educated throughout session about proper posture and technique with exercises. Improved exercise technique, movement at target joints, use of target muscles after min to mod verbal, visual, tactile cues.  Person educated: Patient Education method: Explanation Education comprehension: verbalized understanding   HOME EXERCISE PROGRAM:  Access Code: [facility] URL: [email or web address] Date: 03/06/2023 Prepared by: Ria Comment  Exercises - Seated Hamstring Stretch  - 1 x daily - 7 x weekly - 3 sets - 30 hold - Supine Bridge  - 1 x daily - 7 x weekly - 2-3 sets - 10 reps - 3 hold - Hooklying Clamshell with Resistance  - 1 x daily - 7 x weekly - 2-3 sets - 10 reps - 3 hold - Supine Active Straight Leg Raise  - 1 x daily - 7 x weekly - 2-3 sets - 10 reps - Partial Squat with Counter Top - 1 x daily - 7 x weekly - 2-3 sets - 10 reps - Supine Figure 4 Piriformis Stretch  - 1 x daily - 7 x weekly - 3 sets - 10 reps - Supine Gluteus Stretch  - 1 x daily - 7 x weekly - 3 sets - 10 reps - Supine Hamstring Stretch  - 1 x daily - 7 x weekly - 3 sets - 10 reps   ASSESSMENT:  CLINICAL IMPRESSION: Angie Copeland is a 72 year old female who presented to physical therapy with a chief concern of knee pain. She was limited with functional activities such as squatting, stairway navigation and prolonged standing. Currently she has demonstrated overall improvements  with LE strength and stability. Today's session with a main focus of progressing goals and progressing LE strength. Patient self reported  59 (previously 40) on FOTO indicating improvements with functional activity. She's demonstrated improvements with LE strength (4/5 with hip abduction) and stability as she demonstrated stairway navigation without knee valgus and minor pain. Her worst pain reported within the last week was a 5/10 while descending stairs with immediate relief once she corrected her form. Recently she endorsed being able to dance without pain or LOB. Remainder of the session PT discussed comprehensive home exercise program for home and gym environment. Overall, she has demonstrated significant improvements with increased confidence in stability of her knee. PT recommended discharge to home with comprehensive home exercise program.   OBJECTIVE IMPAIRMENTS: decreased activity tolerance, decreased endurance, difficulty walking, decreased strength, and pain.   ACTIVITY LIMITATIONS: bending, standing, squatting, and stairs  PARTICIPATION LIMITATIONS: shopping and yard work  PERSONAL FACTORS: Age, Fitness, Past/current experiences, and 3+ comorbidities: DDD, GERD, HLD, OP are also affecting patient's functional outcome.   REHAB POTENTIAL: Good  CLINICAL DECISION MAKING: Evolving/moderate complexity  EVALUATION COMPLEXITY: Moderate   GOALS: Goals reviewed with patient? Yes  SHORT TERM GOALS: Target date: 03/26/2023  Pt will be independent with HEP to improve strength and decrease knee pain to improve pain-free function at home and work. Baseline: Verbally reiterated HEP without handout Goal status: Achieved    LONG TERM GOALS: Target date: 04/09/2023  Pt will increase FOTO to at least 59 to demonstrate significant improvement in function at home and work related to knee pain  Baseline: Eval: 40%, 04/02/2023: 59 Goal status: Goal Met   2.  Pt will decrease worst knee pain by  at least 3 points on the NPRS in order to demonstrate clinically significant reduction in back pain. Baseline: Eval: 9/10, 04/02/2023: 5/10 Goal status: Goal Met  3.  Pt will increase strength of BLE hip abductors by at least 1/2 MMT grade in order to demonstrate improvement in strength and function  Baseline: Eval: 4-/5, 04/02/2023: 4/5 Goal status: Goal Met   4.  Pt will demonstrate stairway navigation without knee valgus and report of pain in order to demonstrate improvement in functional mobility and ability to navigate to the second floor of her house. Baseline: Navigated without knee valgus and minor report of pain Goal status: Progressing     PLAN: PT FREQUENCY: 1-2x/week  PT DURATION: 8 weeks  PLANNED INTERVENTIONS: Therapeutic exercises, Therapeutic activity, Neuromuscular re-education, Balance training, Gait training, Patient/Family education, Self Care, Joint mobilization, Joint manipulation, Vestibular training, Canalith repositioning, Orthotic/Fit training, DME instructions, Dry Needling, Electrical stimulation, Spinal manipulation, Spinal mobilization, Cryotherapy, Moist heat, Taping, Traction, Ultrasound, Ionotophoresis 4mg /ml Dexamethasone, Manual therapy, and Re-evaluation.  PLAN FOR NEXT SESSION:  Discharge  Cristal Deer Azarion Hove, SPT Lynnea Maizes PT, DPT, GCS  Physical Therapist - [place]  [facility]

## 2023-05-19 ENCOUNTER — Ambulatory Visit: Payer: Medicare PPO | Admitting: Family Medicine

## 2023-05-19 ENCOUNTER — Encounter: Payer: Self-pay | Admitting: Family Medicine

## 2023-05-19 VITALS — BP 153/77 | HR 67 | Ht 63.5 in | Wt 162.2 lb

## 2023-05-19 DIAGNOSIS — E559 Vitamin D deficiency, unspecified: Secondary | ICD-10-CM | POA: Diagnosis not present

## 2023-05-19 DIAGNOSIS — R030 Elevated blood-pressure reading, without diagnosis of hypertension: Secondary | ICD-10-CM

## 2023-05-19 DIAGNOSIS — Z23 Encounter for immunization: Secondary | ICD-10-CM | POA: Diagnosis not present

## 2023-05-19 DIAGNOSIS — E782 Mixed hyperlipidemia: Secondary | ICD-10-CM | POA: Diagnosis not present

## 2023-05-19 DIAGNOSIS — F419 Anxiety disorder, unspecified: Secondary | ICD-10-CM | POA: Diagnosis not present

## 2023-05-19 DIAGNOSIS — Z1231 Encounter for screening mammogram for malignant neoplasm of breast: Secondary | ICD-10-CM

## 2023-05-19 DIAGNOSIS — Z0000 Encounter for general adult medical examination without abnormal findings: Secondary | ICD-10-CM

## 2023-05-19 DIAGNOSIS — E785 Hyperlipidemia, unspecified: Secondary | ICD-10-CM | POA: Diagnosis not present

## 2023-05-19 LAB — MICROALBUMIN, URINE WAIVED
Creatinine, Urine Waived: 50 mg/dL (ref 10–300)
Microalb, Ur Waived: 30 mg/L — ABNORMAL HIGH (ref 0–19)

## 2023-05-19 MED ORDER — DICLOFENAC SODIUM 1 % EX GEL: 4.0000 g | Freq: Four times a day (QID) | CUTANEOUS | 12 refills | Status: AC

## 2023-05-19 NOTE — Assessment & Plan Note (Signed)
Rechecking labs today. Await results. Treat as needed.  °

## 2023-05-19 NOTE — Assessment & Plan Note (Signed)
Stable off medicine. Continue to monitor. Call with any concerns.  

## 2023-05-19 NOTE — Progress Notes (Signed)
BP (!) 153/77   Pulse 67   Ht 5' 3.5" (1.613 m)   Wt 162 lb 3.2 oz (73.6 kg)   SpO2 98%   BMI 28.28 kg/m    Subjective:    Patient ID: Angie Copeland, female    DOB: 06/20/1951, 72 y.o.   MRN: [id number]  HPI: IRACEMA HEIDEBRECHT is a 72 y.o. female presenting on 05/19/2023 for comprehensive medical examination. Current medical complaints include:  Has woken up with headaches a couple of times. Has been having some floaters in her vision. Has not seen her eye doctor.   GERD GERD control status: worse Satisfied with current treatment? yes Heartburn frequency: with food choices Medication side effects: N/A  Medication compliance: N/A Dysphagia: no Odynophagia:  no Hematemesis: no Blood in stool: no EGD: no  HYPERLIPIDEMIA Hyperlipidemia status: stable Satisfied with current treatment?  yes Side effects:  N/A Past cholesterol meds: none Supplements: none Aspirin:  no The 10-year ASCVD risk score (Arnett DK, et al., 2019) is: 18.2%   Values used to calculate the score:     Age: 29 years     Sex: Female     Is Non-Hispanic African American: No     Diabetic: No     Tobacco smoker: No     Systolic Blood Pressure: 153 mmHg     Is BP treated: No     HDL Cholesterol: 91 mg/dL     Total Cholesterol: 267 mg/dL Chest pain:  no  Menopausal Symptoms: no  Functional Status Survey: Is the patient deaf or have difficulty hearing?: No Does the patient have difficulty seeing, even when wearing glasses/contacts?: No Does the patient have difficulty concentrating, remembering, or making decisions?: No Does the patient have difficulty walking or climbing stairs?: Yes Does the patient have difficulty dressing or bathing?: No Does the patient have difficulty doing errands alone such as visiting a doctor's office or shopping?: No     05/19/2023    8:30 AM 11/07/2022    9:05 AM 06/11/2022    3:30 PM 05/17/2022   11:23 AM 05/14/2022    8:25 AM  Fall Risk   Falls in the past year? 0 0 0 0  0  Number falls in past yr: 0 0 0 0 0  Injury with Fall? 0 0 0 0 0  Risk for fall due to : No Fall Risks No Fall Risks No Fall Risks No Fall Risks No Fall Risks  Follow up Falls evaluation completed Falls evaluation completed Falls evaluation completed Falls evaluation completed Falls evaluation completed    Depression Screen    05/19/2023    8:31 AM 11/07/2022    9:05 AM 06/11/2022    3:29 PM 05/17/2022   11:24 AM 05/14/2022    8:25 AM  Depression screen PHQ 2/9  Decreased Interest 0 0 0 0 0  Down, Depressed, Hopeless 0 0 0 0 0  PHQ - 2 Score 0 0 0 0 0  Altered sleeping 1 3 2  0 0  Tired, decreased energy 1 1 0 1 0  Change in appetite 1 2 0 0 0  Feeling bad or failure about yourself  0 0 0 0 0  Trouble concentrating 0 0 0 0 0  Moving slowly or fidgety/restless 1 0 1 0 0  Suicidal thoughts 0 0 0 0 0  PHQ-9 Score 4 6 3 1  0  Difficult doing work/chores Not difficult at all Somewhat difficult Not difficult at all Not difficult at all  Not difficult at all     Advanced Directives Does patient have a HCPOA?    no If yes, name and contact information:  Does patient have a living will or MOST form?  no  Past Medical History:  Past Medical History:  Diagnosis Date   Allergy    DDD (degenerative disc disease), lumbar    Esophageal mass 2015   Found incidently on CXR- Saw ENT and for visualization and nothing was there.    GERD (gastroesophageal reflux disease)    Hyperlipidemia    Osteoporosis     Surgical History:  Past Surgical History:  Procedure Laterality Date   COLONOSCOPY WITH PROPOFOL N/A 02/06/2021   Procedure: COLONOSCOPY WITH PROPOFOL;  Surgeon: Pasty Spillers, MD;  Location: [facility] ENDOSCOPY;  Service: Endoscopy;  Laterality: N/A;   EYE SURGERY     cataract surgery    NOSE SURGERY     Cartilage removed from left side of nose    Medications:  Current Outpatient Medications on File Prior to Visit  Medication Sig   cholecalciferol (VITAMIN D3) 25 MCG (1000  UNIT) tablet Take 1,000 Units by mouth daily.   Glycerin-Polysorbate 80 (REFRESH DRY EYE THERAPY OP) Apply to eye.   triamcinolone (KENALOG) 0.025 % cream Apply topically.   No current facility-administered medications on file prior to visit.    Allergies:  Allergies  Allergen Reactions   Penicillins Rash    Social History:  Social History   Socioeconomic History   Marital status: Divorced    Spouse name: Not on file   Number of children: Not on file   Years of education: Not on file   Highest education level: Bachelor's degree (e.g., BA, AB, BS)  Occupational History   Not on file  Tobacco Use   Smoking status: Former    Current packs/day: 0.00    Types: Cigarettes    Quit date: 07/09/1971    Years since quitting: 51.8   Smokeless tobacco: Never  Vaping Use   Vaping status: Never Used  Substance and Sexual Activity   Alcohol use: Yes    Comment: wine   Drug use: No   Sexual activity: Never  Other Topics Concern   Not on file  Social History Narrative   Not on file   Social Determinants of Health   Financial Resource Strain: Low Risk  (05/17/2021)   Overall Financial Resource Strain (CARDIA)    Difficulty of Paying Living Expenses: Not hard at all  Food Insecurity: No Food Insecurity (05/17/2021)   Hunger Vital Sign    Worried About Running Out of Food in the Last Year: Never true    Ran Out of Food in the Last Year: Never true  Transportation Needs: No Transportation Needs (05/17/2021)   PRAPARE - Administrator, Civil Service (Medical): No    Lack of Transportation (Non-Medical): No  Physical Activity: Sufficiently Active (05/17/2021)   Exercise Vital Sign    Days of Exercise per Week: 4 days    Minutes of Exercise per Session: 40 min  Stress: No Stress Concern Present (05/17/2021)   [employer] of Occupational Health - Occupational Stress Questionnaire    Feeling of Stress : Not at all  Social Connections: Moderately Isolated  (05/17/2021)   Social Connection and Isolation Panel [NHANES]    Frequency of Communication with Friends and Family: More than three times a week    Frequency of Social Gatherings with Friends and Family: Three times a week  Attends Religious Services: 1 to 4 times per year    Active Member of Clubs or Organizations: No    Attends Banker Meetings: Never    Marital Status: Divorced  Catering manager Violence: Not At Risk (05/17/2021)   Humiliation, Afraid, Rape, and Kick questionnaire    Fear of Current or Ex-Partner: No    Emotionally Abused: No    Physically Abused: No    Sexually Abused: No   Social History   Tobacco Use  Smoking Status Former   Current packs/day: 0.00   Types: Cigarettes   Quit date: 07/09/1971   Years since quitting: 51.8  Smokeless Tobacco Never   Social History   Substance and Sexual Activity  Alcohol Use Yes   Comment: wine    Family History:  Family History  Problem Relation Age of Onset   Heart disease Mother    Diabetes Mother    Hiatal hernia Mother    Arthritis Mother    Cancer Father 22       Colon   Colitis Sister    Irritable bowel syndrome Sister    Arthritis Sister    Diabetes Brother    Stroke Maternal Grandmother    Pneumonia Maternal Grandmother    Kidney failure Paternal Grandmother    Leukemia Paternal Grandfather    Cancer Paternal Grandfather        Leukemia   Heart disease Sister    Irregular heart beat Sister    Thyroid disease Sister     Past medical history, surgical history, medications, allergies, family history and social history reviewed with patient today and changes made to appropriate areas of the chart.   Review of Systems  Constitutional: Negative.   HENT: Negative.    Eyes:  Positive for blurred vision. Negative for double vision, photophobia, pain, discharge and redness.  Respiratory: Negative.    Cardiovascular: Negative.   Gastrointestinal:  Positive for heartburn. Negative for  abdominal pain, blood in stool, constipation, diarrhea, melena, nausea and vomiting.  Genitourinary: Negative.   Musculoskeletal:  Positive for joint pain. Negative for back pain, falls, myalgias and neck pain.  Skin: Negative.   Neurological:  Positive for tingling. Negative for dizziness, tremors, sensory change, speech change, focal weakness, seizures, loss of consciousness, weakness and headaches.  Endo/Heme/Allergies: Negative.   Psychiatric/Behavioral:  Negative for depression, hallucinations, memory loss, substance abuse and suicidal ideas. The patient is nervous/anxious. The patient does not have insomnia.     All other ROS negative except what is listed above and in the HPI.      Objective:    BP (!) 153/77   Pulse 67   Ht 5' 3.5" (1.613 m)   Wt 162 lb 3.2 oz (73.6 kg)   SpO2 98%   BMI 28.28 kg/m   Wt Readings from Last 3 Encounters:  05/19/23 162 lb 3.2 oz (73.6 kg)  01/27/23 159 lb 12.8 oz (72.5 kg)  01/11/23 155 lb (70.3 kg)     Physical Exam Vitals and nursing note reviewed.  Constitutional:      General: She is not in acute distress.    Appearance: Normal appearance. She is not ill-appearing, toxic-appearing or diaphoretic.  HENT:     Head: Normocephalic and atraumatic.     Right Ear: Tympanic membrane, ear canal and external ear normal. There is no impacted cerumen.     Left Ear: Tympanic membrane, ear canal and external ear normal. There is no impacted cerumen.     Nose: Nose  normal. No congestion or rhinorrhea.     Mouth/Throat:     Mouth: Mucous membranes are moist.     Pharynx: Oropharynx is clear. No oropharyngeal exudate or posterior oropharyngeal erythema.  Eyes:     General: No scleral icterus.       Right eye: No discharge.        Left eye: No discharge.     Extraocular Movements: Extraocular movements intact.     Conjunctiva/sclera: Conjunctivae normal.     Pupils: Pupils are equal, round, and reactive to light.  Neck:     Vascular: No carotid  bruit.  Cardiovascular:     Rate and Rhythm: Normal rate and regular rhythm.     Pulses: Normal pulses.     Heart sounds: No murmur heard.    No friction rub. No gallop.  Pulmonary:     Effort: Pulmonary effort is normal. No respiratory distress.     Breath sounds: Normal breath sounds. No stridor. No wheezing, rhonchi or rales.  Chest:     Chest wall: No tenderness.  Abdominal:     General: Abdomen is flat. Bowel sounds are normal. There is no distension.     Palpations: Abdomen is soft. There is no mass.     Tenderness: There is no abdominal tenderness. There is no right CVA tenderness, left CVA tenderness, guarding or rebound.     Hernia: No hernia is present.  Genitourinary:    Comments: Breast and pelvic exams deferred with shared decision making Musculoskeletal:        General: No swelling, tenderness, deformity or signs of injury.     Cervical back: Normal range of motion and neck supple. No rigidity. No muscular tenderness.     Right lower leg: No edema.     Left lower leg: No edema.  Lymphadenopathy:     Cervical: No cervical adenopathy.  Skin:    General: Skin is warm and dry.     Capillary Refill: Capillary refill takes less than 2 seconds.     Coloration: Skin is not jaundiced or pale.     Findings: No bruising, erythema, lesion or rash.  Neurological:     General: No focal deficit present.     Mental Status: She is alert and oriented to person, place, and time. Mental status is at baseline.     Cranial Nerves: No cranial nerve deficit.     Sensory: No sensory deficit.     Motor: No weakness.     Coordination: Coordination normal.     Gait: Gait normal.     Deep Tendon Reflexes: Reflexes normal.  Psychiatric:        Mood and Affect: Mood normal.        Behavior: Behavior normal.        Thought Content: Thought content normal.        Judgment: Judgment normal.        05/19/2023    8:42 AM 05/17/2022   12:11 PM 12/19/2017   10:43 AM 12/17/2016   10:47 AM   6CIT Screen  What Year? 0 points 0 points 0 points 0 points  What month? 0 points 0 points 0 points 0 points  What time? 0 points 0 points 0 points 0 points  Count back from 20 0 points 0 points 0 points 2 points  Months in reverse 0 points 0 points 0 points 0 points  Repeat phrase 0 points 0 points 0 points 0 points  Total Score 0 points  0 points 0 points 2 points     Results for orders placed or performed in visit on 11/07/22  Comprehensive metabolic panel  Result Value Ref Range   Glucose 84 70 - 99 mg/dL   BUN 16 8 - 27 mg/dL   Creatinine, Ser 6.96 0.57 - 1.00 mg/dL   eGFR 74 >29 BM/WUX/3.24   BUN/Creatinine Ratio 19 12 - 28   Sodium 141 134 - 144 mmol/L   Potassium 4.2 3.5 - 5.2 mmol/L   Chloride 102 96 - 106 mmol/L   CO2 22 20 - 29 mmol/L   Calcium 9.5 8.7 - 10.3 mg/dL   Total Protein 6.7 6.0 - 8.5 g/dL   Albumin 4.6 3.8 - 4.8 g/dL   Globulin, Total 2.1 1.5 - 4.5 g/dL   Albumin/Globulin Ratio 2.2 1.2 - 2.2   Bilirubin Total 0.5 0.0 - 1.2 mg/dL   Alkaline Phosphatase 58 44 - 121 IU/L   AST 23 0 - 40 IU/L   ALT 14 0 - 32 IU/L  CBC with Differential/Platelet  Result Value Ref Range   WBC 4.2 3.4 - 10.8 x10E3/uL   RBC 4.68 3.77 - 5.28 x10E6/uL   Hemoglobin 14.3 11.1 - 15.9 g/dL   Hematocrit 40.1 02.7 - 46.6 %   MCV 92 79 - 97 fL   MCH 30.6 26.6 - 33.0 pg   MCHC 33.1 31.5 - 35.7 g/dL   RDW 25.3 66.4 - 40.3 %   Platelets 207 150 - 450 x10E3/uL   Neutrophils 49 Not Estab. %   Lymphs 39 Not Estab. %   Monocytes 9 Not Estab. %   Eos 2 Not Estab. %   Basos 1 Not Estab. %   Neutrophils Absolute 2.0 1.4 - 7.0 x10E3/uL   Lymphocytes Absolute 1.6 0.7 - 3.1 x10E3/uL   Monocytes Absolute 0.4 0.1 - 0.9 x10E3/uL   EOS (ABSOLUTE) 0.1 0.0 - 0.4 x10E3/uL   Basophils Absolute 0.1 0.0 - 0.2 x10E3/uL   Immature Granulocytes 0 Not Estab. %   Immature Grans (Abs) 0.0 0.0 - 0.1 x10E3/uL  Lipid Panel w/o Chol/HDL Ratio  Result Value Ref Range   Cholesterol, Total 267 (H) 100 -  199 mg/dL   Triglycerides 55 0 - 149 mg/dL   HDL 91 >47 mg/dL   VLDL Cholesterol Cal 8 5 - 40 mg/dL   LDL Chol Calc (NIH) 425 (H) 0 - 99 mg/dL  VITAMIN D 25 Hydroxy (Vit-D Deficiency, Fractures)  Result Value Ref Range   Vit D, 25-Hydroxy 25.9 (L) 30.0 - 100.0 ng/mL      Assessment & Plan:   Problem List Items Addressed This Visit       Other   Hyperlipidemia, unspecified    Rechecking labs today. Await results. Treat as needed.       Relevant Orders   Comprehensive metabolic panel   Lipid Panel w/o Chol/HDL Ratio   Anxiety    Stable off medicine. Continue to monitor. Call with any concerns.       Relevant Orders   CBC with Differential/Platelet   TSH   Vitamin D deficiency    Rechecking labs today. Await results. Treat as needed.       Relevant Orders   VITAMIN D 25 Hydroxy (Vit-D Deficiency, Fractures)   Other Visit Diagnoses     Encounter for Medicare annual wellness exam    -  Primary   Preventative care discussed today as below.   Routine general medical examination at a health care facility  Vaccines up to date. Screening labs checked today. Mammo, DEXA and colonoscopy up to date. Continue diet and exercise. Call with any concerns.   Elevated blood pressure reading without diagnosis of hypertension       Will work on [employer] and increase exercise. Call with any concerns.   Relevant Orders   Microalbumin, Urine Waived   Encounter for screening mammogram for malignant neoplasm of breast       Mammo ordered.   Relevant Orders   MM 3D SCREENING MAMMOGRAM BILATERAL BREAST   Need for COVID-19 vaccine       Covid vaccines given today.   Relevant Orders   Civil engineer, contracting Covid -19 Vaccine 49yrs and older (Completed)        Preventative Services:  Health Risk Assessment and Personalized Prevention Plan: Done today Bone Mass Measurements: Up to date Breast Cancer Screening: Up to date CVD Screening: Done today Cervical Cancer Screening: N/A Colon  Cancer Screening: Up to date Depression Screening: Done today Diabetes Screening: Done today Glaucoma Screening: See your eye doctor Hepatitis B vaccine: N/A Hepatitis C screening: Up to date HIV Screening:Up to date Flu Vaccine: Up to date Lung cancer Screening: N/A Obesity Screening: Up to date Pneumonia Vaccines (2): Up to date STI Screening: N/A  Follow up plan: Return in about 6 months (around 11/16/2023).   LABORATORY TESTING:  - Pap smear: not applicable  IMMUNIZATIONS:   - Tdap: Tetanus vaccination status reviewed: last tetanus booster within 10 years. - Influenza: Up to date - Pneumovax: Up to date - Prevnar: Up to date - Zostavax vaccine: Refused  SCREENING: -Mammogram: Ordered today  - Colonoscopy: Up to date  - Bone Density: Up to date   PATIENT COUNSELING:   Advised to take 1 mg of folate supplement per day if capable of pregnancy.   Sexuality: Discussed sexually transmitted diseases, partner selection, use of condoms, avoidance of unintended pregnancy  and contraceptive alternatives.   Advised to avoid cigarette smoking.  I discussed with the patient that most people either abstain from alcohol or drink within safe limits (<=14/week and <=4 drinks/occasion for males, <=7/weeks and <= 3 drinks/occasion for females) and that the risk for alcohol disorders and other health effects rises proportionally with the number of drinks per week and how often a drinker exceeds daily limits.  Discussed cessation/primary prevention of drug use and availability of treatment for abuse.   Diet: Encouraged to adjust caloric intake to maintain  or achieve ideal body weight, to reduce intake of dietary saturated fat and total fat, to limit sodium intake by avoiding high sodium foods and not adding table salt, and to maintain adequate dietary potassium and calcium preferably from fresh fruits, vegetables, and low-fat dairy products.    stressed the importance of regular  exercise  Injury prevention: Discussed safety belts, safety helmets, smoke detector, smoking near bedding or upholstery.   Dental health: Discussed importance of regular tooth brushing, flossing, and dental visits.    NEXT PREVENTATIVE PHYSICAL DUE IN 1 YEAR. Return in about 6 months (around 11/16/2023).

## 2023-05-19 NOTE — Patient Instructions (Signed)
Preventative Services:  Health Risk Assessment and Personalized Prevention Plan: Done today Bone Mass Measurements: Up to date Breast Cancer Screening: Up to date CVD Screening: Done today Cervical Cancer Screening: N/A Colon Cancer Screening: Up to date Depression Screening: Done today Diabetes Screening: Done today Glaucoma Screening: See your eye doctor Hepatitis B vaccine: N/A Hepatitis C screening: Up to date HIV Screening:Up to date Flu Vaccine: Up to date Lung cancer Screening: N/A Obesity Screening: Up to date Pneumonia Vaccines (2): Up to date STI Screening: N/A

## 2023-05-20 DIAGNOSIS — H43812 Vitreous degeneration, left eye: Secondary | ICD-10-CM | POA: Diagnosis not present

## 2023-05-20 LAB — CBC WITH DIFFERENTIAL/PLATELET
Basophils Absolute: 0.1 10*3/uL (ref 0.0–0.2)
Basos: 1 %
EOS (ABSOLUTE): 0.1 10*3/uL (ref 0.0–0.4)
Eos: 2 %
Hematocrit: 43.7 % (ref 34.0–46.6)
Hemoglobin: 14 g/dL (ref 11.1–15.9)
Immature Grans (Abs): 0 10*3/uL (ref 0.0–0.1)
Immature Granulocytes: 0 %
Lymphocytes Absolute: 1.6 10*3/uL (ref 0.7–3.1)
Lymphs: 35 %
MCH: 30.2 pg (ref 26.6–33.0)
MCHC: 32 g/dL (ref 31.5–35.7)
MCV: 94 fL (ref 79–97)
Monocytes Absolute: 0.4 10*3/uL (ref 0.1–0.9)
Monocytes: 8 %
Neutrophils Absolute: 2.5 10*3/uL (ref 1.4–7.0)
Neutrophils: 54 %
Platelets: 221 10*3/uL (ref 150–450)
RBC: 4.63 x10E6/uL (ref 3.77–5.28)
RDW: 11.4 % — ABNORMAL LOW (ref 11.7–15.4)
WBC: 4.6 10*3/uL (ref 3.4–10.8)

## 2023-05-20 LAB — COMPREHENSIVE METABOLIC PANEL
ALT: 13 [IU]/L (ref 0–32)
AST: 21 [IU]/L (ref 0–40)
Albumin: 4.3 g/dL (ref 3.8–4.8)
Alkaline Phosphatase: 59 [IU]/L (ref 44–121)
BUN/Creatinine Ratio: 10 — ABNORMAL LOW (ref 12–28)
BUN: 8 mg/dL (ref 8–27)
Bilirubin Total: 0.3 mg/dL (ref 0.0–1.2)
CO2: 23 mmol/L (ref 20–29)
Calcium: 9.1 mg/dL (ref 8.7–10.3)
Chloride: 102 mmol/L (ref 96–106)
Creatinine, Ser: 0.82 mg/dL (ref 0.57–1.00)
Globulin, Total: 2.4 g/dL (ref 1.5–4.5)
Glucose: 86 mg/dL (ref 70–99)
Potassium: 4.1 mmol/L (ref 3.5–5.2)
Sodium: 139 mmol/L (ref 134–144)
Total Protein: 6.7 g/dL (ref 6.0–8.5)
eGFR: 75 mL/min/{1.73_m2} (ref >59)

## 2023-05-20 LAB — LIPID PANEL W/O CHOL/HDL RATIO
Cholesterol, Total: 269 mg/dL — ABNORMAL HIGH (ref 100–199)
HDL: 90 mg/dL (ref >39)
LDL Chol Calc (NIH): 168 mg/dL — ABNORMAL HIGH (ref 0–99)
Triglycerides: 67 mg/dL (ref 0–149)
VLDL Cholesterol Cal: 11 mg/dL (ref 5–40)

## 2023-05-20 LAB — TSH: TSH: 2.61 u[IU]/mL (ref 0.450–4.500)

## 2023-05-20 LAB — VITAMIN D 25 HYDROXY (VIT D DEFICIENCY, FRACTURES): Vit D, 25-Hydroxy: 26.2 ng/mL — ABNORMAL LOW (ref 30.0–100.0)

## 2023-05-28 DIAGNOSIS — H26493 Other secondary cataract, bilateral: Secondary | ICD-10-CM | POA: Diagnosis not present

## 2023-05-28 DIAGNOSIS — H43813 Vitreous degeneration, bilateral: Secondary | ICD-10-CM | POA: Diagnosis not present

## 2023-10-18 LAB — LIPID PANEL
Cholesterol: 249
HDL: 76
LDL: 149
Triglycerides: 121 (ref 40–160)

## 2023-10-18 LAB — POCT ABI - SCREENING FOR PILOT NO CHARGE
Left ABI: 1.1
Right ABI: 1.1

## 2023-10-18 LAB — AMB RESULTS CONSOLE CBG: Glucose: 75

## 2023-10-18 NOTE — Progress Notes (Unsigned)
 Pt. Attended the Community event on 10/18/2023/. Pt BP is 178/82, Blood Glucose 75 non-fasting ,Pt did not have SDOH insecurities. Pt instructed to contact pcp about their cholesterol.

## 2023-10-23 ENCOUNTER — Ambulatory Visit
Admission: RE | Admit: 2023-10-23 | Discharge: 2023-10-23 | Disposition: A | Discharge status: Home / Self Care | Source: Ambulatory Visit | Attending: Family Medicine | Admitting: Family Medicine

## 2023-10-23 DIAGNOSIS — Z1231 Encounter for screening mammogram for malignant neoplasm of breast: Secondary | ICD-10-CM

## 2023-10-28 ENCOUNTER — Encounter: Payer: Self-pay | Admitting: Family Medicine

## 2023-11-17 ENCOUNTER — Ambulatory Visit: Payer: Self-pay | Admitting: Family Medicine

## 2023-11-17 ENCOUNTER — Encounter: Payer: Self-pay | Admitting: Family Medicine

## 2023-11-17 VITALS — BP 156/80 | HR 70 | Temp 97.8°F | Wt 156.2 lb

## 2023-11-17 DIAGNOSIS — W57XXXA Bitten or stung by nonvenomous insect and other nonvenomous arthropods, initial encounter: Secondary | ICD-10-CM | POA: Diagnosis not present

## 2023-11-17 DIAGNOSIS — I10 Essential (primary) hypertension: Secondary | ICD-10-CM

## 2023-11-17 DIAGNOSIS — E782 Mixed hyperlipidemia: Secondary | ICD-10-CM

## 2023-11-17 DIAGNOSIS — E559 Vitamin D deficiency, unspecified: Secondary | ICD-10-CM | POA: Diagnosis not present

## 2023-11-17 DIAGNOSIS — H93A2 Pulsatile tinnitus, left ear: Secondary | ICD-10-CM

## 2023-11-17 LAB — MICROALBUMIN, URINE WAIVED
Creatinine, Urine Waived: 100 mg/dL (ref 10–300)
Microalb, Ur Waived: 150 mg/L — ABNORMAL HIGH (ref 0–19)

## 2023-11-17 MED ORDER — MUPIROCIN 2 % EX OINT: 1.0000 | TOPICAL_OINTMENT | Freq: Two times a day (BID) | CUTANEOUS | 0 refills | Status: AC

## 2023-11-17 MED ORDER — LISINOPRIL 5 MG PO TABS: 5.0000 mg | ORAL_TABLET | Freq: Every day | ORAL | 3 refills | Status: DC

## 2023-11-17 NOTE — Assessment & Plan Note (Signed)
 Will start her on lisinopril and recheck in about a month. Call with any concerns.

## 2023-11-17 NOTE — Assessment & Plan Note (Signed)
 Rechecking labs today. Await results. Treat as needed.

## 2023-11-17 NOTE — Progress Notes (Signed)
 BP (!) 156/80   Pulse 70   Temp 97.8 F (36.6 C) (Oral)   Wt 156 lb 3.2 oz (70.9 kg)   SpO2 99%   BMI 27.24 kg/m    Subjective:    Patient ID: Angie Copeland, female    DOB: [birth year], 73 y.o.   MRN: [id number]  HPI: Angie Copeland is a 73 y.o. female  Chief Complaint  Patient presents with   Hypertension   Possible Tick BIte   Scratched off a tick yesterday. Is not sure how long it was on for.   HYPERTENSION / HYPERLIPIDEMIA Satisfied with current treatment? yes Duration of hypertension: chronic BP monitoring frequency: not checking BP medication side effects: no Past BP meds: none Duration of hyperlipidemia: chronic Cholesterol medication side effects: no Cholesterol supplements: none Past cholesterol medications: none Medication compliance: N/A Aspirin: no Recent stressors: no Recurrent headaches: no Visual changes: no Palpitations: no Dyspnea: no Chest pain: no Lower extremity edema: no Dizzy/lightheaded: no   Relevant past medical, surgical, family and social history reviewed and updated as indicated. Interim medical history since our last visit reviewed. Allergies and medications reviewed and updated.  Review of Systems  Constitutional: Negative.   Respiratory: Negative.    Cardiovascular: Negative.   Musculoskeletal: Negative.   Neurological: Negative.   Psychiatric/Behavioral: Negative.      Per HPI unless specifically indicated above     Objective:     BP (!) 156/80   Pulse 70   Temp 97.8 F (36.6 C) (Oral)   Wt 156 lb 3.2 oz (70.9 kg)   SpO2 99%   BMI 27.24 kg/m   Wt Readings from Last 3 Encounters:  11/17/23 156 lb 3.2 oz (70.9 kg)  05/19/23 162 lb 3.2 oz (73.6 kg)  01/27/23 159 lb 12.8 oz (72.5 kg)    Physical Exam Vitals and nursing note reviewed.  Constitutional:      General: She is not in acute distress.    Appearance: Normal appearance. She is not ill-appearing, toxic-appearing or diaphoretic.  HENT:     Head:  Normocephalic and atraumatic.     Right Ear: External ear normal.     Left Ear: External ear normal.     Nose: Nose normal.     Mouth/Throat:     Mouth: Mucous membranes are moist.     Pharynx: Oropharynx is clear.  Eyes:     General: No scleral icterus.       Right eye: No discharge.        Left eye: No discharge.     Extraocular Movements: Extraocular movements intact.     Conjunctiva/sclera: Conjunctivae normal.     Pupils: Pupils are equal, round, and reactive to light.  Cardiovascular:     Rate and Rhythm: Normal rate and regular rhythm.     Pulses: Normal pulses.     Heart sounds: Normal heart sounds. No murmur heard.    No friction rub. No gallop.  Pulmonary:     Effort: Pulmonary effort is normal. No respiratory distress.     Breath sounds: Normal breath sounds. No stridor. No wheezing, rhonchi or rales.  Chest:     Chest wall: No tenderness.  Musculoskeletal:        General: Normal range of motion.     Cervical back: Normal range of motion and neck supple.  Skin:    General: Skin is warm and dry.     Capillary Refill: Capillary refill takes less than 2 seconds.  Coloration: Skin is not jaundiced or pale.     Findings: No bruising, erythema, lesion or rash.  Neurological:     General: No focal deficit present.     Mental Status: She is alert and oriented to person, place, and time. Mental status is at baseline.  Psychiatric:        Mood and Affect: Mood normal.        Behavior: Behavior normal.        Thought Content: Thought content normal.        Judgment: Judgment normal.     Results for orders placed or performed in visit on 10/18/23  CBG   Collection Time: 10/18/23 12:00 AM  Result Value Ref Range   Glucose 75   Lipid panel   Collection Time: 10/18/23 12:00 AM  Result Value Ref Range   Triglycerides 121 40 - 160   Cholesterol 249    HDL 76    LDL 149   POCT ABI Screening for Pilot No Charge   Collection Time: 10/18/23  1:26 PM  Result Value Ref  Range   Left ABI 1.1    Right ABI 1.1       Assessment & Plan:   Problem List Items Addressed This Visit       Cardiovascular and Mediastinum   Primary hypertension   Will start her on lisinopril and recheck in about a month. Call with any concerns.       Relevant Medications   lisinopril (ZESTRIL) 5 MG tablet   Other Relevant Orders   Microalbumin, Urine Waived     Other   Hyperlipidemia, unspecified - Primary   Rechecking labs today. Await results. Treat as needed.       Relevant Medications   lisinopril (ZESTRIL) 5 MG tablet   Other Relevant Orders   CBC with Differential/Platelet   Comprehensive metabolic panel with GFR   Lipid Panel w/o Chol/HDL Ratio   Vitamin D  deficiency   Rechecking labs today. Await results. Treat as needed.       Relevant Orders   CBC with Differential/Platelet   VITAMIN D  25 Hydroxy (Vit-D Deficiency, Fractures)   Other Visit Diagnoses       Tick bite, unspecified site, initial encounter       Will check labs and treat with bactroban. Call with any concerns.   Relevant Orders   CBC with Differential/Platelet   Lyme Disease Serology w/Reflex   Spotted Fever Group Antibodies     Pulsatile tinnitus of left ear       No bruits on exam. Will check carotid US . Await results.   Relevant Orders   US  Carotid Duplex Bilateral        Follow up plan: Return in about 4 weeks (around 12/15/2023).

## 2023-11-18 ENCOUNTER — Ambulatory Visit
Admission: RE | Admit: 2023-11-18 | Discharge: 2023-11-18 | Disposition: A | Discharge status: Home / Self Care | Source: Ambulatory Visit | Attending: Family Medicine | Admitting: Family Medicine

## 2023-11-18 DIAGNOSIS — H93A2 Pulsatile tinnitus, left ear: Secondary | ICD-10-CM | POA: Insufficient documentation

## 2023-11-20 LAB — CBC WITH DIFFERENTIAL/PLATELET
Basophils Absolute: 0.1 10*3/uL (ref 0.0–0.2)
Basos: 1 %
EOS (ABSOLUTE): 0.1 10*3/uL (ref 0.0–0.4)
Eos: 1 %
Hematocrit: 46.5 % (ref 34.0–46.6)
Hemoglobin: 15.2 g/dL (ref 11.1–15.9)
Immature Grans (Abs): 0 10*3/uL (ref 0.0–0.1)
Immature Granulocytes: 0 %
Lymphocytes Absolute: 1.7 10*3/uL (ref 0.7–3.1)
Lymphs: 44 %
MCH: 31 pg (ref 26.6–33.0)
MCHC: 32.7 g/dL (ref 31.5–35.7)
MCV: 95 fL (ref 79–97)
Monocytes Absolute: 0.3 10*3/uL (ref 0.1–0.9)
Monocytes: 9 %
Neutrophils Absolute: 1.7 10*3/uL (ref 1.4–7.0)
Neutrophils: 45 %
Platelets: 229 10*3/uL (ref 150–450)
RBC: 4.91 x10E6/uL (ref 3.77–5.28)
RDW: 11.7 % (ref 11.7–15.4)
WBC: 3.8 10*3/uL (ref 3.4–10.8)

## 2023-11-20 LAB — COMPREHENSIVE METABOLIC PANEL WITH GFR
ALT: 18 IU/L (ref 0–32)
AST: 25 IU/L (ref 0–40)
Albumin: 4.7 g/dL (ref 3.8–4.8)
Alkaline Phosphatase: 64 IU/L (ref 44–121)
BUN/Creatinine Ratio: 15 (ref 12–28)
BUN: 13 mg/dL (ref 8–27)
Bilirubin Total: 0.6 mg/dL (ref 0.0–1.2)
CO2: 23 mmol/L (ref 20–29)
Calcium: 9.8 mg/dL (ref 8.7–10.3)
Chloride: 101 mmol/L (ref 96–106)
Creatinine, Ser: 0.86 mg/dL (ref 0.57–1.00)
Globulin, Total: 2.5 g/dL (ref 1.5–4.5)
Glucose: 81 mg/dL (ref 70–99)
Potassium: 4.1 mmol/L (ref 3.5–5.2)
Sodium: 140 mmol/L (ref 134–144)
Total Protein: 7.2 g/dL (ref 6.0–8.5)
eGFR: 71 mL/min/{1.73_m2} (ref >59)

## 2023-11-20 LAB — SPOTTED FEVER GROUP ANTIBODIES

## 2023-11-20 LAB — LIPID PANEL W/O CHOL/HDL RATIO
Cholesterol, Total: 288 mg/dL — ABNORMAL HIGH (ref 100–199)
HDL: 98 mg/dL (ref >39)
LDL Chol Calc (NIH): 180 mg/dL — ABNORMAL HIGH (ref 0–99)
Triglycerides: 67 mg/dL (ref 0–149)
VLDL Cholesterol Cal: 10 mg/dL (ref 5–40)

## 2023-11-20 LAB — LYME DISEASE SEROLOGY W/REFLEX: Lyme Total Antibody EIA: NEGATIVE

## 2023-11-20 LAB — VITAMIN D 25 HYDROXY (VIT D DEFICIENCY, FRACTURES): Vit D, 25-Hydroxy: 33.1 ng/mL (ref 30.0–100.0)

## 2023-11-21 ENCOUNTER — Ambulatory Visit: Payer: Self-pay | Admitting: Family Medicine

## 2023-11-21 ENCOUNTER — Ambulatory Visit: Payer: Self-pay

## 2023-11-21 NOTE — Telephone Encounter (Signed)
 Chief Complaint: medication side effects Disposition: [] ED /[] Urgent Care (no appt availability in office) / [] Appointment(In office/virtual)/ []  [facility]/ [] Home Care/ [] Refused Recommended Disposition /[] [facility]/ [x]  Follow-up with PCP Additional Notes: pt states that she does have rosacea but it has been years since she had a full butterfly blush. States this morning she has a full butterfly blush across her face. States that even the top of her ears is red. States that she recently started on 5mg  of lisinopril ans last night was her last pill.  States she also woke up with swooshing in her ears.   Copied from CRM [id number]. Topic: Clinical - Medical Advice >> Nov 21, 2023  8:10 AM Opal Bill wrote: Reason for CRM: Pt just started her bp med, lisinopril (ZESTRIL) 5 MG tablet and now she has a butterfly flush all over face, hair feels tingly, and a slight headache. Pt mentioned she rosacea but hasnt had an outbreak in a very long time. Pt asking if she should still take the medication? She knows she needs it but not sure if this is a reaction from it. Reason for Disposition  [1] Caller has URGENT medicine question about med that PCP or specialist prescribed AND [2] triager unable to answer question  Answer Assessment - Initial Assessment Questions 1. NAME of MEDICINE: "What medicine(s) are you calling about?"     lisinopril 2. QUESTION: "What is your question?" (e.g., double dose of medicine, side effect)     Side effects 3. PRESCRIBER: "Who prescribed the medicine?" Reason: if prescribed by specialist, call should be referred to that group.     johnson 4. SYMPTOMS: "Do you have any symptoms?" If Yes, ask: "What symptoms are you having?"  "How bad are the symptoms (e.g., mild, moderate, severe)     Mod- rash  Protocols used: Medication Question Call-A-AH

## 2023-11-24 MED ORDER — ATORVASTATIN CALCIUM 40 MG PO TABS: 40.0000 mg | ORAL_TABLET | Freq: Every day | ORAL | 0 refills | Status: DC

## 2023-11-24 NOTE — Telephone Encounter (Signed)
 Copied from CRM [id number]. Topic: Clinical - Medication Question >> Nov 24, 2023  9:49 AM Star East wrote: Reason for CRM: Patient responding to Dr Anselmo Bast message on mychart, is fine with starting cholesterol medication, rosacea has started showing up on her face since starting new blood pressure medication- please call [phone]

## 2023-11-24 NOTE — Telephone Encounter (Signed)
-----   Message from Angie Copeland, [place] sent at 11/24/2023 12:56 PM EDT ----- Patient ok to start statin

## 2023-11-24 NOTE — Telephone Encounter (Signed)
 Appt scheduled

## 2023-12-09 ENCOUNTER — Ambulatory Visit: Admitting: Family Medicine

## 2023-12-09 ENCOUNTER — Encounter: Payer: Self-pay | Admitting: Family Medicine

## 2023-12-09 VITALS — BP 128/62 | HR 69 | Temp 97.9°F | Resp 15 | Ht 63.5 in | Wt 155.0 lb

## 2023-12-09 DIAGNOSIS — E782 Mixed hyperlipidemia: Secondary | ICD-10-CM | POA: Diagnosis not present

## 2023-12-09 DIAGNOSIS — H00011 Hordeolum externum right upper eyelid: Secondary | ICD-10-CM

## 2023-12-09 DIAGNOSIS — I10 Essential (primary) hypertension: Secondary | ICD-10-CM | POA: Diagnosis not present

## 2023-12-09 MED ORDER — LISINOPRIL 5 MG PO TABS: 5.0000 mg | ORAL_TABLET | Freq: Every day | ORAL | 1 refills | Status: DC

## 2023-12-09 MED ORDER — ATORVASTATIN CALCIUM 40 MG PO TABS: 40.0000 mg | ORAL_TABLET | Freq: Every day | ORAL | 1 refills | Status: DC

## 2023-12-09 MED ORDER — ERYTHROMYCIN 5 MG/GM OP OINT: 1.0000 | TOPICAL_OINTMENT | Freq: Every day | OPHTHALMIC | 0 refills | Status: DC

## 2023-12-09 NOTE — Assessment & Plan Note (Signed)
 Under good control on current regimen. Continue current regimen. Continue to monitor. Call with any concerns. Refills given. Labs drawn today.

## 2023-12-09 NOTE — Progress Notes (Signed)
 BP 128/62   Pulse 69   Temp 97.9 F (36.6 C) (Oral)   Resp 15   Ht 5' 3.5" (1.613 m)   Wt 155 lb (70.3 kg)   SpO2 97%   BMI 27.02 kg/m    Subjective:    Patient ID: Angie Copeland, female    DOB: 30-Aug-1950, 73 y.o.   MRN: [id number]  HPI: Angie Copeland is a 73 y.o. female  Chief Complaint  Patient presents with   Hypertension    Since starting the medication has noticed some flushing, runny nose and am coughing that resolves as she gets up gets up.    Stye    Right eye ongoing for 10 days. Has not been able to get it to drain.    HYPERTENSION / HYPERLIPIDEMIA Satisfied with current treatment? yes Duration of hypertension: chronic BP monitoring frequency: not checking BP medication side effects: no Past BP meds: lisinopril  Duration of hyperlipidemia: chronic Cholesterol medication side effects: no Cholesterol supplements: none Past cholesterol medications: atorvastatin  Medication compliance: excellent compliance Aspirin: no Recent stressors: no Recurrent headaches: no Visual changes: no Palpitations: no Dyspnea: no Chest pain: no Lower extremity edema: no Dizzy/lightheaded: no  Relevant past medical, surgical, family and social history reviewed and updated as indicated. Interim medical history since our last visit reviewed. Allergies and medications reviewed and updated.  Review of Systems  Constitutional: Negative.   Eyes:        + stye  Respiratory: Negative.    Cardiovascular: Negative.   Musculoskeletal: Negative.   Neurological: Negative.   Psychiatric/Behavioral: Negative.      Per HPI unless specifically indicated above     Objective:     BP 128/62   Pulse 69   Temp 97.9 F (36.6 C) (Oral)   Resp 15   Ht 5' 3.5" (1.613 m)   Wt 155 lb (70.3 kg)   SpO2 97%   BMI 27.02 kg/m   Wt Readings from Last 3 Encounters:  12/09/23 155 lb (70.3 kg)  11/17/23 156 lb 3.2 oz (70.9 kg)  05/19/23 162 lb 3.2 oz (73.6 kg)    Physical Exam Vitals and  nursing note reviewed.  Constitutional:      General: She is not in acute distress.    Appearance: Normal appearance. She is not ill-appearing, toxic-appearing or diaphoretic.  HENT:     Head: Normocephalic and atraumatic.     Right Ear: External ear normal.     Left Ear: External ear normal.     Nose: Nose normal.     Mouth/Throat:     Mouth: Mucous membranes are moist.     Pharynx: Oropharynx is clear.  Eyes:     General: No scleral icterus.       Right eye: No discharge.        Left eye: No discharge.     Extraocular Movements: Extraocular movements intact.     Conjunctiva/sclera: Conjunctivae normal.     Pupils: Pupils are equal, round, and reactive to light.   Cardiovascular:     Rate and Rhythm: Normal rate and regular rhythm.     Pulses: Normal pulses.     Heart sounds: Normal heart sounds. No murmur heard.    No friction rub. No gallop.  Pulmonary:     Effort: Pulmonary effort is normal. No respiratory distress.     Breath sounds: Normal breath sounds. No stridor. No wheezing, rhonchi or rales.  Chest:     Chest wall: No tenderness.  Musculoskeletal:        General: Normal range of motion.     Cervical back: Normal range of motion and neck supple.  Skin:    General: Skin is warm and dry.     Capillary Refill: Capillary refill takes less than 2 seconds.     Coloration: Skin is not jaundiced or pale.     Findings: No bruising, erythema, lesion or rash.  Neurological:     General: No focal deficit present.     Mental Status: She is alert and oriented to person, place, and time. Mental status is at baseline.  Psychiatric:        Mood and Affect: Mood normal.        Behavior: Behavior normal.        Thought Content: Thought content normal.        Judgment: Judgment normal.     Results for orders placed or performed in visit on 11/17/23  Microalbumin, Urine Waived   Collection Time: 11/17/23  8:27 AM  Result Value Ref Range   Microalb, Ur Waived 150 (H) 0 - 19  mg/L   Creatinine, Urine Waived 100 10 - 300 mg/dL   Microalb/Creat Ratio 30-300 (H) <30 mg/g  CBC with Differential/Platelet   Collection Time: 11/17/23  8:29 AM  Result Value Ref Range   WBC 3.8 3.4 - 10.8 x10E3/uL   RBC 4.91 3.77 - 5.28 x10E6/uL   Hemoglobin 15.2 11.1 - 15.9 g/dL   Hematocrit 63.8 75.6 - 46.6 %   MCV 95 79 - 97 fL   MCH 31.0 26.6 - 33.0 pg   MCHC 32.7 31.5 - 35.7 g/dL   RDW 43.3 29.5 - 18.8 %   Platelets 229 150 - 450 x10E3/uL   Neutrophils 45 Not Estab. %   Lymphs 44 Not Estab. %   Monocytes 9 Not Estab. %   Eos 1 Not Estab. %   Basos 1 Not Estab. %   Neutrophils Absolute 1.7 1.4 - 7.0 x10E3/uL   Lymphocytes Absolute 1.7 0.7 - 3.1 x10E3/uL   Monocytes Absolute 0.3 0.1 - 0.9 x10E3/uL   EOS (ABSOLUTE) 0.1 0.0 - 0.4 x10E3/uL   Basophils Absolute 0.1 0.0 - 0.2 x10E3/uL   Immature Granulocytes 0 Not Estab. %   Immature Grans (Abs) 0.0 0.0 - 0.1 x10E3/uL  Comprehensive metabolic panel with GFR   Collection Time: 11/17/23  8:29 AM  Result Value Ref Range   Glucose 81 70 - 99 mg/dL   BUN 13 8 - 27 mg/dL   Creatinine, Ser 4.16 0.57 - 1.00 mg/dL   eGFR 71 >60 YT/KZS/0.10   BUN/Creatinine Ratio 15 12 - 28   Sodium 140 134 - 144 mmol/L   Potassium 4.1 3.5 - 5.2 mmol/L   Chloride 101 96 - 106 mmol/L   CO2 23 20 - 29 mmol/L   Calcium  9.8 8.7 - 10.3 mg/dL   Total Protein 7.2 6.0 - 8.5 g/dL   Albumin 4.7 3.8 - 4.8 g/dL   Globulin, Total 2.5 1.5 - 4.5 g/dL   Bilirubin Total 0.6 0.0 - 1.2 mg/dL   Alkaline Phosphatase 64 44 - 121 IU/L   AST 25 0 - 40 IU/L   ALT 18 0 - 32 IU/L  Lipid Panel w/o Chol/HDL Ratio   Collection Time: 11/17/23  8:29 AM  Result Value Ref Range   Cholesterol, Total 288 (H) 100 - 199 mg/dL   Triglycerides 67 0 - 149 mg/dL   HDL 98 >93 mg/dL  VLDL Cholesterol Cal 10 5 - 40 mg/dL   LDL Chol Calc (NIH) 952 (H) 0 - 99 mg/dL  VITAMIN D  25 Hydroxy (Vit-D Deficiency, Fractures)   Collection Time: 11/17/23  8:29 AM  Result Value Ref Range    Vit D, 25-Hydroxy 33.1 30.0 - 100.0 ng/mL  Lyme Disease Serology w/Reflex   Collection Time: 11/17/23  8:29 AM  Result Value Ref Range   Lyme Total Antibody EIA Negative Negative  Spotted Fever Group Antibodies   Collection Time: 11/17/23  8:29 AM  Result Value Ref Range   Spotted Fever Group IgG <1:64 Neg:<1:64   Spotted Fever Group IgM <1:64 Neg:<1:64   Result Comment Comment       Assessment & Plan:   Problem List Items Addressed This Visit       Cardiovascular and Mediastinum   Primary hypertension - Primary   Under good control on current regimen. Continue current regimen. Continue to monitor. Call with any concerns. Refills given. Labs drawn today.        Relevant Medications   atorvastatin  (LIPITOR) 40 MG tablet   lisinopril  (ZESTRIL ) 5 MG tablet   Other Relevant Orders   Comprehensive metabolic panel with GFR     Other   Hyperlipidemia, unspecified   Under good control on current regimen. Continue current regimen. Continue to monitor. Call with any concerns. Refills given. Labs drawn today.        Relevant Medications   atorvastatin  (LIPITOR) 40 MG tablet   lisinopril  (ZESTRIL ) 5 MG tablet   Other Relevant Orders   Comprehensive metabolic panel with GFR   Lipid Panel w/o Chol/HDL Ratio   Other Visit Diagnoses       Hordeolum externum of right upper eyelid       Continue warm compresses. Will treat with erythromycin. Call with any concerns.        Follow up plan: Return in about 5 months (around 05/10/2024) for physical.

## 2023-12-10 LAB — COMPREHENSIVE METABOLIC PANEL WITH GFR
ALT: 16 IU/L (ref 0–32)
AST: 26 IU/L (ref 0–40)
Albumin: 4.5 g/dL (ref 3.8–4.8)
Alkaline Phosphatase: 60 IU/L (ref 44–121)
BUN/Creatinine Ratio: 14 (ref 12–28)
BUN: 12 mg/dL (ref 8–27)
Bilirubin Total: 0.5 mg/dL (ref 0.0–1.2)
CO2: 22 mmol/L (ref 20–29)
Calcium: 9.8 mg/dL (ref 8.7–10.3)
Chloride: 102 mmol/L (ref 96–106)
Creatinine, Ser: 0.87 mg/dL (ref 0.57–1.00)
Globulin, Total: 2 g/dL (ref 1.5–4.5)
Glucose: 76 mg/dL (ref 70–99)
Potassium: 4.2 mmol/L (ref 3.5–5.2)
Sodium: 140 mmol/L (ref 134–144)
Total Protein: 6.5 g/dL (ref 6.0–8.5)
eGFR: 70 mL/min/{1.73_m2} (ref >59)

## 2023-12-10 LAB — LIPID PANEL W/O CHOL/HDL RATIO
Cholesterol, Total: 191 mg/dL (ref 100–199)
HDL: 77 mg/dL (ref >39)
LDL Chol Calc (NIH): 102 mg/dL — ABNORMAL HIGH (ref 0–99)
Triglycerides: 63 mg/dL (ref 0–149)
VLDL Cholesterol Cal: 12 mg/dL (ref 5–40)

## 2023-12-15 ENCOUNTER — Ambulatory Visit: Payer: Self-pay | Admitting: Family Medicine

## 2023-12-17 NOTE — Progress Notes (Signed)
 Pt attended 10/18/2023 screening event with BP of 142/84 and blood sugar was 75. Pt noted at event that she does have a PCP. At event pt did not indicate any SDOH needs. Pt also noted that she is not a smoker and listed Medicare as her insurance at the event.   Per chart review pt does have a PCP, insurance, and is not a smoker. Pt's last appt with PCP was 12/09/2023 and has an upcoming appt on 06/09/2024. Pt does not indicate any SDOH needs at this time.  No additional pt f/u to be scheduled at this time per health equity protocol.

## 2023-12-23 ENCOUNTER — Ambulatory Visit: Admitting: Family Medicine

## 2024-01-02 ENCOUNTER — Ambulatory Visit
Admission: RE | Admit: 2024-01-02 | Discharge: 2024-01-02 | Disposition: A | Discharge status: Home / Self Care | Source: Ambulatory Visit | Attending: Emergency Medicine | Admitting: Emergency Medicine

## 2024-01-02 ENCOUNTER — Ambulatory Visit: Payer: Self-pay

## 2024-01-02 VITALS — BP 160/83 | HR 64 | Temp 97.9°F | Resp 14 | Ht 63.5 in | Wt 155.0 lb

## 2024-01-02 DIAGNOSIS — L03311 Cellulitis of abdominal wall: Secondary | ICD-10-CM | POA: Diagnosis not present

## 2024-01-02 DIAGNOSIS — B0229 Other postherpetic nervous system involvement: Secondary | ICD-10-CM

## 2024-01-02 DIAGNOSIS — B029 Zoster without complications: Secondary | ICD-10-CM | POA: Diagnosis not present

## 2024-01-02 MED ORDER — DOXYCYCLINE HYCLATE 100 MG PO CAPS: 100.0000 mg | ORAL_CAPSULE | Freq: Two times a day (BID) | ORAL | 0 refills | Status: AC

## 2024-01-02 NOTE — Telephone Encounter (Signed)
 Noted, can follow-up with her after UC visit.

## 2024-01-02 NOTE — Telephone Encounter (Signed)
 Patient is currently being seen by UC for possible shingles.

## 2024-01-02 NOTE — Discharge Instructions (Addendum)
 As we discussed, the rashes are a resolving shingles outbreak and the burning pain you are experiencing is inflammation of the nerve pathway where the shingles outbreak occurred.  You are reporting good pain relief with the exercise Tylenol and I would continue taking extra-strength Tylenol to help with the discomfort.  This pain may resolve in a matter of weeks or months or it may take longer.  If the pain becomes more bothersome or continues I would follow-up with your primary care provider as there are medications that we can put you on to help with the discomfort more long-term.  The lesion on your abdomen has some surrounding redness which is concerning for developing a soft tissue skin infection called cellulitis.  Take the doxycycline twice daily with food for 7 days to treat the developing cellulitis.  Be mindful that doxycycline does make you more sensitive to the sun so if you are outdoors for more than 15 minutes make sure that you are wearing sunscreen and you are reapplying sunscreen every 90 minutes.  Monitor the redness on your belly if becomes more red, the redness spreads, it becomes more hot, or you start running fevers you need to be reevaluated either by your primary care provider, here urgent care, or in the emergency department.

## 2024-01-02 NOTE — Telephone Encounter (Signed)
  FYI Only or Action Required?: FYI only for provider.  Patient was last seen in primary care on 12/09/2023 by Vicci Bouchard P, DO. Called Nurse Triage reporting Herpes Zoster. Symptoms began 10 days ago. Interventions attempted: OTC medications: Tylenol PM, Cortisone cream. Symptoms are: painful, burning red rash with 10 spots of red dots to right groin/waist/back gradually improving.  Triage Disposition: See Physician Within 24 Hours  Patient/caregiver understands and will follow disposition?: Yes                              Copied from CRM [id number]. Topic: Clinical - Medical Advice >> Jan 02, 2024  8:23 AM Silvana PARAS wrote: Reason for CRM: Shingles like rash from groin to her back that is burning. Pt has had shingles shot. Due to pain warm transfer to nurse. Reason for Disposition  [1] Shingles rash AND [2] onset > 72 hours ago (3 days)  Answer Assessment - Initial Assessment Questions No availability at [facility] til Monday, patient states she would like to be seen today. Patient does not know how to operate or login to mychart. Scheduled patient at urgent care in [place] for soonest available.   1. APPEARANCE of RASH: Describe the rash.      10 spots total with little red dots blotchy. Patient has a difficult time describing and states she can send a picture but doesn't have access to mychart.  2. LOCATION: Where is the rash located?      Right side. Groin up to belly button and around right side of waist to back.  3. ONSET: When did the rash start?      10 days; yesterday it was at its worst, gradually improved.  4. ITCHING: Does the rash itch? If Yes, ask: How bad is the itch?  (Scale 1-10; or mild, moderate, severe)     No.  5. PAIN: Does the rash hurt? If Yes, ask: How bad is the pain?  (Scale 0-10; or none, mild, moderate, severe)    - NONE (0): no pain    - MILD (1-3): doesn't interfere with normal activities     - MODERATE (4-7):  interferes with normal activities or awakens from sleep     - SEVERE (8-10): excruciating pain, unable to do any normal activities     5/10, burning.  6. OTHER SYMPTOMS: Do you have any other symptoms? (e.g., fever)     Patient states she thinks she had a fever yesterday but did not check her temperature.  7. PREGNANCY: Is there any chance you are pregnant? When was your last menstrual period?     N/A.  Protocols used: Shingles (Zoster)-A-AH

## 2024-01-02 NOTE — ED Provider Notes (Signed)
 [facility]    CSN: [id number] Arrival date & time: 01/02/24  1130      History   Chief Complaint Chief Complaint  Patient presents with   Rash    Appointment    HPI Angie Copeland is a 73 y.o. female.   HPI  73 year old female with past medical history significant for degenerative disc disease in the lumbar region, GERD, hyperlipidemia, osteoporosis, rosacea, allergic rhinitis, hypertension, and vitamin D  deficiency presents for evaluation of a rash on her right hip region that started 10 days ago.  She describes it as a burning sensation along the line between the rash in the front and the rash of the back.  She has been applying topical cortisone and taking extra Tylenol which has helped with the discomfort.  She denies fever.  Past Medical History:  Diagnosis Date   Allergy    DDD (degenerative disc disease), lumbar    Esophageal mass 2015   Found incidently on CXR- Saw ENT and for visualization and nothing was there.    GERD (gastroesophageal reflux disease)    Hyperlipidemia    Osteoporosis     Patient Active Problem List   Diagnosis Date Noted   Primary hypertension 11/17/2023   Acute pain of left knee 01/27/2023   Family history of colon cancer    Cecal polyp    Polyp of ascending colon    Vitamin D  deficiency 12/19/2016   Advance directive discussed with patient 12/17/2016   Rosacea 10/15/2016   Allergic rhinitis 10/15/2016   GE reflux 10/15/2016   Hyperlipidemia, unspecified 10/15/2016   Osteoporosis 10/15/2016   Fever blister 10/15/2016   Anxiety 10/15/2016   DDD (degenerative disc disease), lumbar 01/20/2012    Past Surgical History:  Procedure Laterality Date   COLONOSCOPY WITH PROPOFOL  N/A 02/06/2021   Procedure: COLONOSCOPY WITH PROPOFOL ;  Surgeon: Janalyn Keene NOVAK, MD;  Location: [facility] ENDOSCOPY;  Service: Endoscopy;  Laterality: N/A;   EYE SURGERY     cataract surgery    NOSE SURGERY     Cartilage removed from left side of nose     OB History   No obstetric history on file.      Home Medications    Prior to Admission medications   Medication Sig Start Date End Date Taking? Authorizing Provider  doxycycline (VIBRAMYCIN) 100 MG capsule Take 1 capsule (100 mg total) by mouth 2 (two) times daily for 7 days. 01/02/24 01/09/24 Yes Bernardino Ditch, NP  atorvastatin  (LIPITOR) 40 MG tablet Take 1 tablet (40 mg total) by mouth daily. 12/09/23   Johnson, Megan P, DO  cholecalciferol (VITAMIN D3) 25 MCG (1000 UNIT) tablet Take 1,000 Units by mouth daily.    [provider]  diclofenac  Sodium (VOLTAREN ) 1 % GEL Apply 4 g topically 4 (four) times daily. 05/19/23   Johnson, Megan P, DO  Glycerin-Polysorbate 80 (REFRESH DRY EYE THERAPY OP) Apply to eye.    [provider]  lisinopril  (ZESTRIL ) 5 MG tablet Take 1 tablet (5 mg total) by mouth daily. 12/09/23   Johnson, Megan P, DO  mupirocin  ointment (BACTROBAN ) 2 % Apply 1 Application topically 2 (two) times daily. 11/17/23   Johnson, Megan P, DO  triamcinolone (KENALOG) 0.025 % cream Apply topically. 08/30/22   [provider]    Family History Family History  Problem Relation Age of Onset   Heart disease Mother    Diabetes Mother    Hiatal hernia Mother    Arthritis Mother  Cancer Father 74       Colon   Colitis Sister    Irritable bowel syndrome Sister    Arthritis Sister    Diabetes Brother    Stroke Maternal Grandmother    Pneumonia Maternal Grandmother    Kidney failure Paternal Grandmother    Leukemia Paternal Grandfather    Cancer Paternal Grandfather        Leukemia   Heart disease Sister    Irregular heart beat Sister    Thyroid disease Sister     Social History Social History   Tobacco Use   Smoking status: Former    Current packs/day: 0.00    Types: Cigarettes    Quit date: 07/09/1971    Years since quitting: 52.5   Smokeless tobacco: Never  Vaping Use   Vaping status: Never Used  Substance Use Topics   Alcohol use: Yes     Comment: wine   Drug use: No     Allergies   Penicillins   Review of Systems Review of Systems  Constitutional:  Negative for fever.  Skin:  Positive for color change and rash.     Physical Exam Triage Vital Signs ED Triage Vitals  Encounter Vitals Group     BP 01/02/24 1148 (!) 160/83     Girls Systolic BP Percentile --      Girls Diastolic BP Percentile --      Boys Systolic BP Percentile --      Boys Diastolic BP Percentile --      Pulse Rate 01/02/24 1148 64     Resp 01/02/24 1148 14     Temp 01/02/24 1148 97.9 F (36.6 C)     Temp Source 01/02/24 1148 Oral     SpO2 01/02/24 1148 97 %     Weight 01/02/24 1146 154 lb 15.7 oz (70.3 kg)     Height 01/02/24 1146 5' 3.5 (1.613 m)     Head Circumference --      Peak Flow --      Pain Score 01/02/24 1146 5     Pain Loc --      Pain Education --      Exclude from Growth Chart --    No data found.  Updated Vital Signs BP (!) 160/83 (BP Location: Right Arm)   Pulse 64   Temp 97.9 F (36.6 C) (Oral)   Resp 14   Ht 5' 3.5 (1.613 m)   Wt 154 lb 15.7 oz (70.3 kg)   SpO2 97%   BMI 27.02 kg/m   Visual Acuity Right Eye Distance:   Left Eye Distance:   Bilateral Distance:    Right Eye Near:   Left Eye Near:    Bilateral Near:     Physical Exam Vitals and nursing note reviewed.  Constitutional:      Appearance: Normal appearance. She is not ill-appearing.  HENT:     Head: Normocephalic and atraumatic.   Skin:    General: Skin is warm and dry.     Capillary Refill: Capillary refill takes less than 2 seconds.     Findings: Erythema and rash present.   Neurological:     General: No focal deficit present.     Mental Status: She is alert and oriented to person, place, and time.      UC Treatments / Results  Labs (all labs ordered are listed, but only abnormal results are displayed) Labs Reviewed - No data to display  EKG   Radiology  No results found.  Procedures Procedures (including  critical care time)  Medications Ordered in UC Medications - No data to display  Initial Impression / Assessment and Plan / UC Course  I have reviewed the triage vital signs and the nursing notes.  Pertinent labs & imaging results that were available during my care of the patient were reviewed by me and considered in my medical decision making (see chart for details).   Patient is a pleasant, nontoxic-appearing 73 year old female presenting for evaluation of a rash on her right hip region that started 10 days ago.  She reports that she was on vacation when the rash started and she just got back.    As you can see in image above, the rashes are mostly dried up and consist of a cluster of erythematous vesicles.  The rash in the front of the abdomen has some surrounding erythema that is warm to touch and tender which is concerning for possible developing cellulitis.  I suspect that the patient and she has infection which is largely resolved and she has some residual herpetic neuropathy.  She is getting adequate pain relief from her Tylenol.  For the developing cellulitis I will start the patient on doxycycline 100 mg twice daily with food.  She has an allergy to penicillins and is unsure if she has ever been on a cephalosporin.  I cannot find documentation that she is ever been on a cephalosporin when reviewing records in epic.  I will also advise the patient that if the burning pain does not improve she may need to see her primary care provider and be started on medication for postherpetic neuralgia.   Final Clinical Impressions(s) / UC Diagnoses   Final diagnoses:  Herpes zoster without complication  Postherpetic neuralgia  Cellulitis of abdominal wall     Discharge Instructions      As we discussed, the rashes are a resolving shingles outbreak and the burning pain you are experiencing is inflammation of the nerve pathway where the shingles outbreak occurred.  You are reporting good  pain relief with the exercise Tylenol and I would continue taking extra-strength Tylenol to help with the discomfort.  This pain may resolve in a matter of weeks or months or it may take longer.  If the pain becomes more bothersome or continues I would follow-up with your primary care provider as there are medications that we can put you on to help with the discomfort more long-term.  The lesion on your abdomen has some surrounding redness which is concerning for developing a soft tissue skin infection called cellulitis.  Take the doxycycline twice daily with food for 7 days to treat the developing cellulitis.  Be mindful that doxycycline does make you more sensitive to the sun so if you are outdoors for more than 15 minutes make sure that you are wearing sunscreen and you are reapplying sunscreen every 90 minutes.  Monitor the redness on your belly if becomes more red, the redness spreads, it becomes more hot, or you start running fevers you need to be reevaluated either by your primary care provider, here urgent care, or in the emergency department.     ED Prescriptions     Medication Sig Dispense Auth. Provider   doxycycline (VIBRAMYCIN) 100 MG capsule Take 1 capsule (100 mg total) by mouth 2 (two) times daily for 7 days. 14 capsule Bernardino Ditch, NP      PDMP not reviewed this encounter.   Bernardino Ditch, NP 01/02/24 1217

## 2024-01-02 NOTE — ED Triage Notes (Signed)
 Patient c/o rash on her right groin, right hip and right lower back that started 10 days ago.  Patient describes it as burning.

## 2024-02-21 ENCOUNTER — Other Ambulatory Visit: Payer: Self-pay | Admitting: Family Medicine

## 2024-02-24 NOTE — Telephone Encounter (Signed)
 Refused Lipitor 40 mg because it's being requested too soon.  12/09/2023 #90, 1 refill was given.

## 2024-05-07 DIAGNOSIS — H26493 Other secondary cataract, bilateral: Secondary | ICD-10-CM | POA: Diagnosis not present

## 2024-05-07 DIAGNOSIS — H43813 Vitreous degeneration, bilateral: Secondary | ICD-10-CM | POA: Diagnosis not present

## 2024-05-07 DIAGNOSIS — H04123 Dry eye syndrome of bilateral lacrimal glands: Secondary | ICD-10-CM | POA: Diagnosis not present

## 2024-05-12 ENCOUNTER — Telehealth: Payer: Self-pay

## 2024-05-12 NOTE — Telephone Encounter (Signed)
 Copied from CRM [id number]. Topic: Clinical - Medical Advice >> May 12, 2024  8:59 AM Wess RAMAN wrote: Reason for CRM: Patient stated she saw online that atorvastatin  (LIPITOR) 40 MG tablet has a recall. She is wondering if she should continue to take it.   Callback #: [phone]

## 2024-05-12 NOTE — Telephone Encounter (Signed)
 Routing to provider to advise. Should patient contact her pharmacy to see if the lot number on her medication was one of the ones that are on the recall?

## 2024-05-12 NOTE — Telephone Encounter (Signed)
 Please let patient know she should contact her pharmacy to find out if the medication she is taking is from one of the manufacturer's that have recalled the medication.

## 2024-05-13 NOTE — Telephone Encounter (Signed)
 Tried calling patient. No answer and no VM available. Will try to call again.   OK for E2C2 to give message to the patient if she calls back.

## 2024-06-06 ENCOUNTER — Other Ambulatory Visit: Payer: Self-pay | Admitting: Family Medicine

## 2024-06-09 ENCOUNTER — Ambulatory Visit (INDEPENDENT_AMBULATORY_CARE_PROVIDER_SITE_OTHER): Admitting: Family Medicine

## 2024-06-09 ENCOUNTER — Encounter: Payer: Self-pay | Admitting: Family Medicine

## 2024-06-09 VITALS — BP 124/73 | HR 65 | Temp 97.7°F | Ht 63.5 in | Wt 157.0 lb

## 2024-06-09 DIAGNOSIS — F419 Anxiety disorder, unspecified: Secondary | ICD-10-CM | POA: Diagnosis not present

## 2024-06-09 DIAGNOSIS — E782 Mixed hyperlipidemia: Secondary | ICD-10-CM | POA: Diagnosis not present

## 2024-06-09 DIAGNOSIS — E559 Vitamin D deficiency, unspecified: Secondary | ICD-10-CM

## 2024-06-09 DIAGNOSIS — I10 Essential (primary) hypertension: Secondary | ICD-10-CM | POA: Diagnosis not present

## 2024-06-09 DIAGNOSIS — Z1231 Encounter for screening mammogram for malignant neoplasm of breast: Secondary | ICD-10-CM

## 2024-06-09 DIAGNOSIS — Z780 Asymptomatic menopausal state: Secondary | ICD-10-CM | POA: Diagnosis not present

## 2024-06-09 DIAGNOSIS — Z23 Encounter for immunization: Secondary | ICD-10-CM | POA: Diagnosis not present

## 2024-06-09 DIAGNOSIS — Z0000 Encounter for general adult medical examination without abnormal findings: Secondary | ICD-10-CM

## 2024-06-09 LAB — MICROALBUMIN, URINE WAIVED
Creatinine, Urine Waived: 50 mg/dL (ref 10–300)
Microalb, Ur Waived: 30 mg/L — ABNORMAL HIGH (ref 0–19)

## 2024-06-09 MED ORDER — LISINOPRIL 5 MG PO TABS: 5.0000 mg | ORAL_TABLET | Freq: Every day | ORAL | 1 refills | Status: AC

## 2024-06-09 MED ORDER — ATORVASTATIN CALCIUM 40 MG PO TABS: 40.0000 mg | ORAL_TABLET | Freq: Every day | ORAL | 1 refills | Status: AC

## 2024-06-09 NOTE — Progress Notes (Signed)
 Chief Complaint  Patient presents with   Medicare Wellness     Subjective:   Angie Copeland is a 73 y.o. female who presents for a Medicare Annual Wellness Visit.  Visit info / Clinical Intake: Medicare Wellness Visit Type:: Subsequent Annual Wellness Visit Persons participating in visit and providing information:: patient Medicare Wellness Visit Mode:: In-person (required for WTM) Interpreter Needed?: No Living arrangements:: (!) lives alone Patient's Overall Health Status Rating: very good Typical amount of pain: none Does pain affect daily life?: no Are you currently prescribed opioids?: no  Dietary Habits and Nutritional Risks How many meals a day?: 3 Eats fruit and vegetables daily?: yes Most meals are obtained by: preparing own meals; eating out In the last 2 weeks, have you had any of the following?: none Diabetic:: no  Functional Status Activities of Daily Living (to include ambulation/medication): Independent Ambulation: Independent Medication Administration: Independent Manage your own finances?: yes Primary transportation is: driving Concerns about hearing?: no  Fall Screening Falls in the past year?: 0 Number of falls in past year: 0 Was there an injury with Fall?: 0 Fall Risk Category Calculator: 0 Patient Fall Risk Level: Low Fall Risk  Fall Risk Patient at Risk for Falls Due to: No Fall Risks Fall risk Follow up: Falls evaluation completed  Home and Transportation Safety: All rugs have non-skid backing?: yes All stairs or steps have railings?: yes Grab bars in the bathtub or shower?: yes Have non-skid surface in bathtub or shower?: yes Good home lighting?: yes Regular seat belt use?: yes Hospital stays in the last year:: no  Cognitive Assessment Difficulty concentrating, remembering, or making decisions? : no Will 6CIT or Mini Cog be Completed: no 6CIT or Mini Cog Declined: patient alert, oriented, able to answer questions appropriately and  recall recent events  Advance Directives (For Healthcare) Does Patient Have a Medical Advance Directive?: Yes Does patient want to make changes to medical advance directive?: No - Patient declined Type of Advance Directive: Healthcare Power of [place]; Living will Copy of Healthcare Power of Attorney in Chart?: No - copy requested Copy of Living Will in Chart?: No - copy requested  Reviewed/Updated  Reviewed/Updated: Reviewed All (Medical, Surgical, Family, Medications, Allergies, Care Teams, Patient Goals)    Allergies (verified) Penicillins   Current Medications (verified) Outpatient Encounter Medications as of 06/09/2024  Medication Sig   atorvastatin  (LIPITOR) 40 MG tablet Take 1 tablet (40 mg total) by mouth daily.   cholecalciferol (VITAMIN D3) 25 MCG (1000 UNIT) tablet Take 1,000 Units by mouth daily.   diclofenac  Sodium (VOLTAREN ) 1 % GEL Apply 4 g topically 4 (four) times daily.   Glycerin-Polysorbate 80 (REFRESH DRY EYE THERAPY OP) Apply to eye.   lisinopril  (ZESTRIL ) 5 MG tablet Take 1 tablet (5 mg total) by mouth daily.   mupirocin  ointment (BACTROBAN ) 2 % Apply 1 Application topically 2 (two) times daily.   triamcinolone (KENALOG) 0.025 % cream Apply topically.   No facility-administered encounter medications on file as of 06/09/2024.    History: Past Medical History:  Diagnosis Date   Allergy    DDD (degenerative disc disease), lumbar    Esophageal mass 2015   Found incidently on CXR- Saw ENT and for visualization and nothing was there.    GERD (gastroesophageal reflux disease)    Hyperlipidemia    Osteoporosis    Past Surgical History:  Procedure Laterality Date   COLONOSCOPY WITH PROPOFOL  N/A 02/06/2021   Procedure: COLONOSCOPY WITH PROPOFOL ;  Surgeon: Janalyn Keene NOVAK,  MD;  Location: [facility] ENDOSCOPY;  Service: Endoscopy;  Laterality: N/A;   EYE SURGERY     cataract surgery    NOSE SURGERY     Cartilage removed from left side of nose   Family History   Problem Relation Age of Onset   Heart disease Mother    Diabetes Mother    Hiatal hernia Mother    Arthritis Mother    Cancer Father 15       Colon   Colitis Sister    Irritable bowel syndrome Sister    Arthritis Sister    Diabetes Brother    Stroke Maternal Grandmother    Pneumonia Maternal Grandmother    Kidney failure Paternal Grandmother    Leukemia Paternal Grandfather    Cancer Paternal Grandfather        Leukemia   Heart disease Sister    Irregular heart beat Sister    Thyroid disease Sister    Social History   Occupational History   Not on file  Tobacco Use   Smoking status: Former    Current packs/day: 0.00    Types: Cigarettes    Quit date: 07/09/1971    Years since quitting: 52.9   Smokeless tobacco: Never  Vaping Use   Vaping status: Never Used  Substance and Sexual Activity   Alcohol use: Yes    Comment: wine   Drug use: No   Sexual activity: Never   Tobacco Counseling Counseling given: Not Answered  SDOH Screenings   Food Insecurity: No Food Insecurity (06/09/2024)  Housing: Low Risk  (06/09/2024)  Transportation Needs: No Transportation Needs (06/09/2024)  Utilities: Not At Risk (06/09/2024)  Alcohol Screen: Low Risk  (06/09/2024)  Depression (PHQ2-9): Low Risk  (06/09/2024)  Financial Resource Strain: Low Risk  (06/09/2024)  Physical Activity: Sufficiently Active (06/09/2024)  Social Connections: Moderately Integrated (06/09/2024)  Stress: No Stress Concern Present (06/09/2024)  Tobacco Use: Medium Risk (06/09/2024)  Health Literacy: Adequate Health Literacy (06/09/2024)   See flowsheets for full screening details  Depression Screen PHQ 2 & 9 Depression Scale- Over the past 2 weeks, how often have you been bothered by any of the following problems? Little interest or pleasure in doing things: 0 Feeling down, depressed, or hopeless (PHQ Adolescent also includes...irritable): 0 PHQ-2 Total Score: 0 Trouble falling or staying asleep, or sleeping too  much: 0 Feeling tired or having little energy: 0 Poor appetite or overeating (PHQ Adolescent also includes...weight loss): 0 Feeling bad about yourself - or that you are a failure or have let yourself or your family down: 0 Trouble concentrating on things, such as reading the newspaper or watching television (PHQ Adolescent also includes...like school work): 0 Moving or speaking so slowly that other people could have noticed. Or the opposite - being so fidgety or restless that you have been moving around a lot more than usual: 0 Thoughts that you would be better off dead, or of hurting yourself in some way: 0 PHQ-9 Total Score: 0 If you checked off any problems, how difficult have these problems made it for you to do your work, take care of things at home, or get along with other people?: Not difficult at all     Goals Addressed   None          Objective:    Today's Vitals   06/09/24 0811 06/09/24 0812  BP: 124/73   Pulse: 65   Temp: 97.7 F (36.5 C)   TempSrc: Oral   SpO2: 99%  Weight: 157 lb (71.2 kg)   Height: 5' 3.5 (1.613 m)   PainSc: 0-No pain 0-No pain   Body mass index is 27.38 kg/m.  Hearing/Vision screen No results found. Immunizations and Health Maintenance Health Maintenance  Topic Date Due   Bone Density Scan  12/27/2023   Influenza Vaccine  02/06/2024   Medicare Annual Wellness (AWV)  05/18/2024   Zoster Vaccines- Shingrix (1 of 2) 09/07/2024 (Originally 05/30/1999)   Colonoscopy  02/06/2026   DTaP/Tdap/Td (3 - Td or Tdap) 05/17/2032   Pneumococcal Vaccine: 50+ Years  Completed   Hepatitis C Screening  Completed   Meningococcal B Vaccine  Aged Out   Mammogram  Discontinued   COVID-19 Vaccine  Discontinued        Assessment/Plan:  This is a routine wellness examination for Angie Copeland.  Patient Care Team: Vicci Duwaine SQUIBB, DO as PCP - General (Family Medicine)  I have personally reviewed and noted the following in the patient's chart:   Medical  and social history Use of alcohol, tobacco or illicit drugs  Current medications and supplements including opioid prescriptions. Functional ability and status Nutritional status Physical activity Advanced directives List of other physicians Hospitalizations, surgeries, and ER visits in previous 12 months Vitals Screenings to include cognitive, depression, and falls Referrals and appointments  No orders of the defined types were placed in this encounter.  In addition, I have reviewed and discussed with patient certain preventive protocols, quality metrics, and best practice recommendations. A written personalized care plan for preventive services as well as general preventive health recommendations were provided to patient.   Duwaine Vicci, DO   06/09/2024   Return in about 6 months (around 12/08/2024).  After Visit Summary: (In Person-Printed) AVS printed and given to the patient

## 2024-06-09 NOTE — Assessment & Plan Note (Signed)
Doing well not on medicine. Continue to monitor. Call with any concerns.

## 2024-06-09 NOTE — Telephone Encounter (Signed)
 Duplicate request, refilled 06/09/24.  Requested Prescriptions  Pending Prescriptions Disp Refills   lisinopril  (ZESTRIL ) 5 MG tablet [Pharmacy Med Name: LISINOPRIL  5MG  TABLETS] 90 tablet 1    Sig: TAKE 1 TABLET(5 MG) BY MOUTH DAILY     Cardiovascular:  ACE Inhibitors Failed - 06/09/2024  2:29 PM      Failed - Cr in normal range and within 180 days    Creatinine, Ser  Date Value Ref Range Status  12/09/2023 0.87 0.57 - 1.00 mg/dL Final         Failed - K in normal range and within 180 days    Potassium  Date Value Ref Range Status  12/09/2023 4.2 3.5 - 5.2 mmol/L Final         Passed - Patient is not pregnant      Passed - Last BP in normal range    BP Readings from Last 1 Encounters:  06/09/24 124/73         Passed - Valid encounter within last 6 months    Recent Outpatient Visits           Today Primary hypertension   [place] [facility] [place], [place], DO   6 months ago Primary hypertension   [facility] Mol[facility] [place], Megan P, DO   6 months ago Mixed hyperlipidemia   [place] [facility] [place], [place], DO

## 2024-06-09 NOTE — Assessment & Plan Note (Signed)
 Rechecking labs today. Await results. Treat as needed.

## 2024-06-09 NOTE — Patient Instructions (Addendum)
 Preventative Services:  Health Risk Assessment and Personalized Prevention Plan: Done today Bone Mass Measurements: Ordered today Breast Cancer Screening: Ordered today CVD Screening: Done today Cervical Cancer Screening: N/A Colon Cancer Screening: N/A Depression Screening: Done today Diabetes Screening: Done today Glaucoma Screening: see your eye doctor Hepatitis B vaccine: N/A Hepatitis C screening: up to date HIV Screening: up to date Flu Vaccine: given today Lung cancer Screening: N/A Obesity Screening: done today Pneumonia Vaccines (2): up to date STI Screening: N/A  Angie Copeland,  Thank you for taking the time for your Medicare Wellness Visit. I appreciate your continued commitment to your health goals. Please review the care plan we discussed, and feel free to reach out if I can assist you further.  Please note that Annual Wellness Visits do not include a physical exam. Some assessments may be limited, especially if the visit was conducted virtually. If needed, we may recommend an in-person follow-up with your provider.  Ongoing Care Seeing your primary care provider every 3 to 6 months helps us  monitor your health and provide consistent, personalized care.   Referrals If a referral was made during today's visit and you haven't received any updates within two weeks, please contact the referred provider directly to check on the status.  Recommended Screenings:  Health Maintenance  Topic Date Due   Zoster (Shingles) Vaccine (1 of 2) Never done   Osteoporosis screening with Bone Density Scan  12/27/2023   Flu Shot  02/06/2024   COVID-19 Vaccine (8 - 2025-26 season) 03/08/2024   Medicare Annual Wellness Visit  05/18/2024   Colon Cancer Screening  02/06/2026   DTaP/Tdap/Td vaccine (3 - Td or Tdap) 05/17/2032   Pneumococcal Vaccine for age over 34  Completed   Hepatitis C Screening  Completed   Meningitis B Vaccine  Aged Out   Breast Cancer Screening  Discontinued        06/09/2024    8:12 AM  Advanced Directives  Does Patient Have a Medical Advance Directive? Yes  Type of Estate Agent of [place];Living will  Does patient want to make changes to medical advance directive? No - Patient declined  Copy of Healthcare Power of Attorney in Chart? No - copy requested    Vision: Annual vision screenings are recommended for early detection of glaucoma, cataracts, and diabetic retinopathy. These exams can also reveal signs of chronic conditions such as diabetes and high blood pressure.  Dental: Annual dental screenings help detect early signs of oral cancer, gum disease, and other conditions linked to overall health, including heart disease and diabetes.  Please see the attached documents for additional preventive care recommendations.   Please call to schedule your mammogram and/or bone density: [facility]  Address: [address], [place], [place] [address] Phone: [phone]  [facility] [address] [place],  [place]  [address] Phone: [phone]

## 2024-06-09 NOTE — Assessment & Plan Note (Signed)
 Under good control on current regimen. Continue current regimen. Continue to monitor. Call with any concerns. Refills given. Labs drawn today.

## 2024-06-09 NOTE — Progress Notes (Signed)
 BP 124/73   Pulse 65   Temp 97.7 F (36.5 C) (Oral)   Ht 5' 3.5 (1.613 m)   Wt 157 lb (71.2 kg)   SpO2 99%   BMI 27.38 kg/m    Subjective:    Patient ID: Angie Copeland, female    DOB: [birth year], 73 y.o.   MRN: [id number]  HPI: Angie Copeland is a 73 y.o. female presenting on 06/09/2024 for comprehensive medical examination. Current medical complaints include:  HYPERTENSION / HYPERLIPIDEMIA Satisfied with current treatment? yes Duration of hypertension: chronic BP monitoring frequency: not checking BP medication side effects: no Past BP meds: lisinopril  Duration of hyperlipidemia: chronic Cholesterol medication side effects: no Cholesterol supplements: none Past cholesterol medications: atorvastatin  Medication compliance: excellent compliance Aspirin: no Recent stressors: no Recurrent headaches: no Visual changes: no Palpitations: no Dyspnea: no Chest pain: no Lower extremity edema: no Dizzy/lightheaded: no  Menopausal Symptoms: no  Functional Status Survey: Is the patient deaf or have difficulty hearing?: No Does the patient have difficulty seeing, even when wearing glasses/contacts?: No Does the patient have difficulty concentrating, remembering, or making decisions?: No Does the patient have difficulty walking or climbing stairs?: No Does the patient have difficulty dressing or bathing?: No Does the patient have difficulty doing errands alone such as visiting a doctor's office or shopping?: No     06/09/2024    8:29 AM 06/09/2024    8:12 AM 12/09/2023    9:21 AM 11/17/2023    8:55 AM 05/19/2023    8:30 AM  Fall Risk   Falls in the past year? 0 0 1 1 0  Number falls in past yr:  0 0 0 0  Injury with Fall? 0 0 0  1  0   Risk for fall due to : No Fall Risks No Fall Risks No Fall Risks History of fall(s) No Fall Risks  Follow up Falls evaluation completed Falls evaluation completed Falls evaluation completed Falls evaluation completed Falls evaluation completed      Data saved with a previous flowsheet row definition    Depression Screen    06/09/2024    8:28 AM 06/09/2024    8:09 AM 12/09/2023    9:21 AM 11/17/2023    8:55 AM 05/19/2023    8:31 AM  Depression screen PHQ 2/9  Decreased Interest 0 0 0 0 0  Down, Depressed, Hopeless 0 0 0 0 0  PHQ - 2 Score 0 0 0 0 0  Altered sleeping 0  0 1 1  Tired, decreased energy 0  0 1 1  Change in appetite 0  0 1 1  Feeling bad or failure about yourself  0  0 0 0  Trouble concentrating 0  0 0 0  Moving slowly or fidgety/restless 0  0 0 1  Suicidal thoughts   0 0 0  PHQ-9 Score 0  0  3  4   Difficult doing work/chores Not difficult at all   Not difficult at all Not difficult at all     Data saved with a previous flowsheet row definition     Advanced Directives Does patient have a HCPOA?    no If yes, name and contact information:  Does patient have a living will or MOST form?  no  Past Medical History:  Past Medical History:  Diagnosis Date   Allergy    DDD (degenerative disc disease), lumbar    Esophageal mass 2015   Found incidently on CXR- Saw ENT and  for visualization and nothing was there.    GERD (gastroesophageal reflux disease)    Hyperlipidemia    Osteoporosis     Surgical History:  Past Surgical History:  Procedure Laterality Date   COLONOSCOPY WITH PROPOFOL  N/A 02/06/2021   Procedure: COLONOSCOPY WITH PROPOFOL ;  Surgeon: Janalyn Keene NOVAK, MD;  Location: [facility] ENDOSCOPY;  Service: Endoscopy;  Laterality: N/A;   EYE SURGERY     cataract surgery    NOSE SURGERY     Cartilage removed from left side of nose    Medications:  Current Outpatient Medications on File Prior to Visit  Medication Sig   cholecalciferol (VITAMIN D3) 25 MCG (1000 UNIT) tablet Take 1,000 Units by mouth daily.   diclofenac  Sodium (VOLTAREN ) 1 % GEL Apply 4 g topically 4 (four) times daily.   Glycerin-Polysorbate 80 (REFRESH DRY EYE THERAPY OP) Apply to eye.   mupirocin  ointment (BACTROBAN ) 2 % Apply  1 Application topically 2 (two) times daily.   triamcinolone (KENALOG) 0.025 % cream Apply topically.   No current facility-administered medications on file prior to visit.    Allergies:  Allergies  Allergen Reactions   Penicillins Rash    Social History:  Social History   Socioeconomic History   Marital status: Divorced    Spouse name: Not on file   Number of children: Not on file   Years of education: Not on file   Highest education level: Bachelor's degree (e.g., BA, AB, BS)  Occupational History   Not on file  Tobacco Use   Smoking status: Former    Current packs/day: 0.00    Types: Cigarettes    Quit date: 07/09/1971    Years since quitting: 52.9   Smokeless tobacco: Never  Vaping Use   Vaping status: Never Used  Substance and Sexual Activity   Alcohol use: Yes    Comment: wine   Drug use: No   Sexual activity: Never  Other Topics Concern   Not on file  Social History Narrative   Not on file   Social Drivers of Health   Financial Resource Strain: Low Risk  (06/09/2024)   Overall Financial Resource Strain (CARDIA)    Difficulty of Paying Living Expenses: Not hard at all  Food Insecurity: No Food Insecurity (06/09/2024)   Hunger Vital Sign    Worried About Running Out of Food in the Last Year: Never true    Ran Out of Food in the Last Year: Never true  Transportation Needs: No Transportation Needs (06/09/2024)   PRAPARE - Administrator, Civil Service (Medical): No    Lack of Transportation (Non-Medical): No  Physical Activity: Sufficiently Active (06/09/2024)   Exercise Vital Sign    Days of Exercise per Week: 5 days    Minutes of Exercise per Session: 30 min  Stress: No Stress Concern Present (06/09/2024)   [employer] of Occupational Health - Occupational Stress Questionnaire    Feeling of Stress: Not at all  Social Connections: Moderately Integrated (06/09/2024)   Social Connection and Isolation Panel    Frequency of Communication with  Friends and Family: Twice a week    Frequency of Social Gatherings with Friends and Family: Three times a week    Attends Religious Services: More than 4 times per year    Active Member of Clubs or Organizations: Yes    Attends Banker Meetings: More than 4 times per year    Marital Status: Divorced  Intimate Partner Violence: Not At Risk (06/09/2024)  Humiliation, Afraid, Rape, and Kick questionnaire    Fear of Current or Ex-Partner: No    Emotionally Abused: No    Physically Abused: No    Sexually Abused: No   Social History   Tobacco Use  Smoking Status Former   Current packs/day: 0.00   Types: Cigarettes   Quit date: 07/09/1971   Years since quitting: 52.9  Smokeless Tobacco Never   Social History   Substance and Sexual Activity  Alcohol Use Yes   Comment: wine    Family History:  Family History  Problem Relation Age of Onset   Heart disease Mother    Diabetes Mother    Hiatal hernia Mother    Arthritis Mother    Cancer Father 35       Colon   Colitis Sister    Irritable bowel syndrome Sister    Arthritis Sister    Diabetes Brother    Stroke Maternal Grandmother    Pneumonia Maternal Grandmother    Kidney failure Paternal Grandmother    Leukemia Paternal Grandfather    Cancer Paternal Grandfather        Leukemia   Heart disease Sister    Irregular heart beat Sister    Thyroid disease Sister     Past medical history, surgical history, medications, allergies, family history and social history reviewed with patient today and changes made to appropriate areas of the chart.   Review of Systems  Constitutional: Negative.   HENT: Negative.    Eyes: Negative.   Respiratory: Negative.    Cardiovascular:  Positive for leg swelling. Negative for chest pain, palpitations, orthopnea, claudication and PND.  Gastrointestinal: Negative.   Genitourinary: Negative.   Musculoskeletal: Negative.   Skin:  Positive for rash (occasionally). Negative for  itching.  Neurological:  Positive for tingling (occasionally when she wakes up). Negative for dizziness, tremors, sensory change, speech change, focal weakness, seizures, loss of consciousness, weakness and headaches.  Endo/Heme/Allergies: Negative.   Psychiatric/Behavioral: Negative.      All other ROS negative except what is listed above and in the HPI.      Objective:    BP 124/73   Pulse 65   Temp 97.7 F (36.5 C) (Oral)   Ht 5' 3.5 (1.613 m)   Wt 157 lb (71.2 kg)   SpO2 99%   BMI 27.38 kg/m   Wt Readings from Last 3 Encounters:  06/09/24 157 lb (71.2 kg)  01/02/24 154 lb 15.7 oz (70.3 kg)  12/09/23 155 lb (70.3 kg)    No results found.  Physical Exam Vitals and nursing note reviewed.  Constitutional:      General: She is not in acute distress.    Appearance: Normal appearance. She is not ill-appearing, toxic-appearing or diaphoretic.  HENT:     Head: Normocephalic and atraumatic.     Right Ear: Tympanic membrane, ear canal and external ear normal. There is no impacted cerumen.     Left Ear: Tympanic membrane, ear canal and external ear normal. There is no impacted cerumen.     Nose: Nose normal. No congestion or rhinorrhea.     Mouth/Throat:     Mouth: Mucous membranes are moist.     Pharynx: Oropharynx is clear. No oropharyngeal exudate or posterior oropharyngeal erythema.  Eyes:     General: No scleral icterus.       Right eye: No discharge.        Left eye: No discharge.     Extraocular Movements: Extraocular movements intact.  Conjunctiva/sclera: Conjunctivae normal.     Pupils: Pupils are equal, round, and reactive to light.  Neck:     Vascular: No carotid bruit.  Cardiovascular:     Rate and Rhythm: Normal rate and regular rhythm.     Pulses: Normal pulses.     Heart sounds: No murmur heard.    No friction rub. No gallop.  Pulmonary:     Effort: Pulmonary effort is normal. No respiratory distress.     Breath sounds: Normal breath sounds. No  stridor. No wheezing, rhonchi or rales.  Chest:     Chest wall: No tenderness.  Abdominal:     General: Abdomen is flat. Bowel sounds are normal. There is no distension.     Palpations: Abdomen is soft. There is no mass.     Tenderness: There is no abdominal tenderness. There is no right CVA tenderness, left CVA tenderness, guarding or rebound.     Hernia: No hernia is present.  Genitourinary:    Comments: Breast and pelvic exams deferred with shared decision making Musculoskeletal:        General: No swelling, tenderness, deformity or signs of injury.     Cervical back: Normal range of motion and neck supple. No rigidity. No muscular tenderness.     Right lower leg: No edema.     Left lower leg: No edema.  Lymphadenopathy:     Cervical: No cervical adenopathy.  Skin:    General: Skin is warm and dry.     Capillary Refill: Capillary refill takes less than 2 seconds.     Coloration: Skin is not jaundiced or pale.     Findings: No bruising, erythema, lesion or rash.  Neurological:     General: No focal deficit present.     Mental Status: She is alert and oriented to person, place, and time. Mental status is at baseline.     Cranial Nerves: No cranial nerve deficit.     Sensory: No sensory deficit.     Motor: No weakness.     Coordination: Coordination normal.     Gait: Gait normal.     Deep Tendon Reflexes: Reflexes normal.  Psychiatric:        Mood and Affect: Mood normal.        Behavior: Behavior normal.        Thought Content: Thought content normal.        Judgment: Judgment normal.        05/19/2023    8:42 AM 05/17/2022   12:11 PM 12/19/2017   10:43 AM 12/17/2016   10:47 AM  6CIT Screen  What Year? 0 points 0 points 0 points 0 points  What month? 0 points 0 points 0 points 0 points  What time? 0 points 0 points 0 points 0 points  Count back from 20 0 points 0 points 0 points 2 points  Months in reverse 0 points 0 points 0 points 0 points  Repeat phrase 0 points 0  points 0 points 0 points  Total Score 0 points 0 points 0 points 2 points    Results for orders placed or performed in visit on 06/09/24  Microalbumin, Urine Waived   Collection Time: 06/09/24  8:51 AM  Result Value Ref Range   Microalb, Ur Waived 30 (H) 0 - 19 mg/L   Creatinine, Urine Waived 50 10 - 300 mg/dL   Microalb/Creat Ratio 30-300 (H) <30 mg/g      Assessment & Plan:   Problem List Items  Addressed This Visit       Cardiovascular and Mediastinum   Primary hypertension   Under good control on current regimen. Continue current regimen. Continue to monitor. Call with any concerns. Refills given. Labs drawn today.       Relevant Medications   atorvastatin  (LIPITOR) 40 MG tablet   lisinopril  (ZESTRIL ) 5 MG tablet   Other Relevant Orders   Microalbumin, Urine Waived (Completed)   CBC with Differential/Platelet   Comprehensive metabolic panel with GFR   TSH     Other   Hyperlipidemia, unspecified   Under good control on current regimen. Continue current regimen. Continue to monitor. Call with any concerns. Refills given. Labs drawn today.        Relevant Medications   atorvastatin  (LIPITOR) 40 MG tablet   lisinopril  (ZESTRIL ) 5 MG tablet   Other Relevant Orders   CBC with Differential/Platelet   Comprehensive metabolic panel with GFR   Lipid Panel w/o Chol/HDL Ratio   Anxiety   Doing well not on medicine. Continue to monitor. Call with any concerns.       Relevant Orders   CBC with Differential/Platelet   Comprehensive metabolic panel with GFR   Vitamin D  deficiency   Rechecking labs today. Await results. Treat as needed.       Relevant Orders   CBC with Differential/Platelet   Comprehensive metabolic panel with GFR   VITAMIN D  25 Hydroxy (Vit-D Deficiency, Fractures)   Other Visit Diagnoses       Encounter for Medicare annual wellness exam    -  Primary   Preventative care discussed today as below.     Routine general medical examination at a health  care facility       Vaccines up to date. Screening labs checked today. Mammo and DEXA ordered today. Continue diet and exercise. Call with any concerns.     Encounter for screening mammogram for malignant neoplasm of breast       Mammogram ordered today.   Relevant Orders   MM 3D SCREENING MAMMOGRAM BILATERAL BREAST     Postmenopausal estrogen deficiency       DEXA ordered today.   Relevant Orders   DG Bone Density     Needs flu shot       Flu shot given today.   Relevant Orders   Flu vaccine HIGH DOSE PF(Fluzone Trivalent) (Completed)        Preventative Services:  Health Risk Assessment and Personalized Prevention Plan: Done today Bone Mass Measurements: Ordered today Breast Cancer Screening: Ordered today CVD Screening: Done today Cervical Cancer Screening: N/A Colon Cancer Screening: N/A Depression Screening: Done today Diabetes Screening: Done today Glaucoma Screening: see your eye doctor Hepatitis B vaccine: N/A Hepatitis C screening: up to date HIV Screening: up to date Flu Vaccine: given today Lung cancer Screening: N/A Obesity Screening: done today Pneumonia Vaccines (2): up to date STI Screening: N/A  Follow up plan: Return in about 6 months (around 12/08/2024).   LABORATORY TESTING:  - Pap smear: not applicable  IMMUNIZATIONS:   - Tdap: Tetanus vaccination status reviewed: last tetanus booster within 10 years. - Influenza: Administered today - Pneumovax: Up to date - Prevnar: Up to date - Zostavax vaccine: Refused  SCREENING: -Mammogram: Up to date  - Colonoscopy: Not applicable  - Bone Density: Ordered today   PATIENT COUNSELING:   Advised to take 1 mg of folate supplement per day if capable of pregnancy.   Sexuality: Discussed sexually transmitted diseases, partner selection,  use of condoms, avoidance of unintended pregnancy  and contraceptive alternatives.   Advised to avoid cigarette smoking.  I discussed with the patient that most people  either abstain from alcohol or drink within safe limits (<=14/week and <=4 drinks/occasion for males, <=7/weeks and <= 3 drinks/occasion for females) and that the risk for alcohol disorders and other health effects rises proportionally with the number of drinks per week and how often a drinker exceeds daily limits.  Discussed cessation/primary prevention of drug use and availability of treatment for abuse.   Diet: Encouraged to adjust caloric intake to maintain  or achieve ideal body weight, to reduce intake of dietary saturated fat and total fat, to limit sodium intake by avoiding high sodium foods and not adding table salt, and to maintain adequate dietary potassium and calcium  preferably from fresh fruits, vegetables, and low-fat dairy products.    stressed the importance of regular exercise  Injury prevention: Discussed safety belts, safety helmets, smoke detector, smoking near bedding or upholstery.   Dental health: Discussed importance of regular tooth brushing, flossing, and dental visits.    NEXT PREVENTATIVE PHYSICAL DUE IN 1 YEAR. Return in about 6 months (around 12/08/2024).

## 2024-06-10 ENCOUNTER — Ambulatory Visit: Payer: Self-pay | Admitting: Family Medicine

## 2024-06-10 LAB — COMPREHENSIVE METABOLIC PANEL WITH GFR
ALT: 25 IU/L (ref 0–32)
AST: 30 IU/L (ref 0–40)
Albumin: 4.8 g/dL (ref 3.8–4.8)
Alkaline Phosphatase: 62 IU/L (ref 49–135)
BUN/Creatinine Ratio: 23 (ref 12–28)
BUN: 23 mg/dL (ref 8–27)
Bilirubin Total: 0.6 mg/dL (ref 0.0–1.2)
CO2: 22 mmol/L (ref 20–29)
Calcium: 10.1 mg/dL (ref 8.7–10.3)
Chloride: 99 mmol/L (ref 96–106)
Creatinine, Ser: 1 mg/dL (ref 0.57–1.00)
Globulin, Total: 2.2 g/dL (ref 1.5–4.5)
Glucose: 81 mg/dL (ref 70–99)
Potassium: 5.4 mmol/L — ABNORMAL HIGH (ref 3.5–5.2)
Sodium: 137 mmol/L (ref 134–144)
Total Protein: 7 g/dL (ref 6.0–8.5)
eGFR: 59 mL/min/1.73 — ABNORMAL LOW (ref >59)

## 2024-06-10 LAB — VITAMIN D 25 HYDROXY (VIT D DEFICIENCY, FRACTURES): Vit D, 25-Hydroxy: 37.2 ng/mL (ref 30.0–100.0)

## 2024-06-10 LAB — CBC WITH DIFFERENTIAL/PLATELET
Basophils Absolute: 0.1 x10E3/uL (ref 0.0–0.2)
Basos: 1 %
EOS (ABSOLUTE): 0.1 x10E3/uL (ref 0.0–0.4)
Eos: 2 %
Hematocrit: 46.5 % (ref 34.0–46.6)
Hemoglobin: 15.3 g/dL (ref 11.1–15.9)
Immature Grans (Abs): 0 x10E3/uL (ref 0.0–0.1)
Immature Granulocytes: 0 %
Lymphocytes Absolute: 1.7 x10E3/uL (ref 0.7–3.1)
Lymphs: 33 %
MCH: 31.3 pg (ref 26.6–33.0)
MCHC: 32.9 g/dL (ref 31.5–35.7)
MCV: 95 fL (ref 79–97)
Monocytes Absolute: 0.5 x10E3/uL (ref 0.1–0.9)
Monocytes: 10 %
Neutrophils Absolute: 2.9 x10E3/uL (ref 1.4–7.0)
Neutrophils: 54 %
Platelets: 217 x10E3/uL (ref 150–450)
RBC: 4.89 x10E6/uL (ref 3.77–5.28)
RDW: 11.7 % (ref 11.7–15.4)
WBC: 5.3 x10E3/uL (ref 3.4–10.8)

## 2024-06-10 LAB — LIPID PANEL W/O CHOL/HDL RATIO
Cholesterol, Total: 204 mg/dL — ABNORMAL HIGH (ref 100–199)
HDL: 95 mg/dL (ref >39)
LDL Chol Calc (NIH): 97 mg/dL (ref 0–99)
Triglycerides: 66 mg/dL (ref 0–149)
VLDL Cholesterol Cal: 12 mg/dL (ref 5–40)

## 2024-06-10 LAB — TSH: TSH: 3.29 u[IU]/mL (ref 0.450–4.500)

## 2024-12-08 ENCOUNTER — Ambulatory Visit: Admitting: Family Medicine
